# Patient Record
Sex: Female | Born: 1945 | Race: White | Hispanic: No | Marital: Married | Smoking: Never smoker
Health system: Southern US, Community
[De-identification: ages and names within clinical notes are randomized; demographics above are authoritative.]

## PROBLEM LIST (undated history)

## (undated) DIAGNOSIS — D649 Anemia, unspecified: Secondary | ICD-10-CM

## (undated) DIAGNOSIS — K219 Gastro-esophageal reflux disease without esophagitis: Secondary | ICD-10-CM

## (undated) DIAGNOSIS — I10 Essential (primary) hypertension: Secondary | ICD-10-CM

## (undated) DIAGNOSIS — T7840XA Allergy, unspecified, initial encounter: Secondary | ICD-10-CM

## (undated) DIAGNOSIS — T148XXA Other injury of unspecified body region, initial encounter: Secondary | ICD-10-CM

## (undated) DIAGNOSIS — R011 Cardiac murmur, unspecified: Secondary | ICD-10-CM

## (undated) DIAGNOSIS — H269 Unspecified cataract: Secondary | ICD-10-CM

## (undated) DIAGNOSIS — M1990 Unspecified osteoarthritis, unspecified site: Secondary | ICD-10-CM

## (undated) DIAGNOSIS — I341 Nonrheumatic mitral (valve) prolapse: Secondary | ICD-10-CM

## (undated) HISTORY — PX: TUBAL LIGATION: SHX77

## (undated) HISTORY — DX: Allergy, unspecified, initial encounter: T78.40XA

## (undated) HISTORY — DX: Nonrheumatic mitral (valve) prolapse: I34.1

## (undated) HISTORY — PX: OTHER SURGICAL HISTORY: SHX169

## (undated) HISTORY — DX: Cardiac murmur, unspecified: R01.1

## (undated) HISTORY — PX: EYE SURGERY: SHX253

## (undated) HISTORY — PX: ANTERIOR CRUCIATE LIGAMENT REPAIR: SHX115

## (undated) HISTORY — DX: Unspecified cataract: H26.9

## (undated) HISTORY — PX: BREAST BIOPSY: SHX20

---

## 1997-10-07 ENCOUNTER — Other Ambulatory Visit: Admission: RE | Admit: 1997-10-07 | Discharge: 1997-10-07 | Payer: Self-pay | Admitting: Radiology

## 1997-10-17 ENCOUNTER — Ambulatory Visit (HOSPITAL_BASED_OUTPATIENT_CLINIC_OR_DEPARTMENT_OTHER): Admission: RE | Admit: 1997-10-17 | Discharge: 1997-10-17 | Payer: Self-pay

## 1998-11-13 ENCOUNTER — Other Ambulatory Visit: Admission: RE | Admit: 1998-11-13 | Discharge: 1998-11-13 | Payer: Self-pay | Admitting: *Deleted

## 1999-07-24 ENCOUNTER — Emergency Department (HOSPITAL_COMMUNITY): Admission: EM | Admit: 1999-07-24 | Discharge: 1999-07-24 | Payer: Self-pay | Admitting: Emergency Medicine

## 1999-07-26 ENCOUNTER — Emergency Department (HOSPITAL_COMMUNITY): Admission: EM | Admit: 1999-07-26 | Discharge: 1999-07-26 | Payer: Self-pay | Admitting: Emergency Medicine

## 1999-11-11 ENCOUNTER — Other Ambulatory Visit: Admission: RE | Admit: 1999-11-11 | Discharge: 1999-11-11 | Payer: Self-pay | Admitting: *Deleted

## 2000-11-23 ENCOUNTER — Other Ambulatory Visit: Admission: RE | Admit: 2000-11-23 | Discharge: 2000-11-23 | Payer: Self-pay | Admitting: *Deleted

## 2000-12-01 ENCOUNTER — Encounter: Payer: Self-pay | Admitting: Gastroenterology

## 2000-12-01 ENCOUNTER — Encounter: Admission: RE | Admit: 2000-12-01 | Discharge: 2000-12-01 | Payer: Self-pay | Admitting: Gastroenterology

## 2001-12-12 ENCOUNTER — Other Ambulatory Visit: Admission: RE | Admit: 2001-12-12 | Discharge: 2001-12-12 | Payer: Self-pay | Admitting: *Deleted

## 2003-02-14 ENCOUNTER — Other Ambulatory Visit: Admission: RE | Admit: 2003-02-14 | Discharge: 2003-02-14 | Payer: Self-pay | Admitting: *Deleted

## 2005-03-12 ENCOUNTER — Ambulatory Visit: Payer: Self-pay

## 2005-03-25 ENCOUNTER — Ambulatory Visit: Payer: Self-pay | Admitting: Cardiology

## 2005-03-25 ENCOUNTER — Ambulatory Visit (HOSPITAL_COMMUNITY): Admission: RE | Admit: 2005-03-25 | Discharge: 2005-03-25 | Payer: Self-pay | Admitting: Surgery

## 2005-03-25 ENCOUNTER — Encounter: Payer: Self-pay | Admitting: Cardiology

## 2005-06-10 ENCOUNTER — Ambulatory Visit: Payer: Self-pay | Admitting: Cardiology

## 2005-12-21 ENCOUNTER — Encounter: Payer: Self-pay | Admitting: Cardiovascular Disease

## 2005-12-21 ENCOUNTER — Ambulatory Visit: Payer: Self-pay

## 2005-12-28 ENCOUNTER — Ambulatory Visit: Payer: Self-pay | Admitting: Cardiology

## 2006-12-06 ENCOUNTER — Ambulatory Visit: Payer: Self-pay | Admitting: Cardiology

## 2006-12-06 ENCOUNTER — Ambulatory Visit: Payer: Self-pay

## 2006-12-06 ENCOUNTER — Encounter: Payer: Self-pay | Admitting: Cardiology

## 2007-12-12 ENCOUNTER — Ambulatory Visit: Payer: Self-pay

## 2007-12-12 ENCOUNTER — Ambulatory Visit: Payer: Self-pay | Admitting: Cardiology

## 2007-12-12 ENCOUNTER — Encounter: Payer: Self-pay | Admitting: Cardiology

## 2008-12-20 DIAGNOSIS — I080 Rheumatic disorders of both mitral and aortic valves: Secondary | ICD-10-CM

## 2008-12-20 DIAGNOSIS — I059 Rheumatic mitral valve disease, unspecified: Secondary | ICD-10-CM | POA: Insufficient documentation

## 2008-12-23 ENCOUNTER — Encounter: Payer: Self-pay | Admitting: Cardiology

## 2008-12-23 ENCOUNTER — Ambulatory Visit: Payer: Self-pay

## 2008-12-23 ENCOUNTER — Ambulatory Visit: Payer: Self-pay | Admitting: Cardiology

## 2008-12-23 DIAGNOSIS — E785 Hyperlipidemia, unspecified: Secondary | ICD-10-CM

## 2009-01-29 ENCOUNTER — Encounter: Admission: RE | Admit: 2009-01-29 | Discharge: 2009-01-29 | Payer: Self-pay | Admitting: Internal Medicine

## 2009-02-18 ENCOUNTER — Telehealth (INDEPENDENT_AMBULATORY_CARE_PROVIDER_SITE_OTHER): Payer: Self-pay | Admitting: *Deleted

## 2009-12-09 ENCOUNTER — Encounter: Payer: Self-pay | Admitting: Cardiology

## 2009-12-11 ENCOUNTER — Ambulatory Visit: Payer: Self-pay | Admitting: Internal Medicine

## 2009-12-11 ENCOUNTER — Ambulatory Visit: Payer: Self-pay | Admitting: Cardiology

## 2009-12-11 ENCOUNTER — Ambulatory Visit (HOSPITAL_COMMUNITY): Admission: RE | Admit: 2009-12-11 | Discharge: 2009-12-11 | Payer: Self-pay | Admitting: Cardiology

## 2009-12-11 ENCOUNTER — Encounter: Payer: Self-pay | Admitting: Cardiology

## 2010-04-20 ENCOUNTER — Encounter: Admission: RE | Admit: 2010-04-20 | Discharge: 2010-04-20 | Payer: Self-pay | Admitting: Internal Medicine

## 2010-07-07 NOTE — Assessment & Plan Note (Signed)
Summary: yearly/sl   Visit Type:  Follow-up Primary Provider:  Dr. Robert Bellow  CC:  Mitral Regurgitation.  History of Present Illness: The patient returns for yearly followup.  Since I last saw her she has been doing quite well. 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 This is a very aerobic activity. With this she denies any shortness of breath. She has no resting complaints such as PND or orthopnea. She has no palpitations, presyncope or syncope. She has no chest pressure, neck or arm discomfort. She has had no edema or swelling.   Current Medications (verified): 1)  Evista 60 Mg Tabs (Raloxifene Hcl) .... Daily 2)  Fosamax 70 Mg Tabs (Alendronate Sodium) .... Every Week 3)  Clobex 0.05 % Sham (Clobetasol Propionate) .... Daily 4)  Allegra 180 Mg Tabs (Fexofenadine Hcl) .Marland Kitchen.. 1 By Mouth Daily  Allergies (verified): No Known Drug Allergies  Past History:  Past Medical History:  1. Mitral valve prolapse with moderate mitral regurgitation.   Past Surgical History:   Breast biopsy of benign lesions.   Anterior cruciate ligament repair.      Review of Systems       As stated in the HPI and negative for all other systems.   Vital Signs:  Patient profile:   65 year old female Height:      69 inches Weight:      129 pounds BMI:     19.12 Pulse rate:   58 / minute Resp:     16 per minute BP sitting:   118 / 78  (right arm)  Vitals Entered By: Marrion Coy, CNA (December 11, 2009 11:46 AM)  Physical Exam  General:  Well developed, well nourished, in no acute distress. Head:  normocephalic and atraumatic Neck:  Neck supple, no JVD. No masses, thyromegaly or abnormal cervical nodes. Chest Wall:  no deformities or breast masses noted Lungs:  Clear bilaterally to auscultation and percussion. Heart:  S1 and S2 within normal limits, no S3, no S4, no clicks, no rubs, 2/6 systolic murmur heard at the right upper sternal border, no diastolic  murmurs Abdomen:  Bowel sounds positive; abdomen soft and non-tender without masses, organomegaly, or hernias noted. No hepatosplenomegaly. Msk:  Back normal, normal gait. Muscle strength and tone normal. Extremities:  No clubbing or cyanosis. Neurologic:  Alert and oriented x 3. Skin:  Intact without lesions or rashes. Cervical Nodes:  no significant adenopathy Psych:  Normal affect.   EKG  Procedure date:  12/11/2009  Findings:      sinus rhythm, rate 58, axis within normal limits, intervals within normal limits, no acute ST-T wave changes.  Impression & Recommendations:  Problem # 1:  MITRAL REGURGITATION (ICD-396.3) Today's echocardiogram demonstrated moderate mitral regurgitation. Dr. Tenny Craw read this and thought that it was only marginally moderate and buy some observers could be called mild. Her ejection fraction is well-preserved. There was no significant prolapsing of the valve. No further therapy is indicated. I will see her again in one year. I will probably defer echocardiography at that visit and less her physical exam has changed. Orders: EKG w/ Interpretation (93000)  Problem # 2:  HYPERLIPIDEMIA (ICD-272.4) Per her primary physician. No change in therapy is indicated.  Patient Instructions: 1)  Your physician recommends that you schedule a follow-up appointment in: 1 yr with Dr Antoine Poche 2)  Your physician recommends that you continue on your current medications as directed. Please refer to the Current Medication  list given to you today.

## 2010-07-07 NOTE — Miscellaneous (Signed)
  Clinical Lists Changes  Observations: Added new observation of ECHOINTERP:  1. Left ventricle: The cavity size was normal. Wall thickness was        normal. Systolic function was normal. The estimated ejection        fraction was in the range of 55% to 65%.     2. Aortic valve: Trivial regurgitation.     3. Mitral valve: Moderate regurgitation.     4. Left atrium: The atrium was mildly dilated.     5. Atrial septum: No defect or patent foramen ovale was identified.     Impressions:            - No cardiac source of emboli was indentified. (12/23/2008 15:52)      Echocardiogram  Procedure date:  12/23/2008  Findings:       1. Left ventricle: The cavity size was normal. Wall thickness was        normal. Systolic function was normal. The estimated ejection        fraction was in the range of 55% to 65%.     2. Aortic valve: Trivial regurgitation.     3. Mitral valve: Moderate regurgitation.     4. Left atrium: The atrium was mildly dilated.     5. Atrial septum: No defect or patent foramen ovale was identified.     Impressions:            - No cardiac source of emboli was indentified.

## 2010-10-20 NOTE — Assessment & Plan Note (Signed)
[facility]                            CARDIOLOGY OFFICE NOTE   Carolyn, Berger                    MRN:          [id number]  DATE:12/12/2007                            DOB:          1945-10-15    PRIMARY CARE PHYSICIAN:  Jonita Albee, MD   REASON FOR PRESENTATION:  Evaluate the patient with mitral  regurgitation.   HISTORY OF PRESENT ILLNESS:  This is a yearly follow-up for this  pleasant 65 year old white female with moderate mitral regurgitation and  with mitral valve prolapse.  She has done well over the past year.  She  has had no acute cardiac symptoms.  In particular, she denies any new  shortness of breath.  She has had no decreased exercise tolerance.  She  has not noticed any palpitations.  She has not had presyncope or  syncope.  She has had no chest pain.  She denies any PND or orthopnea.  She has had no lower extremity swelling.   The preliminary echocardiogram today demonstrates a continued moderate  mitral regurgitation with perhaps slightly increased regurgitant volume.  She has a well preserved ejection fraction.  Overall, this represents  relatively stable, perhaps slightly increased mitral regurgitation.   PAST MEDICAL HISTORY:  1. Mitral valve prolapse with moderate mitral regurgitation.  2. Breast biopsy of benign lesions.  3. Anterior cruciate ligament repair.   ALLERGIES/INTOLERANCES:  LATEX and LOBSTER.   MEDICATIONS:  Evista, calcium, vitamin C, aspirin 325 mg daily,  Soriatane, and Fosamax.   REVIEW OF SYSTEMS:  As stated in the HPI and otherwise negative for  other systems.   PHYSICAL EXAMINATION:  GENERAL:  The patient is in no distress.  VITAL SIGNS:  Blood pressure 141/82, heart rate 58 and regular, weight  130 pounds, body mass index 21.  HEENT:  Eyelids unremarkable, pupils equal round and reactive to light,  fundi within normal limits, oral mucosa unremarkable.  NECK:  No jugular venous distention  at 45 degrees carotid upstroke brisk  and symmetrical, no bruits, no thyromegaly.  LYMPHATICS:  No cervical, axillary, or inguinal adenopathy.  LUNGS:  Clear to auscultation bilaterally.  BACK:  No costovertebral angle tenderness.  CHEST:  Unremarkable.  HEART:  PMI not displaced or sustained, S1 and S2 within normal limits,  no S3, no S4, no clicks, 3/6 holosystolic murmur at the apex and  radiating to the axilla and best heard on the left lateral position, no  diastolic murmurs.  ABDOMEN:  Flat, positive bowel sounds, normal in frequency and pitch, no  bruits, no rebound, no guarding, no midline pulsatile mass, no  hepatomegaly, no splenomegaly.  SKIN:  No rashes, no nodules.  EXTREMITIES:  Pulses 2+ throughout, no edema, no cyanosis, no clubbing.  NEUROLOGIC:  Oriented to person, place, and time, cranial nerves II-XII  grossly intact, motor grossly intact throughout.   EKG, sinus rhythm, rate 62, axis within normal limits, mild left atrial  enlargement, premature ventricular contractions, no acute ST-T wave  changes.   ASSESSMENT/PLAN:  1. Mitral regurgitation.  The patient's mitral regurgitation is      moderate.  It is perhaps slightly progressed compared with      previous, but still no indication at this point for need for valve      replacement or repair.  I will review the final echocardiogram      results when available.  However, I expect we will be able to      follow this again in 1 year with an echocardiogram and physical      exam.  She will come back and see me sooner if she has any symptoms      that might be related to worsening regurgitation.  2. Dyslipidemia.  I reviewed some lipids with her.  I do not have any      recent ones, but in 2006, her LDL was 140.  However, her HDL was      very high.  There is no indication for a statin, but we reviewed      more ideal lipid level.  3. Followup.  We will see her back in 1 year as above.     Rollene Rotunda, MD,  [facility]  Electronically Signed    JH/MedQ  DD: 12/12/2007  DT: 12/13/2007  Job #: [id number]   cc:   Jonita Albee, M.D.

## 2010-10-20 NOTE — Assessment & Plan Note (Signed)
[facility]                            CARDIOLOGY OFFICE NOTE   MEGIN, CONSALVO                    MRN:          [id number]  DATE:12/06/2006                            DOB:          1945/11/11    PRIMARY CARE PHYSICIAN:  Jonita Albee, M.D.   REASON FOR PRESENTATION:  Evaluate patient with mitral regurgitation.   HISTORY OF PRESENT ILLNESS:  The patient is now 65 years old. She has  done well in the past year. She still is active and doing her martial  arts. She is not quite as vigorous as she was. She thinks she has got a  little less exercise tolerance though it has been subtle. She does not  know whether this is usual aging. She does not have any acute complaints  such as dyspnea. She does have a few palpitations. She also has some  discomfort that is similar to previous reflux that she started noticing  recently. She takes a Zantac and it seems to go away. It seems to wake  her up at night. She cannot associate it with certain foods. She cannot  bring it on.   PAST MEDICAL HISTORY:  1. Previous mitral valve prolapse with mild mitral regurgitation.  2. Breast biopsy with benign lesions.  3. Anterior cruciate ligament repair.   ALLERGIES:  LATEX AND LOBSTER.   MEDICATIONS:  1. Evista.  2. Calcium.  3. Vitamin C.  4. Aspirin 325 mg daily.   REVIEW OF SYSTEMS:  As stated in the HPI and otherwise negative for  other systems.   PHYSICAL EXAMINATION:  The patient is in no distress. Blood pressure  127/68, heart rate 53 and regular.  HEENT: Eyelids unremarkable. Pupils equal, round, and reactive to light.  Fundi not visualized. Oral mucosa is unremarkable.  NECK: No jugular venous distention at 45 degrees. Carotid upstroke brisk  and symmetrical. No bruits, no thyromegaly.  LYMPHATICS: No cervical, axillary or inguinal adenopathy.  LUNGS: Clear to auscultation bilaterally.  BACK: No costovertebral angle tenderness.  CHEST:  Unremarkable.  HEART: PMI not displaced or sustained. S1, S2 within normal limits. No  S3. No S4.  There is 2/6 apical systolic murmur, holosystolic and heard  only in the left lateral position at the axilla. No diastolic murmurs.  ABDOMEN: Flat, positive bowel sounds, normal in frequency and pitch. No  bruits. No rebounds. No guarding. No midline pulsatile mass. No  hepatomegaly, splenomegaly.  SKIN: No rashes, no nodules.  EXTREMITIES: 2+ pulses. No edema.   EKG: Sinus bradycardia with premature atrial contractions. Axis within  normal limits. Intervals within normal limits. No acute ST-T wave  changes. Poor anterior R-wave progression.   ECHOCARDIOGRAM: The patient had an echocardiogram which demonstrates  well preserved ejection fraction with mild to moderate mitral  regurgitation, unchanged from previous. LV dimensions are unchanged.   ASSESSMENT/PLAN:  1. Mitral regurgitation. The patient is having no new symptoms. This      seems to be unchanged by echocardiogram and physical examination.      At this point, will follow this clinically. I would repeat an echo  in one year or sooner if she has any symptoms.  2. PACs. She is not noticing these. No further cardiovascular testing      is suggested.  3. Chest discomfort. She has some chest discomfort with similar      previous GI complaints. She had a negative stress perfusion study      in the past. At this point, I would suggest that the pre-test      probability of obstructive coronary disease as an etiology is      extremely unlikely. I have asked her to discuss this with Dr.      Perrin Maltese.  4. Followup. Will see her back in one year or sooner if needed.     Rollene Rotunda, MD, [facility]  Electronically Signed    JH/MedQ  DD: 12/06/2006  DT: 12/06/2006  Job #: [id number]   cc:   Jonita Albee, M.D.

## 2010-10-23 NOTE — Assessment & Plan Note (Signed)
[facility]                              CARDIOLOGY OFFICE NOTE   MANAAL, MANDALA                    MRN:          [id number]  DATE:12/28/2005                            DOB:          12/02/1945    PRIMARY:  Jonita Albee, MD   REASON FOR PRESENTATION:  Mitral regurgitation.   HISTORY OF PRESENT ILLNESS:  Patient is a 65 year old white female with  mitral regurgitation.  She presents for followup.  She has done very well  since I last saw her.  She has had no new shortness of breath, PND,  orthopnea, __________, palpitations, presyncope, or syncope.  She might have  a slightly decreased exercise tolerance that has been slowly progressive  over a couple of years.   PAST MEDICAL HISTORY:  1.  Previous mitral valve prolapse.  2.  Breast biopsy, benign lesions.  3.  Anterior cruciate ligament repair.   ALLERGIES:  LATEX, LOBSTER.   MEDICATIONS:  Evista, calcium, vitamin C, aspirin 325 mg daily.   REVIEW OF SYSTEMS:  As stated in the HPI, otherwise negative for other  systems.   PHYSICAL EXAMINATION:  GENERAL:  Patient is in no distress.  VITAL SIGNS:  Blood pressure 116/72, heart rate 58 and regular.  NECK:  No jugular venous distention at 45 degrees.  Carotid upstroke brisk  and symmetric.  No bruits, no thyromegaly.  LYMPHATICS:  No adenopathy.  LUNGS:  Clear to auscultation bilaterally.  CHEST:  Unremarkable.  HEART:  PMI not displaced or sustained, S1 and S2 within normal limits.  No  S3, no S4, no clicks, no rubs.  A 2/6 holosystolic murmur heard, only at the  left axilla.  No diastolic murmurs.  ABDOMEN:  Flat, positive bowel sounds.  Normal in frequency and pitch.  No  bruits, rebound, guarding.  There are no midline pulsatile masses.  No  organomegaly.  SKIN:  No rashes, no nodules.  EXTREMITIES:  Pulses 2+.   EKG:  Sinus bradycardia, rate 58, axis within normal limits, intervals  within normal limits, no acute ST-T wave  changes.   ECHOCARDIOGRAM:  The patient had an echocardiogram which demonstrated mitral  regurgitation that was moderate and unchanged.  She had a well preserved  ejection fraction.   ASSESSMENT/PLAN:  1.  Mitral regurgitation:  This is not symptomatic.  No further      cardiovascular testing was suggested at this point.  She is to come back      in one year and have repeat echocardiography at that point.  2.  Followup will be in one year or as needed.                               Rollene Rotunda, MD, [facility]    JH/MedQ  DD:  12/28/2005  DT:  12/28/2005  Job #:  [id number]   cc:   Jonita Albee, MD

## 2011-07-01 ENCOUNTER — Encounter: Payer: Self-pay | Admitting: Cardiology

## 2011-07-01 ENCOUNTER — Ambulatory Visit (INDEPENDENT_AMBULATORY_CARE_PROVIDER_SITE_OTHER): Payer: BC Managed Care – PPO | Admitting: Cardiology

## 2011-07-01 DIAGNOSIS — I059 Rheumatic mitral valve disease, unspecified: Secondary | ICD-10-CM

## 2011-07-01 DIAGNOSIS — R0600 Dyspnea, unspecified: Secondary | ICD-10-CM

## 2011-07-01 DIAGNOSIS — R0989 Other specified symptoms and signs involving the circulatory and respiratory systems: Secondary | ICD-10-CM

## 2011-07-01 NOTE — Patient Instructions (Signed)
Your physician has requested that you have an echocardiogram. Echocardiography is a painless test that uses sound waves to create images of your heart. It provides your doctor with information about the size and shape of your heart and how well your heart's chambers and valves are working. This procedure takes approximately one hour. There are no restrictions for this procedure.  Your physician has requested that you have an exercise tolerance test. For further information please visit [email or web address]. Please also follow instruction sheet, as given.  The current medical regimen is effective;  continue present plan and medications.  

## 2011-07-01 NOTE — Progress Notes (Signed)
   HPI The patient presents for followup of mitral valve prolapse.  Since I last saw her she thinks she has had some increased dyspnea with activities such as climbing stairs or doing her karate. She's had some more fatigued than previous. She can still do very strenuous activities and is a third degree black belt. She did have one episode of chest pain several weeks ago with her exercise. However, this resolved and she was able to complete her work out and has not since had this. She's not routinely getting any chest pressure, neck or arm discomfort. She's not routinely getting any palpitations, presyncope or syncope. She has no PND or orthopnea.    Not on File  Current Outpatient Prescriptions  Medication Sig Dispense Refill  . acitretin (SORIATANE) 25 MG capsule Take 25 mg by mouth daily before breakfast.      . cetirizine (ZYRTEC) 5 MG tablet Take 5 mg by mouth daily.      . Clobetasol Propionate (CLOBEX) 0.05 % shampoo Apply topically 1 day or 1 dose.      . raloxifene (EVISTA) 60 MG tablet Take 60 mg by mouth daily.        Past Medical History  Diagnosis Date  . Mitral valve prolapse     with moderate mitral regurgitation    Past Surgical History  Procedure Date  . Breast biopsy     of benign lesions  . Anterior cruciate ligament repair     ROS:  As stated in the HPI and negative for all other systems.  PHYSICAL EXAM BP 125/85  Pulse 70  Ht 5\' 5"  (1.651 m)  Wt 134 lb (60.782 kg)  BMI 22.30 kg/m2 GENERAL:  Well appearing HEENT:  Pupils equal round and reactive, fundi not visualized, oral mucosa unremarkable NECK:  No jugular venous distention, waveform within normal limits, carotid upstroke brisk and symmetric, no bruits, no thyromegaly LYMPHATICS:  No cervical, inguinal adenopathy LUNGS:  Clear to auscultation bilaterally BACK:  No CVA tenderness CHEST:  Unremarkable HEART:  PMI not displaced or sustained,S1 and S2 within normal limits, no S3, no S4, positive mid  systolic click with mild brief systolic murmur, no rubs. ABD:  Flat, positive bowel sounds normal in frequency in pitch, no bruits, no rebound, no guarding, no midline pulsatile mass, no hepatomegaly, no splenomegaly EXT:  2 plus pulses throughout, no edema, no cyanosis no clubbing SKIN:  No rashes no nodules NEURO:  Cranial nerves II through XII grossly intact, motor grossly intact throughout [facility]:  Cognitively intact, oriented to person place and time  EKG: Sinus rhythm, rate 70, axis within normal limits, intervals within normal limits, possible right atrial enlargement 07/01/2011   ASSESSMENT AND PLAN

## 2011-07-01 NOTE — Assessment & Plan Note (Signed)
I will follow up with an echocardiogram to further evaluate her MVP.  Given her symptoms I will have a low threshold for further testing such as a TEE.

## 2011-07-01 NOTE — Assessment & Plan Note (Addendum)
I doubt that this is related to any structural heart disease such as her mitral valve prolapse or obstructive coronary disease. However, I will bring her back for a maximal exercise treadmill test.

## 2011-07-06 ENCOUNTER — Ambulatory Visit (HOSPITAL_COMMUNITY): Payer: BC Managed Care – PPO | Attending: Cardiology

## 2011-07-06 DIAGNOSIS — R0609 Other forms of dyspnea: Secondary | ICD-10-CM | POA: Insufficient documentation

## 2011-07-06 DIAGNOSIS — R0989 Other specified symptoms and signs involving the circulatory and respiratory systems: Secondary | ICD-10-CM | POA: Insufficient documentation

## 2011-07-06 DIAGNOSIS — I059 Rheumatic mitral valve disease, unspecified: Secondary | ICD-10-CM | POA: Insufficient documentation

## 2011-07-06 DIAGNOSIS — R5381 Other malaise: Secondary | ICD-10-CM | POA: Insufficient documentation

## 2011-07-07 ENCOUNTER — Encounter: Payer: Self-pay | Admitting: *Deleted

## 2011-07-15 ENCOUNTER — Other Ambulatory Visit: Payer: Self-pay | Admitting: Cardiology

## 2011-07-15 DIAGNOSIS — I340 Nonrheumatic mitral (valve) insufficiency: Secondary | ICD-10-CM

## 2011-07-20 ENCOUNTER — Encounter: Payer: BC Managed Care – PPO | Admitting: Cardiology

## 2011-07-21 ENCOUNTER — Encounter: Payer: BC Managed Care – PPO | Admitting: Cardiology

## 2011-07-23 ENCOUNTER — Encounter (HOSPITAL_COMMUNITY)
Admission: RE | Disposition: A | Discharge status: Home / Self Care | Payer: Self-pay | Source: Ambulatory Visit | Attending: Cardiology

## 2011-07-23 ENCOUNTER — Encounter (HOSPITAL_COMMUNITY): Payer: Self-pay

## 2011-07-23 ENCOUNTER — Ambulatory Visit (HOSPITAL_COMMUNITY)
Admission: RE | Admit: 2011-07-23 | Discharge: 2011-07-23 | Disposition: A | Discharge status: Home / Self Care | Payer: BC Managed Care – PPO | Source: Ambulatory Visit | Attending: Cardiology | Admitting: Cardiology

## 2011-07-23 DIAGNOSIS — I340 Nonrheumatic mitral (valve) insufficiency: Secondary | ICD-10-CM

## 2011-07-23 DIAGNOSIS — I059 Rheumatic mitral valve disease, unspecified: Secondary | ICD-10-CM

## 2011-07-23 HISTORY — PX: TEE WITHOUT CARDIOVERSION: SHX5443

## 2011-07-23 SURGERY — ECHOCARDIOGRAM, TRANSESOPHAGEAL
Anesthesia: Moderate Sedation

## 2011-07-23 MED ORDER — MIDAZOLAM HCL 10 MG/2ML IJ SOLN
INTRAMUSCULAR | Status: AC
  Filled 2011-07-23: qty 2

## 2011-07-23 MED ORDER — BUTAMBEN-TETRACAINE-BENZOCAINE 2-2-14 % EX AERO
INHALATION_SPRAY | CUTANEOUS | Status: DC | PRN
  Administered 2011-07-23: 2 via TOPICAL

## 2011-07-23 MED ORDER — FENTANYL CITRATE 0.05 MG/ML IJ SOLN: 250.0000 ug | Freq: Once | INTRAMUSCULAR | Status: DC

## 2011-07-23 MED ORDER — SODIUM CHLORIDE 0.9 % IV SOLN: 250.0000 mL | INTRAVENOUS | Status: DC | PRN

## 2011-07-23 MED ORDER — FENTANYL CITRATE 0.05 MG/ML IJ SOLN
INTRAMUSCULAR | Status: AC
  Filled 2011-07-23: qty 2

## 2011-07-23 MED ORDER — FENTANYL CITRATE 0.05 MG/ML IJ SOLN
INTRAMUSCULAR | Status: DC | PRN
  Administered 2011-07-23 (×2): 25 ug via INTRAVENOUS

## 2011-07-23 MED ORDER — MIDAZOLAM HCL 10 MG/2ML IJ SOLN
INTRAMUSCULAR | Status: DC | PRN
  Administered 2011-07-23: 2 mg via INTRAVENOUS
  Administered 2011-07-23: 1 mg via INTRAVENOUS
  Administered 2011-07-23: 2 mg via INTRAVENOUS

## 2011-07-23 MED ORDER — SODIUM CHLORIDE 0.45 % IV SOLN
INTRAVENOUS | Status: DC
  Administered 2011-07-23: 500 mL via INTRAVENOUS

## 2011-07-23 MED ORDER — BENZOCAINE 20 % MT SOLN: 1.0000 "application " | OROMUCOSAL | Status: DC | PRN

## 2011-07-23 MED ORDER — MIDAZOLAM HCL 10 MG/2ML IJ SOLN: 10.0000 mg | Freq: Once | INTRAMUSCULAR | Status: DC

## 2011-07-23 NOTE — Progress Notes (Signed)
  Echocardiogram 2D Echocardiogram has been performed.  Carolyn Berger Nira Retort 07/23/2011, 2:17 PM

## 2011-07-23 NOTE — Op Note (Signed)
Procedure: TEE  Indication: Evaluate mitral regurgitation  Sedation: Versed 5 mg IV, Fentanyl 50 mcg IV  Findings: See TEE report in echo section for full details.  LV EF 60% with normal wall motion.  Normal size LV.  The posterior leaflet of the MV was thickened without discrete MV prolapse.  There was moderate mitral regurgitation visually and by PISA evaluation. Normal RV size and systolic function.   No complications.

## 2011-07-23 NOTE — Discharge Instructions (Addendum)
Mitral Valve Prolapse The mitral valve is located between the top and bottom parts of the heart on the left side. A mitral valve prolapse (MVP) is an abnormal bulging of 1 or both of the 2 mitral leaflets. The valve bulges into the top chamber (atrium) of the heart when the bottom chamber (ventricle) squeezes or contracts. MVP is more common in females. It is an inherited problem and is usually not found until adolescence. It is not harmful and rarely needs other treatment. PROBLEMS MAY INCLUDE:  Chest pain.  Palpitations. Mitral Valve Prolapse The mitral valve is located between the top and bottom parts of the heart on the left side. A mitral valve prolapse (MVP) is an abnormal bulging of 1 or both of the 2 mitral leaflets. The valve bulges into the top chamber (atrium) of the heart when the bottom chamber (ventricle) squeezes or contracts. MVP is more common in females. It is an inherited problem and is usually not found until adolescence. It is not harmful and rarely needs other treatment. PROBLEMS MAY INCLUDE: Chest pain.  Palpitations.  Anxiety.  Panic attacks.  Stroke, rarely.  HOME CARE INSTRUCTIONS  Taking antibiotics before a dental or other medical procedure is no longer routine. Consult with your caregiver.  Exercise as your caregiver instructs.  Discuss cardiac risk factors associated with MVP with your caregiver.  SEEK IMMEDIATE MEDICAL CARE IF:  You develop frequent episodes of chest pain or an irregular heartbeat.  You faint or pass out.  You have severe chest pain or shortness of breath.  You develop palpitations with weakness or dizziness.  You have difficulty with vision or swallowing or weakness or numbness on one side of your body.  MAKE SURE YOU:  Understand these instructions.  Will watch your condition.  Will get help right away if you are not doing well or get worse.  Document Released: 05/21/2000 Document Revised: 02/03/2011 Document Reviewed:  07/21/2007  [facility] Patient Information 2012 [place], [place].  Anxiety.   Panic attacks.   Stroke, rarely.  HOME CARE INSTRUCTIONS   Taking antibiotics before a dental or other medical procedure is no longer routine. Consult with your caregiver.   Exercise as your caregiver instructs.   Discuss cardiac risk factors associated with MVP with your caregiver.  SEEK IMMEDIATE MEDICAL CARE IF:   You develop frequent episodes of chest pain or an irregular heartbeat.   You faint or pass out.   You have severe chest pain or shortness of breath.   You develop palpitations with weakness or dizziness.   You have difficulty with vision or swallowing or weakness or numbness on one side of your body.  MAKE SURE YOU:   Understand these instructions.   Will watch your condition.   Will get help right away if you are not doing well or get worse.  Document Released: 05/21/2000 Document Revised: 02/03/2011 Document Reviewed: 07/21/2007 ExitCare Patient Information 2012 ExitCare, [place] Valve Problems General Information Heart valves open to allow blood to be pumped forward. They close to prevent fluid from flowing backward. Human heart valves are formed by flaps of tissue called leaflets or cusps. The heart has 4 valves:  Aortic.   Mitral.   Tricuspid.   Pulmonary.  HEART-VALVE PROBLEMS FALL INTO TWO CATEGORIES:   Stenosis. The opening of the valve is too narrow. This interferes with the forward flow of blood.   Regurgitation. The valve does not close properly. It leaks, sometimes causing a significant backflow of blood.  CONGENITAL AND  ACQUIRED PROBLEMS Heart-valve problems can be congenital. This means present at birth. They can also be acquired following birth. Congenital heart-valve disease affects about one in 1,000 newborns. The majority of these infants have narrowing of either the pulmonary or aortic valve. The exact cause of heart defects at birth is not known. Heart  defects seem to run in families. Because of this, it is felt that there is a genetic (inherited) cause.  In 2 percent to 4 percent of heart valve problems, the heart defect is related to health or environmental factors that affected the mother during pregnancy. These include:  Diabetes.   Phenylketonuria (a rare disease of metabolism).   Rubella ("[place] measles").   Systemic lupus erythematosus.   Drugs taken by the mother:   Alcohol.   Street drugs.   Lithium.   Certain seizure medications.  Your caregiver can explain any medical conditions listed which may apply to you. A heart-valve problem is acquired if it happens in a valve that was normal at birth. Some common causes of acquired heart-valve problems include:  Rheumatic fever - An illness that may follow an untreated strep throat infection.   Endocarditis - Infection of the heart valves.   Idiopathic calcific aortic stenosis - A worsening condition seen in the elderly, in which the aortic valve becomes:   Thickened.   Fused.   Full of calcium deposits.   Syphilis.   High blood pressure.   Arteriosclerosis (blood vessels thickening and losing elasticity).   Connective-tissue disorders - Such as Marfan's syndrome (a rare inherited problem).  Your caregiver can explain any medical conditions listed that may apply to you. HEART-VALVE PROBLEMS AFFECT EACH VALVE IN SLIGHTLY DIFFERENT WAYS. Aortic Valve The aortic valve opens to allow blood to pass from the left ventricle to the aorta. This large blood vessel leads oxygenated blood (blood that is rich in oxygen after passing through the lungs) from the heart to the rest of the body. Disorders of this valve include:  Congenital aortic stenosis - The problem is almost always a valve that has 2 flaps instead of the usual 3 (bicuspid aortic valve).   In about 10 percent of affected newborns, the aortic valve is so narrow that the child develops severe symptoms in the  first year of life.   In the remaining 90 percent, aortic stenosis is discovered during a physical examination.   Acquired aortic stenosis - Aortic stenosis accounts for 25 percent of all heart valve problems in adults. 80 percent of patients are female. In adulthood, aortic stenosis usually is caused by:   Rheumatic fever.   Idiopathic calcific aortic stenosis (a condition where calcium deposits narrow the opening at the valve).   Aortic regurgitation - The aortic valve does not close properly. This allows for blood to flow backward into the left ventricle. This decreases the forward flow of oxygenated blood through the aorta. Backflow also stretches the ventricle out of shape. In adults, about two-thirds of cases are caused by rheumatic fever. 75 percent of patients are female.  Mitral Valve The mitral valve opens to allow blood to pass from the left atrium to the left ventricle. Disorders of this valve include:  Mitral stenosis - The common adult patient is a woman whose mitral valve was damaged by rheumatic fever. In many cases, the rheumatic fever is mild, and it is hard to know exactly when this problem originally happened.   Mitral regurgitation - The most common conditions causing mitral regurgitation are:  Mitral valve prolapse. The leaflets of the mitral valve fail to close properly. It tends to affect women between the ages of 36 and 37. The underlying cause is unknown. Most patients never have symptoms.   Rheumatic fever.   Infective endocarditis (infection that gets to the inner walls of the heart and the valves).   Buildup of calcium deposits on the valve.   Blockages in the coronary arteries and other blood vessels that provide oxygen and blood to the heart muscle.  Pulmonary Valve The pulmonary valve is located between the right ventricle and the pulmonary artery (the artery that takes blood from the heart to the lungs). Blood passes through this valve to allow oxygen-poor  blood to flow from the right side of the heart to the lungs for oxygenation. Disorders of this valve include:  Congenital pulmonic stenosis - Infants develop heart failure or cyanosis (a bluish color to the lips, fingernails and skin) within the first month of life. In most cases the valve is deformed. Two or three leaflets are partially fused.   Adult disorders of the pulmonic valve - The pulmonic valve most often is damaged because of pulmonary hypertension (high pressure within the blood vessels in the lungs). This condition is usually related to chronic obstructive pulmonary disease or emphysemsa.  Tricuspid Valve The tricuspid valve allows blood to flow from the right atrium to the right ventricle. Disorders of this valve include:  Tricuspid stenosis - This usually is caused by an episode of rheumatic fever. This fever often damages the mitral valve at the same time. This condition is rare in [place] and [place].   Tricuspid regurgitation - Usually occurs because of pulmonary hypertension. Also can be caused by:   Heart failure.   Trauma   Endocarditis   Myocardial infarction ("heart attack").  SYMPTOMS  Many patients with mild heart-valve problems have no cardiac symptoms. The abnormal valve is discovered only when a heart murmur is heard during an exam. For more severe heart-valve problems, symptoms vary slightly according to the specific valve involved.  Congenital heart-valve problems - Severe valve narrowing can cause:  Cyanosis (a bluish coloring to the skin).   Symptoms of heart failure.   Aortic stenosis - Aortic stenosis usually does not produce symptoms until the valve opening narrows to about one-third of normal. This generally happens between the ages of 32 and 43. Symptoms include:   Shortness of breath during exercise.   Heart-related chest pain.   Fainting spells.  Aortic regurgitation - A patient can have significant aortic regurgitation for 10 to 15 years  without developing significant symptoms. When symptoms begin, there may be:  Palpitations.   Cardiac arrhythmias.   Shortness of breath during exercise.   Breathlessness while lying down.   Sudden and severe shortness of breath during the middle of the night.   Sweating.   Chest pains and symptoms of heart failure as outlined above.  Mitral stenosis - Symptoms include:  Shortness of breath during exercise.   Sudden and severe shortness of breath during the middle of the night.   Abnormal heart rhythms, especially atrial fibrillation.   Coughing up blood.  In some patients, blood clots form in the left atrium. These clots can travel through blood vessels and damage the brain, spleen or kidneys.  Mitral regurgitation - Symptoms include:  Fatigue.   Shortness of breath on exertion.   Breathlessness while lying down.  Pulmonic-valve problems - Symptoms include:  Fatigue.   Fainting spells.  Symptoms of heart failure.  Tricuspid stenosis - This usually causes fatigue and symptoms of heart failure. Many patients have symptoms of mitral stenosis at the same time.  Tricuspid regurgitation - This primarily causes symptoms of heart failure, especially heart-related breathing problems.  DIAGNOSIS  If you are having symptoms, your caregiver will begin by learning about your risk of heart valve problems. Your caregiver will ask questions about:  Family history of heart problems.   Your personal history of rheumatic fever.   Syphilis.   Hypertension.   Arteriosclerosis or connective-tissue disorders.   Your risk of endocarditis caused by:   Intravenous (IV) drug use.   A recent medical or dental procedure (problems after dental procedures are rare).   In infants the mother's health or environmental risk factors during pregnancy.  Your caregiver may suspect that you have a heart-valve problem based on your:  Specific symptoms.   Medical history.  Your caregiver  will perform a physical examination with special attention to your heart.  Your caregiver will order diagnostic tests. These may include:  An electrocardiogram (EKG).   A chest X-ray.   Blood tests to check for infection in patients with suspected endocarditis.   An echocardiogram.   Doppler echocardiography.   Cardiac catheterization.  In people who do not have any symptoms, diagnostic testing may become necessary after the caregiver discovers a new heart murmur during a routine physical exam. EXPECTED DURATION In general, heart-valve problems:  Continue throughout life.   May gradually worsen with time.  Those caused by endocarditis sometimes may produce severe symptoms and rapid worsening within a few days. PREVENTION  Currently, there is no way to prevent the majority of congenital heart-valve problems. Pregnant women should have regularly scheduled prenatal care and should avoid using alcohol. You can prevent many acquired heart-valve problems by preventing rheumatic fever. If you have any condition requiring treatment with antibiotics, take them exactly as prescribed.  TREATMENT   If you have a mild heart-valve problem without any symptoms, your caregiver may simply watch over your condition.   If you have moderate or severe symptoms, your treatment will be decided by:   How severe your symptoms are.   The results of diagnostic tests.  Your caregiver can give you medications to temporarily treat symptoms such as:   Angina.   Cardiac arrhythmias.   Heart failure.  You eventually may need to have the abnormal valve repaired or replaced. This can be done in several different ways:   Percutaneous balloon valvoplasty (for stenosis) - A tiny catheter (flexible tube) with a balloon at its tip is passed through the narrowed heart valve. The tiny balloon then is inflated and pulled back through the narrowed valve to widen it.   Valvotomy using traditional surgery (for  stenosis) - The surgeon opens the heart and separates valve leaflets that are fused together.   Valve replacement - Defective heart valves can be replaced with a mechanical heart valve made of:   Plastic.   Dacron.   Other material.  Defective heart valves can also be replaced with a biological valve made of tissue taken from:  A pig.   A cow.   A deceased human donor.  After surgery, patients with mechanical valves must take medications to prevent blood clots. If you have been diagnosed with a heart-valve problem, ask your caregiver if you are at risk of endocarditis. If so, you will need to take antibiotics before any medical or dental procedure in which bacteria  may enter your blood and infect your abnormal valve.  SEEK IMMEDIATE MEDICAL CARE IF:  You experience any symptoms that may be related to a heart problem, especially:  Shortness of breath.   Chest pain.   Rapid or irregular heartbeat.   Fainting spells.  Document Released: 08/14/2003 Document Revised: 02/03/2011 Document Reviewed: 04/13/2007 Exit[employer] Patient Information 2012 ExitCare, LLCTransesophageal Echocardiography A transesophageal echocardiogram (TEE) is a special type of test that produces images of the heart by sound waves (echocardiogram). This type of echocardiogram can obtain better images of the heart than a standard echocardiogram. A TEE is done by passing a flexible tube down the esophagus. The heart is located in front of the esophagus. Because the heart and esophagus are close to one another, your caregiver can take very clear, detailed pictures of the heart via ultrasound waves. WHY HAVE A TEE? Your caregiver may need more information based on your medical condition. A TEE is usually performed due to the following:  Your caregiver needs more information based on standard echocardiogram findings.   If you had a stroke, this might have happened because a clot formed in your heart. A TEE can visualize  different areas of the heart and check for clots.   To check valve anatomy and function. Your caregiver will especially look at the mitral valve.   To check for redness, soreness, and swelling (inflammation) on the inside lining of the heart (endocarditis).   To evaluate the dividing wall (septum) of the heart and presence of a hole that did not close after birth (patent foramen ovale, PFO).   To help diagnose a tear in the wall of the aorta (aortic dissection).   During cardiac valve surgery, a TEE probe is placed. This allows the surgeon to assess the valve repair before closing the chest.  LET YOUR CAREGIVER KNOW ABOUT:   Swallowing difficulties.   An esophageal obstruction.   Use of aspirin or antiplatelet therapy.  RISKS AND COMPLICATIONS  Though extremely rare, an esophageal tear (rupture) is a potential complication. BEFORE THE PROCEDURE   Arrive at least 1 hour before the procedure or as told by your caregiver.   Do not eat or drink for 6 hours before the procedure or as told by your caregiver.   An intravenous (IV) access tube will be started in the arm.  PROCEDURE   A medicine to help you relax (sedative) will be given through the IV.   A medicine that numbs the area (local anesthetic) may be sprayed to the back of the throat.   Your blood pressure, heart rate, and breathing (vital signs) will be monitored during the procedure.   The TEE probe is a long, flexible tube. It is about the width of an adult female's index finger. The tip of the probe is placed into the back of the mouth and you will be asked to swallow. This helps to pass the tip of the probe into the esophagus. Once the tip of the probe is in the correct area, your caregiver can take pictures of the heart.   A TEE is usually not a painful procedure. You may feel the probe press against the back of the throat. The probe does not enter the trachea and does not affect your breathing.   Your time spent at the  hospital is usually less than 2 hours.  AFTER THE PROCEDURE   You will be in bed, resting until you have fully returned to consciousness.   When you first  awaken, your throat may feel slightly sore and will probably still feel numb. This will improve slowly over time.   You will not be allowed to eat or drink until it is clear that numbness has improved.   Once you have been able to drink, urinate, and sit on the edge of the bed without feeling sick to your stomach (nauseous) or dizzy, you may be cleared to dress and go home.   Do not drive yourself home. You have had medications that can continue to make you feel drowsy and can impair your reflexes.   You should have a friend or family member with you for the next 24 hours after your examination.  Obtaining the test results It is your responsibility to obtain your test results. Ask the lab or department performing the test when and how you will get your results. SEEK IMMEDIATE MEDICAL CARE IF:   There is chest pain.   You have a hard time breathing or have shortness of breath.   You cough or throw up (vomit) blood.  MAKE SURE YOU:   Understand these instructions.   Will watch this condition.   Will get help right away if you is not doing well or gets worse.  Document Released: 08/14/2002 Document Revised: 02/03/2011 Document Reviewed: 11/05/2008 [facility] Patient Information 2012 [place], [place].Marland KitchenC.

## 2011-07-23 NOTE — Interval H&P Note (Signed)
History and Physical Interval Note:  07/23/2011 11:24 AM  Carolyn Berger  has presented today for surgery, with the diagnosis of a fib  The various methods of treatment have been discussed with the patient and family. After consideration of risks, benefits and other options for treatment, the patient has consented to  Procedure(s) (LRB): TRANSESOPHAGEAL ECHOCARDIOGRAM (TEE) (N/A) as a surgical intervention .  The patients' history has been reviewed, patient examined, no change in status, stable for surgery.  I have reviewed the patients' chart and labs.  Questions were answered to the patient's satisfaction.     Iris Hairston [employer]

## 2011-07-23 NOTE — H&P (View-Only) (Signed)
   HPI The patient presents for followup of mitral valve prolapse.  Since I last saw her she thinks she has had some increased dyspnea with activities such as climbing stairs or doing her karate. She's had some more fatigued than previous. She can still do very strenuous activities and is a third degree black belt. She did have one episode of chest pain several weeks ago with her exercise. However, this resolved and she was able to complete her work out and has not since had this. She's not routinely getting any chest pressure, neck or arm discomfort. She's not routinely getting any palpitations, presyncope or syncope. She has no PND or orthopnea.    Not on File  Current Outpatient Prescriptions  Medication Sig Dispense Refill  . acitretin (SORIATANE) 25 MG capsule Take 25 mg by mouth daily before breakfast.      . cetirizine (ZYRTEC) 5 MG tablet Take 5 mg by mouth daily.      . Clobetasol Propionate (CLOBEX) 0.05 % shampoo Apply topically 1 day or 1 dose.      . raloxifene (EVISTA) 60 MG tablet Take 60 mg by mouth daily.        Past Medical History  Diagnosis Date  . Mitral valve prolapse     with moderate mitral regurgitation    Past Surgical History  Procedure Date  . Breast biopsy     of benign lesions  . Anterior cruciate ligament repair     ROS:  As stated in the HPI and negative for all other systems.  PHYSICAL EXAM BP 125/85  Pulse 70  Ht 5' 5" (1.651 m)  Wt 134 lb (60.782 kg)  BMI 22.30 kg/m2 GENERAL:  Well appearing HEENT:  Pupils equal round and reactive, fundi not visualized, oral mucosa unremarkable NECK:  No jugular venous distention, waveform within normal limits, carotid upstroke brisk and symmetric, no bruits, no thyromegaly LYMPHATICS:  No cervical, inguinal adenopathy LUNGS:  Clear to auscultation bilaterally BACK:  No CVA tenderness CHEST:  Unremarkable HEART:  PMI not displaced or sustained,S1 and S2 within normal limits, no S3, no S4, positive mid  systolic click with mild brief systolic murmur, no rubs. ABD:  Flat, positive bowel sounds normal in frequency in pitch, no bruits, no rebound, no guarding, no midline pulsatile mass, no hepatomegaly, no splenomegaly EXT:  2 plus pulses throughout, no edema, no cyanosis no clubbing SKIN:  No rashes no nodules NEURO:  Cranial nerves II through XII grossly intact, motor grossly intact throughout PSYCH:  Cognitively intact, oriented to person place and time  EKG: Sinus rhythm, rate 70, axis within normal limits, intervals within normal limits, possible right atrial enlargement 07/01/2011   ASSESSMENT AND PLAN  

## 2011-07-26 ENCOUNTER — Encounter (HOSPITAL_COMMUNITY): Payer: Self-pay | Admitting: Cardiology

## 2011-08-02 ENCOUNTER — Telehealth: Payer: Self-pay | Admitting: Cardiology

## 2011-08-02 NOTE — Telephone Encounter (Signed)
New Msg: Pt calling wanting to speak with MD regarding results of pt test. Please return pt call to discuss further.

## 2011-08-03 NOTE — Telephone Encounter (Signed)
Left message for pt to call back to discuss results. 

## 2011-08-13 NOTE — Telephone Encounter (Signed)
Pt is wanting to discuss TEE results from 2/15.  Will continue to attempt to contact pt.  She does not have a follow up appointment.  Will forward to Dr Antoine Poche to see if there are any orders for her.

## 2011-08-17 ENCOUNTER — Telehealth: Payer: Self-pay | Admitting: Cardiology

## 2011-08-17 NOTE — Telephone Encounter (Signed)
For Dr Antoine Poche to call pt.

## 2011-08-17 NOTE — Telephone Encounter (Signed)
Per Dr Antoine Poche - he has attempted to contact pt - has leave messages several times

## 2011-08-17 NOTE — Telephone Encounter (Signed)
Fu call °Patient returning your call °

## 2012-05-30 ENCOUNTER — Encounter (INDEPENDENT_AMBULATORY_CARE_PROVIDER_SITE_OTHER): Payer: Self-pay

## 2012-09-25 ENCOUNTER — Encounter: Payer: Self-pay | Admitting: *Deleted

## 2012-09-25 ENCOUNTER — Ambulatory Visit (INDEPENDENT_AMBULATORY_CARE_PROVIDER_SITE_OTHER): Payer: BC Managed Care – PPO | Admitting: Cardiology

## 2012-09-25 ENCOUNTER — Encounter: Payer: Self-pay | Admitting: Cardiology

## 2012-09-25 ENCOUNTER — Encounter: Payer: Self-pay | Admitting: Internal Medicine

## 2012-09-25 VITALS — BP 134/78 | HR 62 | Ht 65.0 in | Wt 140.0 lb

## 2012-09-25 DIAGNOSIS — I059 Rheumatic mitral valve disease, unspecified: Secondary | ICD-10-CM

## 2012-09-25 DIAGNOSIS — I080 Rheumatic disorders of both mitral and aortic valves: Secondary | ICD-10-CM

## 2012-09-25 NOTE — Patient Instructions (Addendum)
Your physician wants you to follow-up in: 12 months with Dr. Hochrein. You will receive a reminder letter in the mail two months in advance. If you don't receive a letter, please call our office to schedule the follow-up appointment.  

## 2012-09-25 NOTE — Progress Notes (Signed)
    HPI The patient presents for followup of mitral valve prolapse.  Since I last saw her she has had no new symptoms. He still doing her karate. The patient denies any new symptoms such as chest discomfort, neck or arm discomfort. There has been no new shortness of breath, PND or orthopnea. There have been no reported palpitations, presyncope or syncope.  Allergies  Allergen Reactions  . Typhoid Vaccines Rash    Current Outpatient Prescriptions  Medication Sig Dispense Refill  . acitretin (SORIATANE) 25 MG capsule Take 25 mg by mouth daily before breakfast.      . cetirizine (ZYRTEC) 5 MG tablet Take 5 mg by mouth daily.      . Clobetasol Propionate (CLOBEX) 0.05 % shampoo Apply topically 1 day or 1 dose.      Marland Kitchen glucosamine-chondroitin 500-400 MG tablet Take 1 tablet by mouth once.      . raloxifene (EVISTA) 60 MG tablet Take 60 mg by mouth daily.       No current facility-administered medications for this visit.    Past Medical History  Diagnosis Date  . Mitral valve prolapse     with moderate mitral regurgitation    Past Surgical History  Procedure Laterality Date  . Anterior cruciate ligament repair    . Breast biopsy      of benign lesions  . Minicus    . Tee without cardioversion  07/23/2011    Procedure: TRANSESOPHAGEAL ECHOCARDIOGRAM (TEE);  Surgeon: Marca Ancona, MD;  Location: [facility] ENDOSCOPY;  Service: Cardiovascular;  Laterality: N/A;    ROS:  As stated in the HPI and negative for all other systems.  PHYSICAL EXAM BP 134/78  Pulse 62  Ht 5\' 5"  (1.651 m)  Wt 140 lb (63.504 kg)  BMI 23.3 kg/m2 GENERAL:  Well appearing NECK:  No jugular venous distention, waveform within normal limits, carotid upstroke brisk and symmetric, no bruits, no thyromegaly LUNGS:  Clear to auscultation bilaterally CHEST:  Unremarkable HEART:  PMI not displaced or sustained,S1 and S2 within normal limits, no S3, no S4, positive mid systolic click with mild brief systolic murmur, no  rubs. ABD:  Flat, positive bowel sounds normal in frequency in pitch, no bruits, no rebound, no guarding, no midline pulsatile mass, no hepatomegaly, no splenomegaly EXT:  2 plus pulses throughout, no edema, no cyanosis no clubbing   ASSESSMENT AND PLAN   MITRAL REGURGITATION:  The patient has had stable mitral regurgitation for years. I would not suspect this was changed clinically. No change in therapy is indicated. No further imaging this year is indicated. I will continue to follow this clinically.

## 2012-10-02 ENCOUNTER — Encounter: Payer: Self-pay | Admitting: Internal Medicine

## 2012-10-02 ENCOUNTER — Ambulatory Visit (INDEPENDENT_AMBULATORY_CARE_PROVIDER_SITE_OTHER): Payer: BC Managed Care – PPO | Admitting: Internal Medicine

## 2012-10-02 VITALS — HR 56 | Temp 98.4°F | Resp 16 | Ht 65.0 in | Wt 137.8 lb

## 2012-10-02 DIAGNOSIS — L409 Psoriasis, unspecified: Secondary | ICD-10-CM

## 2012-10-02 DIAGNOSIS — R011 Cardiac murmur, unspecified: Secondary | ICD-10-CM

## 2012-10-02 DIAGNOSIS — Z0000 Encounter for general adult medical examination without abnormal findings: Secondary | ICD-10-CM

## 2012-10-02 DIAGNOSIS — L408 Other psoriasis: Secondary | ICD-10-CM

## 2012-10-02 DIAGNOSIS — Z79899 Other long term (current) drug therapy: Secondary | ICD-10-CM

## 2012-10-02 LAB — POCT URINALYSIS DIPSTICK
Ketones, UA: NEGATIVE
Leukocytes, UA: NEGATIVE
Nitrite, UA: NEGATIVE
Protein, UA: NEGATIVE

## 2012-10-02 LAB — COMPREHENSIVE METABOLIC PANEL
ALT: 12 U/L (ref 0–35)
CO2: 25 mEq/L (ref 19–32)
Calcium: 9.7 mg/dL (ref 8.4–10.5)
Chloride: 106 mEq/L (ref 96–112)
Sodium: 141 mEq/L (ref 135–145)
Total Protein: 7.1 g/dL (ref 6.0–8.3)

## 2012-10-02 LAB — CBC WITH DIFFERENTIAL/PLATELET
Basophils Absolute: 0 10*3/uL (ref 0.0–0.1)
Eosinophils Relative: 1 % (ref 0–5)
Lymphocytes Relative: 35 % (ref 12–46)
Neutro Abs: 3.8 10*3/uL (ref 1.7–7.7)
Platelets: 208 10*3/uL (ref 150–400)
RDW: 13.5 % (ref 11.5–15.5)
WBC: 6.8 10*3/uL (ref 4.0–10.5)

## 2012-10-02 LAB — LIPID PANEL
Cholesterol: 248 mg/dL — ABNORMAL HIGH (ref 0–200)
LDL Cholesterol: 153 mg/dL — ABNORMAL HIGH (ref 0–99)
Triglycerides: 149 mg/dL (ref <150)

## 2012-10-02 NOTE — Patient Instructions (Signed)
Psoriasis Psoriasis is a common, long-lasting (chronic) inflammation of the skin. It affects both men and women equally, of all ages and all races. Psoriasis cannot be passed from person to person (not contagious). Psoriasis varies from mild to very severe. When severe, it can greatly affect your quality of life. Psoriasis is an inflammatory disorder affecting the skin as well as other organs including the joints (causing an arthritis). With psoriasis, the skin sheds its top layer of cells more rapidly than it does in someone without psoriasis. CAUSES  The cause of psoriasis is largely unknown. Genetics, your immune system, and the environment seem to play a role in causing psoriasis. Factors that can make psoriasis worse include:  Damage or trauma to the skin, such as cuts, scrapes, and sunburn. This damage often causes new areas of psoriasis (lesions).  Winter dryness and lack of sunlight.  Medicines such as lithium, beta-blockers, antimalarial drugs, ACE inhibitors, nonsteroidal anti-inflammatory drugs (ibuprofen, aspirin), and terbinafine. Let your caregiver know if you are taking any of these drugs.  Alcohol. Excessive alcohol use should be avoided if you have psoriasis. Drinking large amounts of alcohol can affect:  How well your psoriasis treatment works.  How safe your psoriasis treatment is.  Smoking. If you smoke, ask your caregiver for help to quit.  Stress.  Bacterial or viral infections.  Arthritis. Arthritis associated with psoriasis (psoriatic arthritis) affects less than 10% of patients with psoriasis. The arthritic intensity does not always match the skin psoriasis intensity. It is important to let your caregiver know if your joints hurt or if they are stiff. SYMPTOMS  The most common form of psoriasis begins with little red bumps that gradually become larger. The bumps begin to form scales that flake off easily. The lower layers of scales stick together. When these scales  are scratched or removed, the underlying skin is tender and bleeds easily. These areas then grow in size and may become large. Psoriasis often creates a rash that looks the same on both sides of the body (symmetrical). It often affects the elbows, knees, groin, genitals, arms, legs, scalp, and nails. Affected nails often have pitting, loosen, thicken, crumble, and are difficult to treat.  "Inverse psoriasis"occurs in the armpits, under breasts, in skin folds, and around the groin, buttocks, and genitals.  "Guttate psoriasis" generally occurs in children and young adults following a recent sore throat (strep throat). It begins with many small, red, scaly spots on the skin. It clears spontaneously in weeks or a few months without treatment. DIAGNOSIS  Psoriasis is diagnosed by physical exam. A tissue sample (biopsy) may also be taken. TREATMENT The treatment of psoriasis depends on your age, health, and living conditions.  Steroid (cortisone) creams, lotions, and ointments may be used. These treatments are associated with thinning of the skin, blood vessels that get larger (dilated), loss of skin pigmentation, and easy bruising. It is important to use these steroids as directed by your caregiver. Only treat the affected areas and not the normal, unaffected skin. People on long-term steroid treatment should wear a medical alert bracelet. Injections may be used in areas that are difficult to treat.  Scalp treatments are available as shampoos, solutions, sprays, foams, and oils. Avoid scratching the scalp and picking at the scales.  Anthralin medicine works well on areas that are difficult to treat. However, it stains clothes and skin and may cause temporary irritation.  Synthetic vitamin D (calcipotriene)can be used on small areas. It is available by prescription. The forms   of synthetic vitamin D available in health food stores do not help with psoriasis.  Coal tarsare available in various strengths  for psoriasis that is difficult to treat. They are one of the longest used treatments for difficult to treat psoriasis. However, they are messy to use.  Light therapy (UV therapy) can be carefully and professionally monitored in a dermatologist's office. Careful sunbathing is helpful for many people as directed by your caregiver. The exposure should be just long enough to cause a mild redness (erythema) of your skin. Avoid sunburn as this may make the condition worse. Sunscreen (SPF of 30 or higher) should be used to protect against sunburn. Cataracts, wrinkles, and skin aging are some of the harmful side effects of light therapy.  If creams (topical medicines) fail, there are several other options for systemic or oral medicines your caregiver can suggest. Psoriasis can sometimes be very difficult to treat. It can come and go. It is necessary to follow up with your caregiver regularly if your psoriasis is difficult to treat. Usually, with persistence you can get a good amount of relief. Maintaining consistent care is important. Do not change caregivers just because you do not see immediate results. It may take several trials to find the right combination of treatment for you. PREVENTING FLARE-UPS  Wear gloves while you wash dishes, while cleaning, and when you are outside in the cold.  If you have radiators, place a bowl of water or damp towel on the radiator. This will help put water back in the air. You can also use a humidifier to keep the air moist. Try to keep the humidity at about 60% in your home.  Apply moisturizer while your skin is still damp from bathing or showering. This traps water in the skin.  Avoid long, hot baths or showers. Keep soap use to a minimum. Soaps dry out the skin and wash away the protective oils. Use a fragrance free, dye free soap.  Drink enough water and fluids to keep your urine clear or pale yellow. Not drinking enough water depletes your skin's water  supply.  Turn off the heat at night and keep it low during the day. Cool air is less drying. SEEK MEDICAL CARE IF:  You have increasing pain in the affected areas.  You have uncontrolled bleeding in the affected areas.  You have increasing redness or warmth in the affected areas.  You start to have pain or stiffness in your joints.  You start feeling depressed about your condition.  You have a fever. Document Released: 05/21/2000 Document Revised: 08/16/2011 Document Reviewed: 11/16/2010 Exit[employer] Patient Information 2013 [employer].  

## 2012-10-02 NOTE — Progress Notes (Signed)
  Subjective:    Patient ID: Carolyn Berger, female    DOB: 22-Oct-1945, 67 y.o.   MRN: [id number]  HPI Doing well.Psoriasis txed with soritane, Having prolonged bleeding with minor cuts, having lots of gas with dairy products. Heart mumur is stable with recent echo-reviewed. Lung nodule resolved. Exercising and very fit. Sons are doing well, a Financial planner and an MD.   Review of Systems  Constitutional: Negative.   HENT: Negative.   Eyes: Negative.   Respiratory: Negative.   Cardiovascular: Negative.   Gastrointestinal: Negative.   Endocrine: Negative.   Genitourinary: Negative.   Musculoskeletal: Negative.   Skin: Positive for rash.  Allergic/Immunologic: Positive for food allergies.  Neurological: Negative.   Hematological: Bruises/bleeds easily.  Psychiatric/Behavioral: Negative.        Objective:   Physical Exam  Constitutional: She is oriented to person, place, and time. She appears well-developed and well-nourished.  HENT:  Right Ear: External ear normal.  Left Ear: External ear normal.  Nose: Nose normal.  Mouth/Throat: Oropharynx is clear and moist.  Eyes: Conjunctivae and EOM are normal. Pupils are equal, round, and reactive to light.  Neck: Normal range of motion. Neck supple. No tracheal deviation present. No thyromegaly present.  Cardiovascular: Normal rate, regular rhythm and intact distal pulses.   Murmur heard. Pulmonary/Chest: Effort normal and breath sounds normal.  Abdominal: Soft. Bowel sounds are normal. She exhibits no mass. There is no tenderness.  Musculoskeletal: Normal range of motion.  Lymphadenopathy:    She has no cervical adenopathy.  Neurological: She is alert and oriented to person, place, and time. She has normal reflexes. No cranial nerve deficit. She exhibits normal muscle tone. Coordination normal.  Skin: Rash noted.  Psychiatric: She has a normal mood and affect. Her behavior is normal. Judgment and thought content normal.      Results for orders placed in visit on 10/02/12  POCT URINALYSIS DIPSTICK      Result Value Range   Color, UA yellow     Clarity, UA clear     Glucose, UA neg     Bilirubin, UA neg     Ketones, UA neg     Spec Grav, UA 1.025     Blood, UA small     pH, UA 5.5     Protein, UA neg     Urobilinogen, UA 0.2     Nitrite, UA neg     Leukocytes, UA Negative          Assessment & Plan:  Healthy exam

## 2012-10-02 NOTE — Addendum Note (Signed)
Addended by: Mervin Kung on: 10/02/2012 01:24 PM   Modules accepted: Orders

## 2012-10-03 ENCOUNTER — Encounter: Payer: Self-pay | Admitting: *Deleted

## 2012-10-03 LAB — VITAMIN D 25 HYDROXY (VIT D DEFICIENCY, FRACTURES): Vit D, 25-Hydroxy: 40 ng/mL (ref 30–89)

## 2012-10-10 ENCOUNTER — Ambulatory Visit (INDEPENDENT_AMBULATORY_CARE_PROVIDER_SITE_OTHER): Payer: BC Managed Care – PPO | Admitting: Internal Medicine

## 2012-10-10 VITALS — BP 158/89 | HR 71 | Temp 98.3°F | Resp 16 | Wt 137.2 lb

## 2012-10-10 DIAGNOSIS — D689 Coagulation defect, unspecified: Secondary | ICD-10-CM

## 2012-10-10 DIAGNOSIS — E785 Hyperlipidemia, unspecified: Secondary | ICD-10-CM

## 2012-10-10 DIAGNOSIS — D699 Hemorrhagic condition, unspecified: Secondary | ICD-10-CM

## 2012-10-10 DIAGNOSIS — Z719 Counseling, unspecified: Secondary | ICD-10-CM

## 2012-10-10 MED ORDER — ATORVASTATIN CALCIUM 10 MG PO TABS: 10.0000 mg | ORAL_TABLET | Freq: Every day | ORAL | Status: DC

## 2012-10-10 NOTE — Progress Notes (Signed)
  Subjective:    Patient ID: Carolyn Berger, female    DOB: [birth year], 67 y.o.   MRN: [id number]  HPI Doing well. CPE was normal except elevated cholesterol and LDL, HDL 66. Also bleeding hx after surgery and babies continues to concern her, will ck PT,PTT, Von Wills panel. CBC was nl. Family dies of cancer.   Review of Systems    see cpe Objective:   Physical Exam Normal unchanged       Assessment & Plan:  Start Lipitor 10mg , SED in detail Clotting screen for bleeding

## 2012-10-10 NOTE — Progress Notes (Signed)
  Subjective:    Patient ID: Carolyn Berger, female    DOB: Aug 28, 1945, 68 y.o.   MRN: [id number]  HPI  67 y/o female f/u for lab results.       Review of Systems     Objective:   Physical Exam        Assessment & Plan:    Begin Lipitor for hyperlipidemia  Monitor LFT

## 2012-10-11 ENCOUNTER — Telehealth: Payer: Self-pay

## 2012-10-11 LAB — VON WILLEBRAND PANEL

## 2012-10-11 NOTE — Telephone Encounter (Signed)
Dr. Perrin Maltese, enough frozen sample was not sent to run both the PT & PTT and the Von Willebrand tests. [facility] said that they could do the PT and PTT off of what they have. Do you want [place] to have pt RTC at no charge for a repeat blood draw for the Von Willebrand?

## 2012-10-12 NOTE — Telephone Encounter (Signed)
See labs 

## 2012-10-13 ENCOUNTER — Other Ambulatory Visit: Payer: Self-pay | Admitting: *Deleted

## 2012-10-13 DIAGNOSIS — D689 Coagulation defect, unspecified: Secondary | ICD-10-CM

## 2012-10-13 DIAGNOSIS — D699 Hemorrhagic condition, unspecified: Secondary | ICD-10-CM

## 2012-10-16 ENCOUNTER — Telehealth: Payer: Self-pay | Admitting: Radiology

## 2012-10-16 ENCOUNTER — Other Ambulatory Visit (INDEPENDENT_AMBULATORY_CARE_PROVIDER_SITE_OTHER): Payer: BC Managed Care – PPO | Admitting: *Deleted

## 2012-10-16 DIAGNOSIS — D689 Coagulation defect, unspecified: Secondary | ICD-10-CM

## 2012-10-16 DIAGNOSIS — D649 Anemia, unspecified: Secondary | ICD-10-CM

## 2012-10-16 DIAGNOSIS — D699 Hemorrhagic condition, unspecified: Secondary | ICD-10-CM

## 2012-10-16 LAB — APTT: aPTT: 30 seconds (ref 24–37)

## 2012-10-16 LAB — PROTIME-INR: INR: 0.93 (ref <1.50)

## 2012-10-16 NOTE — Progress Notes (Signed)
Patient here for labs only.  Problem with tubes at the lab, no charge for patient.

## 2012-10-16 NOTE — Telephone Encounter (Signed)
PTT is 30 to you FYI

## 2012-10-18 LAB — VON WILLEBRAND PANEL
Ristocetin Co-factor, Plasma: 64 % (ref 42–200)
Von Willebrand Antigen, Plasma: 111 % (ref 50–217)

## 2012-10-24 ENCOUNTER — Encounter: Payer: Self-pay | Admitting: *Deleted

## 2013-06-04 ENCOUNTER — Ambulatory Visit: Payer: Medicare Other

## 2013-06-04 ENCOUNTER — Ambulatory Visit (INDEPENDENT_AMBULATORY_CARE_PROVIDER_SITE_OTHER): Payer: BC Managed Care – PPO | Admitting: Family Medicine

## 2013-06-04 VITALS — BP 128/68 | HR 81 | Temp 99.0°F | Resp 18 | Ht 65.0 in | Wt 145.0 lb

## 2013-06-04 DIAGNOSIS — J209 Acute bronchitis, unspecified: Secondary | ICD-10-CM

## 2013-06-04 DIAGNOSIS — R05 Cough: Secondary | ICD-10-CM

## 2013-06-04 LAB — POCT CBC
Granulocyte percent: 73.6 %G (ref 37–80)
HCT, POC: 42.3 % (ref 37.7–47.9)
Hemoglobin: 13.6 g/dL (ref 12.2–16.2)
MCV: 92.6 fL (ref 80–97)
RBC: 4.57 M/uL (ref 4.04–5.48)

## 2013-06-04 MED ORDER — HYDROCODONE-HOMATROPINE 5-1.5 MG/5ML PO SYRP: 5.0000 mL | ORAL_SOLUTION | Freq: Three times a day (TID) | ORAL | Status: DC | PRN

## 2013-06-04 MED ORDER — AZITHROMYCIN 250 MG PO TABS: ORAL_TABLET | ORAL | Status: DC

## 2013-06-04 MED ORDER — BENZONATATE 200 MG PO CAPS: 200.0000 mg | ORAL_CAPSULE | Freq: Three times a day (TID) | ORAL | Status: DC | PRN

## 2013-06-04 NOTE — Patient Instructions (Signed)
Acute Bronchitis Bronchitis is inflammation of the airways that extend from the windpipe into the lungs (bronchi). The inflammation often causes mucus to develop. This leads to a cough, which is the most common symptom of bronchitis.  In acute bronchitis, the condition usually develops suddenly and goes away over time, usually in a couple weeks. Smoking, allergies, and asthma can make bronchitis worse. Repeated episodes of bronchitis may cause further lung problems.  CAUSES Acute bronchitis is most often caused by the same virus that causes a cold. The virus can spread from person to person (contagious).  SIGNS AND SYMPTOMS   Cough.   Fever.   Coughing up mucus.   Body aches.   Chest congestion.   Chills.   Shortness of breath.   Sore throat.  DIAGNOSIS  Acute bronchitis is usually diagnosed through a physical exam. Tests, such as chest X-rays, are sometimes done to rule out other conditions.  TREATMENT  Acute bronchitis usually goes away in a couple weeks. Often times, no medical treatment is necessary. Medicines are sometimes given for relief of fever or cough. Antibiotics are usually not needed but may be prescribed in certain situations. In some cases, an inhaler may be recommended to help reduce shortness of breath and control the cough. A cool mist vaporizer may also be used to help thin bronchial secretions and make it easier to clear the chest.  HOME CARE INSTRUCTIONS  Get plenty of rest.   Drink enough fluids to keep your urine clear or pale yellow (unless you have a medical condition that requires fluid restriction). Increasing fluids may help thin your secretions and will prevent dehydration.   Only take over-the-counter or prescription medicines as directed by your health care provider.   Avoid smoking and secondhand smoke. Exposure to cigarette smoke or irritating chemicals will make bronchitis worse. If you are a smoker, consider using nicotine gum or skin  patches to help control withdrawal symptoms. Quitting smoking will help your lungs heal faster.   Reduce the chances of another bout of acute bronchitis by washing your hands frequently, avoiding people with cold symptoms, and trying not to touch your hands to your mouth, nose, or eyes.   Follow up with your health care provider as directed.  SEEK MEDICAL CARE IF: Your symptoms do not improve after 1 week of treatment.  SEEK IMMEDIATE MEDICAL CARE IF:  You develop an increased fever or chills.   You have chest pain.   You have severe shortness of breath.  You have bloody sputum.   You develop dehydration.  You develop fainting.  You develop repeated vomiting.  You develop a severe headache. MAKE SURE YOU:   Understand these instructions.  Will watch your condition.  Will get help right away if you are not doing well or get worse. Document Released: 07/01/2004 Document Revised: 01/24/2013 Document Reviewed: 11/14/2012 Exit[employer] Patient Information 2014 [employer].  

## 2013-06-04 NOTE — Progress Notes (Signed)
Subjective:  This chart was scribed for Norberto Sorenson, MD by Carl Best, Medical Scribe. This patient was seen in Room 11 and the patient's care was started at 1:14 PM.   Patient ID: Carolyn Berger, female    DOB: 1945-09-30, 67 y.o.   MRN: [id number]  Chief Complaint  Patient presents with  . Cough    has started to cough up blood this morning x 1 1/2 weeks     HPI HPI Comments: Carolyn Berger is a 67 y.o. female who presents to the [facility] complaining of a constant cough that started on week ago and a half ago.  The patient states that her symptoms started with fever, chills, and nasal congestion that subsided after 5-7 days.  She states that she had an isolated episode of emesis all night two days ago.  She states that she still feels achy and this morning she coughed up blood mixed in with yellow sputum.  She states that prior to coughing up blood this morning, her sputum was yellow in color.  She states that she has been taking Zyrtec, Advil, and Robitussin for her symptoms with no relief.  She denies sick contacts.     Past Medical History  Diagnosis Date  . Mitral valve prolapse     with moderate mitral regurgitation  . Heart murmur   . Allergy    Past Surgical History  Procedure Laterality Date  . Anterior cruciate ligament repair    . Minicus    . Tee without cardioversion  07/23/2011    Procedure: TRANSESOPHAGEAL ECHOCARDIOGRAM (TEE);  Surgeon: Marca Ancona, MD;  Location: [facility] ENDOSCOPY;  Service: Cardiovascular;  Laterality: N/A;  . Breast biopsy      of benign lesions   Family History  Problem Relation Age of Onset  . Cancer Mother     breast  . Cancer Father     lymphoma  . Cancer Sister     uterine   History   Social History  . Marital Status: Married    Spouse Name: N/A    Number of Children: N/A  . Years of Education: N/A   Occupational History  . Not on file.   Social History Main Topics  . Smoking status: Never Smoker    . Smokeless tobacco: Not on file  . Alcohol Use: Yes     Comment: daily glass of wine  . Drug Use: Not on file  . Sexual Activity: Not on file   Other Topics Concern  . Not on file   Social History Narrative   Exercise karate daily for > 1 hour   Allergies  Allergen Reactions  . Typhoid Vaccines Rash    Review of Systems  Constitutional: Positive for fatigue. Negative for fever, chills, diaphoresis and activity change.  HENT: Negative for congestion, rhinorrhea and sore throat.   Respiratory: Positive for cough. Negative for chest tightness, shortness of breath and wheezing.   Cardiovascular: Negative for chest pain, palpitations and leg swelling.  Gastrointestinal: Negative for nausea and vomiting.  Musculoskeletal: Positive for arthralgias and myalgias. Negative for back pain.  Hematological: Negative for adenopathy.  Psychiatric/Behavioral: Positive for sleep disturbance.   BP 128/68  Pulse 81  Temp(Src) 99 F (37.2 C) (Oral)  Resp 18  Ht 5\' 5"  (1.651 m)  Wt 145 lb (65.772 kg)  BMI 24.13 kg/m2  SpO2 98% Objective:  Physical Exam  Nursing note and vitals reviewed. Constitutional: She is oriented to person,  place, and time. She appears well-developed and well-nourished. No distress.  HENT:  Right Ear: Hearing, tympanic membrane, external ear and ear canal normal.  Left Ear: Hearing, tympanic membrane, external ear and ear canal normal.  Nose: Mucosal edema and rhinorrhea present.  Mouth/Throat: Uvula is midline, oropharynx is clear and moist and mucous membranes are normal.  Eyes: Conjunctivae and EOM are normal. Pupils are equal, round, and reactive to light.  Neck: Normal range of motion. Neck supple.  Cardiovascular: Normal rate and regular rhythm.  Exam reveals no gallop and no friction rub.   Murmur heard.  Systolic murmur is present with a grade of 2/6  Pulmonary/Chest: Effort normal. No respiratory distress. She has no wheezes. She has no rhonchi. She has  rales (inspiratory) in the left lower field.  Lymphadenopathy:    She has cervical adenopathy.       Right cervical: Superficial cervical (anterior) adenopathy present.       Left cervical: Superficial cervical (anterior) adenopathy present.  Neurological: She is alert and oriented to person, place, and time.  Skin: Skin is warm and dry.  Psychiatric: Her behavior is normal.   [facility] preliminary x-ray report read by Dr. Clelia Croft: Chest x-ray - No discrete infiltrate or acute abnormality.  Cardiomegaly.    Assessment & Plan:  Cough - Plan: POCT CBC, DG Chest 2 View  Acute bronchitis  Meds ordered this encounter  Medications  . methotrexate (RHEUMATREX) 5 MG tablet    Sig: Take 5 mg by mouth once a week. Caution: Chemotherapy. Protect from light.  Marland Kitchen azithromycin (ZITHROMAX) 250 MG tablet    Sig: Take 2 tabs PO x 1 dose, then 1 tab PO QD x 4 days    Dispense:  6 tablet    Refill:  0  . HYDROcodone-homatropine (HYCODAN) 5-1.5 MG/5ML syrup    Sig: Take 5 mLs by mouth every 8 (eight) hours as needed for cough.    Dispense:  120 mL    Refill:  0  . benzonatate (TESSALON) 200 MG capsule    Sig: Take 1 capsule (200 mg total) by mouth 3 (three) times daily as needed for cough.    Dispense:  30 capsule    Refill:  0    I personally performed the services described in this documentation, which was scribed in my presence. The recorded information has been reviewed and considered, and addended by me as needed.  Norberto Sorenson, MD MPH

## 2013-06-11 ENCOUNTER — Encounter (INDEPENDENT_AMBULATORY_CARE_PROVIDER_SITE_OTHER): Payer: Self-pay

## 2013-06-11 ENCOUNTER — Ambulatory Visit (INDEPENDENT_AMBULATORY_CARE_PROVIDER_SITE_OTHER): Payer: Medicare Other | Admitting: Emergency Medicine

## 2013-06-11 ENCOUNTER — Ambulatory Visit: Payer: Medicare Other

## 2013-06-11 VITALS — BP 122/72 | HR 85 | Temp 98.0°F | Resp 16 | Ht 65.0 in | Wt 145.2 lb

## 2013-06-11 DIAGNOSIS — L408 Other psoriasis: Secondary | ICD-10-CM | POA: Diagnosis not present

## 2013-06-11 DIAGNOSIS — J189 Pneumonia, unspecified organism: Secondary | ICD-10-CM

## 2013-06-11 NOTE — Progress Notes (Signed)
[facility] [address] [place] [address] [phone]  Date:  06/11/2013   Name:  Carolyn Berger   DOB:  1945/06/25   MRN:  [id number]  PCP:  Kennon Portela, MD    Chief Complaint: Follow-up   History of Present Illness:  Carolyn Berger is a 68 y.o. very pleasant female patient who presents with the following:  Treated for pneumonia with zithromax a week ago by Dr Brigitte Pulse.  She says she is feeling "better" but is still weak.  No further fever, wheezing or shortness of breath.  Sputum has cleared from purulent to clear.  Concerned that her pneumonia was 'missed' by the provider.  Denies other complaint or health concern today.   Patient Active Problem List   Diagnosis Date Noted  . Psoriasis 10/02/2012  . Dyspnea 07/01/2011  . HYPERLIPIDEMIA 12/23/2008  . MITRAL REGURGITATION 12/20/2008  . MITRAL VALVE PROLAPSE 12/20/2008    Past Medical History  Diagnosis Date  . Mitral valve prolapse     with moderate mitral regurgitation  . Heart murmur   . Allergy     Past Surgical History  Procedure Laterality Date  . Anterior cruciate ligament repair    . Minicus    . Tee without cardioversion  07/23/2011    Procedure: TRANSESOPHAGEAL ECHOCARDIOGRAM (TEE);  Surgeon: Loralie Champagne, MD;  Location: [facility] ENDOSCOPY;  Service: Cardiovascular;  Laterality: N/A;  . Breast biopsy      of benign lesions    History  Substance Use Topics  . Smoking status: Never Smoker   . Smokeless tobacco: Not on file  . Alcohol Use: Yes     Comment: daily glass of wine    Family History  Problem Relation Age of Onset  . Cancer Mother     breast  . Cancer Father     lymphoma  . Cancer Sister     uterine    Allergies  Allergen Reactions  . Typhoid Vaccines Rash    Medication list has been reviewed and updated.  Current Outpatient Prescriptions on File Prior to Visit  Medication Sig Dispense Refill  . acitretin (SORIATANE) 25 MG capsule Take 25 mg by  mouth daily before breakfast.      . atorvastatin (LIPITOR) 10 MG tablet Take 1 tablet (10 mg total) by mouth daily.  90 tablet  3  . benzonatate (TESSALON) 200 MG capsule Take 1 capsule (200 mg total) by mouth 3 (three) times daily as needed for cough.  30 capsule  0  . cetirizine (ZYRTEC) 5 MG tablet Take 5 mg by mouth daily.      . Clobetasol Propionate (CLOBEX) 0.05 % shampoo Apply topically 1 day or 1 dose.      Marland Kitchen glucosamine-chondroitin 500-400 MG tablet Take 1 tablet by mouth once.      Marland Kitchen HYDROcodone-homatropine (HYCODAN) 5-1.5 MG/5ML syrup Take 5 mLs by mouth every 8 (eight) hours as needed for cough.  120 mL  0  . methotrexate (RHEUMATREX) 5 MG tablet Take 5 mg by mouth once a week. Caution: Chemotherapy. Protect from light.      . raloxifene (EVISTA) 60 MG tablet Take 60 mg by mouth daily.      Marland Kitchen azithromycin (ZITHROMAX) 250 MG tablet Take 2 tabs PO x 1 dose, then 1 tab PO QD x 4 days  6 tablet  0   No current facility-administered medications on file prior to visit.    Review of Systems:  As per HPI, otherwise negative.    Physical Examination: Filed Vitals:   06/11/13 1059  BP: 122/72  Pulse: 85  Temp: 98 F (36.7 C)  Resp: 16   Filed Vitals:   06/11/13 1059  Height: 5\' 5"  (1.651 m)  Weight: 145 lb 3.2 oz (65.862 kg)   Body mass index is 24.16 kg/(m^2). Ideal Body Weight: Weight in (lb) to have BMI = 25: 149.9  GEN: WDWN, NAD, Non-toxic, A & O x 3 HEENT: Atraumatic, Normocephalic. Neck supple. No masses, No LAD. Ears and Nose: No external deformity. CV: RRR, No M/G/R. No JVD. No thrill. No extra heart sounds. PULM: CTA B, no wheezes, crackles, rhonchi. No retractions. No resp. distress. No accessory muscle use. ABD: S, NT, ND, +BS. No rebound. No HSM. EXTR: No c/c/e NEURO Normal gait.  PSYCH: Normally interactive. Conversant. Not depressed or anxious appearing.  Calm demeanor.    Assessment and Plan: Reassured that she was treated appropriately for  pneumonia by Dr Brigitte Pulse and the system worked in that the radiologist reported the abnormality. Clinically improving.  Radiographic clearing Reassured   Signed,  Ellison Carwin, MD   [facility] reading (PRIMARY) by  Dr. Ouida Sills resolving pneumonia.

## 2013-06-11 NOTE — Patient Instructions (Signed)

## 2013-07-09 DIAGNOSIS — L408 Other psoriasis: Secondary | ICD-10-CM | POA: Diagnosis not present

## 2013-09-26 DIAGNOSIS — L408 Other psoriasis: Secondary | ICD-10-CM | POA: Diagnosis not present

## 2013-09-26 DIAGNOSIS — Z79899 Other long term (current) drug therapy: Secondary | ICD-10-CM | POA: Diagnosis not present

## 2013-10-01 ENCOUNTER — Ambulatory Visit: Payer: BC Managed Care – PPO | Admitting: Cardiology

## 2013-10-03 DIAGNOSIS — H60399 Other infective otitis externa, unspecified ear: Secondary | ICD-10-CM | POA: Diagnosis not present

## 2013-10-08 DIAGNOSIS — L408 Other psoriasis: Secondary | ICD-10-CM | POA: Diagnosis not present

## 2013-10-22 ENCOUNTER — Encounter: Payer: Self-pay | Admitting: Cardiology

## 2013-10-22 ENCOUNTER — Ambulatory Visit (INDEPENDENT_AMBULATORY_CARE_PROVIDER_SITE_OTHER): Payer: Medicare Other | Admitting: Cardiology

## 2013-10-22 ENCOUNTER — Ambulatory Visit (INDEPENDENT_AMBULATORY_CARE_PROVIDER_SITE_OTHER)
Admission: RE | Admit: 2013-10-22 | Discharge: 2013-10-22 | Disposition: A | Discharge status: Home / Self Care | Payer: Self-pay | Source: Ambulatory Visit | Attending: Cardiology | Admitting: Cardiology

## 2013-10-22 VITALS — BP 122/70 | HR 65 | Ht 65.0 in | Wt 147.0 lb

## 2013-10-22 DIAGNOSIS — R0989 Other specified symptoms and signs involving the circulatory and respiratory systems: Secondary | ICD-10-CM

## 2013-10-22 DIAGNOSIS — I059 Rheumatic mitral valve disease, unspecified: Secondary | ICD-10-CM | POA: Diagnosis not present

## 2013-10-22 DIAGNOSIS — R0609 Other forms of dyspnea: Secondary | ICD-10-CM

## 2013-10-22 DIAGNOSIS — E785 Hyperlipidemia, unspecified: Secondary | ICD-10-CM

## 2013-10-22 NOTE — Patient Instructions (Signed)
The current medical regimen is effective;  continue present plan and medications.  Your physician has requested that you have an echocardiogram. Echocardiography is a painless test that uses sound waves to create images of your heart. It provides your doctor with information about the size and shape of your heart and how well your heart's chambers and valves are working. This procedure takes approximately one hour. There are no restrictions for this procedure.  Your physician has requested that you have calcium score. Cardiac computed tomography (CT) is a painless test that uses an x-ray machine to take clear, detailed pictures of your heart. For further information please visit [email or web address]. Please follow instruction sheet as given.  Follow up in 1 year with Dr Percival Spanish at the [facility] office.  You will receive a letter in the mail 2 months before you are due.  Please call us when you receive this letter to schedule your follow up appointment.

## 2013-10-22 NOTE — Progress Notes (Signed)
HPI The patient presents for followup of mitral valve prolapse.  Since I last saw her she has had no new symptoms. He still doing her karate. The patient denies any new symptoms such as chest discomfort, neck or arm discomfort. There has been no new shortness of breath, PND or orthopnea. There have been no reported palpitations, presyncope or syncope.  However, she thinks that she has reduced muscle strength and she blames this on Lipitor.  She says that she used to do 100 push ups but can only do 20 now.    Allergies  Allergen Reactions  . Typhoid Vaccines Rash    Current Outpatient Prescriptions  Medication Sig Dispense Refill  . acitretin (SORIATANE) 25 MG capsule Take 25 mg by mouth daily before breakfast.      . atorvastatin (LIPITOR) 10 MG tablet Take 1 tablet (10 mg total) by mouth daily.  90 tablet  3  . azithromycin (ZITHROMAX) 250 MG tablet Take 2 tabs PO x 1 dose, then 1 tab PO QD x 4 days  6 tablet  0  . benzonatate (TESSALON) 200 MG capsule Take 1 capsule (200 mg total) by mouth 3 (three) times daily as needed for cough.  30 capsule  0  . cetirizine (ZYRTEC) 5 MG tablet Take 5 mg by mouth daily.      . Clobetasol Propionate (CLOBEX) 0.05 % shampoo Apply topically 1 day or 1 dose.      Marland Kitchen glucosamine-chondroitin 500-400 MG tablet Take 1 tablet by mouth once.      Marland Kitchen HYDROcodone-homatropine (HYCODAN) 5-1.5 MG/5ML syrup Take 5 mLs by mouth every 8 (eight) hours as needed for cough.  120 mL  0  . methotrexate (RHEUMATREX) 5 MG tablet Take 5 mg by mouth once a week. Caution: Chemotherapy. Protect from light.      . raloxifene (EVISTA) 60 MG tablet Take 60 mg by mouth daily.       No current facility-administered medications for this visit.    Past Medical History  Diagnosis Date  . Mitral valve prolapse     with moderate mitral regurgitation  . Heart murmur   . Allergy     Past Surgical History  Procedure Laterality Date  . Anterior cruciate ligament repair    . Minicus     . Tee without cardioversion  07/23/2011    Procedure: TRANSESOPHAGEAL ECHOCARDIOGRAM (TEE);  Surgeon: Loralie Champagne, MD;  Location: [facility] ENDOSCOPY;  Service: Cardiovascular;  Laterality: N/A;  . Breast biopsy      of benign lesions    ROS:  As stated in the HPI and negative for all other systems.  PHYSICAL EXAM There were no vitals taken for this visit. GENERAL:  Well appearing NECK:  No jugular venous distention, waveform within normal limits, carotid upstroke brisk and symmetric, no bruits, no thyromegaly LUNGS:  Clear to auscultation bilaterally CHEST:  Unremarkable HEART:  PMI not displaced or sustained,S1 and S2 within normal limits, no S3, no S4, positive mid systolic click with mild brief systolic murmur, no rubs. ABD:  Flat, positive bowel sounds normal in frequency in pitch, no bruits, no rebound, no guarding, no midline pulsatile mass, no hepatomegaly, no splenomegaly EXT:  2 plus pulses throughout, no edema, no cyanosis no clubbing  EKG:  Sinus rhythm, rate 65, axis within normal limits, questionable right atrial enlargement, poor anterior R wave progression, no acute ST-T wave changes. 10/22/2013  ASSESSMENT AND PLAN  MITRAL REGURGITATION:  The patient has had stable mitral  regurgitation for years. I will repeat a TTE this year.  DYSLIPIDEMIA:  She feels that the Lipitor has reduced her muscle strength.  She is only taking it every other day.  I will order a coronary calcium score.  This will guide the statin therapy.  I would stop the statin all together if her coronary calcium score is low as it is for primary prevention.  In addition she does have high HDL.  Her LDL was greater than 150.

## 2013-10-23 NOTE — Progress Notes (Signed)
Quick Note:  Patient notified of CT Cardiac Scoring results, as per Dr. Percival Spanish, including that she can stop her Lipitor. Patient verbalized understanding and agreement with current treatment plan. No questions or concerns voiced. Results routed to Dr. Elder Cyphers. ______

## 2013-11-06 ENCOUNTER — Ambulatory Visit (HOSPITAL_COMMUNITY): Payer: Medicare Other | Attending: Cardiology | Admitting: Cardiology

## 2013-11-06 DIAGNOSIS — R0600 Dyspnea, unspecified: Secondary | ICD-10-CM

## 2013-11-06 DIAGNOSIS — I059 Rheumatic mitral valve disease, unspecified: Secondary | ICD-10-CM | POA: Insufficient documentation

## 2013-11-06 DIAGNOSIS — E785 Hyperlipidemia, unspecified: Secondary | ICD-10-CM | POA: Insufficient documentation

## 2013-11-06 NOTE — Progress Notes (Signed)
Echo performed. 

## 2014-01-09 DIAGNOSIS — B081 Molluscum contagiosum: Secondary | ICD-10-CM | POA: Diagnosis not present

## 2014-01-09 DIAGNOSIS — L819 Disorder of pigmentation, unspecified: Secondary | ICD-10-CM | POA: Diagnosis not present

## 2014-01-09 DIAGNOSIS — D235 Other benign neoplasm of skin of trunk: Secondary | ICD-10-CM | POA: Diagnosis not present

## 2014-01-09 DIAGNOSIS — L408 Other psoriasis: Secondary | ICD-10-CM | POA: Diagnosis not present

## 2014-01-09 DIAGNOSIS — L821 Other seborrheic keratosis: Secondary | ICD-10-CM | POA: Diagnosis not present

## 2014-01-10 ENCOUNTER — Other Ambulatory Visit: Payer: Self-pay | Admitting: Internal Medicine

## 2014-03-22 ENCOUNTER — Other Ambulatory Visit: Payer: Self-pay

## 2014-04-02 ENCOUNTER — Ambulatory Visit (INDEPENDENT_AMBULATORY_CARE_PROVIDER_SITE_OTHER): Payer: Medicare Other | Admitting: Internal Medicine

## 2014-04-02 ENCOUNTER — Other Ambulatory Visit: Payer: Self-pay | Admitting: Internal Medicine

## 2014-04-02 ENCOUNTER — Ambulatory Visit (INDEPENDENT_AMBULATORY_CARE_PROVIDER_SITE_OTHER): Payer: Medicare Other

## 2014-04-02 VITALS — BP 142/90 | HR 70 | Temp 98.4°F | Resp 16 | Ht 65.0 in | Wt 144.6 lb

## 2014-04-02 DIAGNOSIS — Z85038 Personal history of other malignant neoplasm of large intestine: Secondary | ICD-10-CM

## 2014-04-02 DIAGNOSIS — R1030 Lower abdominal pain, unspecified: Secondary | ICD-10-CM | POA: Diagnosis not present

## 2014-04-02 DIAGNOSIS — R635 Abnormal weight gain: Secondary | ICD-10-CM

## 2014-04-02 DIAGNOSIS — R140 Abdominal distension (gaseous): Secondary | ICD-10-CM | POA: Diagnosis not present

## 2014-04-02 DIAGNOSIS — R197 Diarrhea, unspecified: Secondary | ICD-10-CM

## 2014-04-02 DIAGNOSIS — Z853 Personal history of malignant neoplasm of breast: Secondary | ICD-10-CM

## 2014-04-02 DIAGNOSIS — K5900 Constipation, unspecified: Secondary | ICD-10-CM | POA: Diagnosis not present

## 2014-04-02 LAB — COMPREHENSIVE METABOLIC PANEL
ALBUMIN: 4.7 g/dL (ref 3.5–5.2)
ALT: 14 U/L (ref 0–35)
AST: 17 U/L (ref 0–37)
Alkaline Phosphatase: 79 U/L (ref 39–117)
BILIRUBIN TOTAL: 0.7 mg/dL (ref 0.2–1.2)
BUN: 15 mg/dL (ref 6–23)
CO2: 27 meq/L (ref 19–32)
Calcium: 10.2 mg/dL (ref 8.4–10.5)
Chloride: 103 mEq/L (ref 96–112)
Creat: 0.81 mg/dL (ref 0.50–1.10)
GLUCOSE: 98 mg/dL (ref 70–99)
Potassium: 5 mEq/L (ref 3.5–5.3)
SODIUM: 140 meq/L (ref 135–145)
Total Protein: 7.6 g/dL (ref 6.0–8.3)

## 2014-04-02 LAB — POCT URINALYSIS DIPSTICK
Bilirubin, UA: NEGATIVE
Glucose, UA: NEGATIVE
Ketones, UA: NEGATIVE
Nitrite, UA: NEGATIVE
PROTEIN UA: NEGATIVE
SPEC GRAV UA: 1.02
Urobilinogen, UA: 0.2
pH, UA: 5

## 2014-04-02 LAB — POCT CBC
Granulocyte percent: 59.6 %G (ref 37–80)
HEMATOCRIT: 45 % (ref 37.7–47.9)
Hemoglobin: 14.3 g/dL (ref 12.2–16.2)
LYMPH, POC: 2.6 (ref 0.6–3.4)
MCH: 29.2 pg (ref 27–31.2)
MCHC: 31.9 g/dL (ref 31.8–35.4)
MCV: 91.7 fL (ref 80–97)
MID (cbc): 0.7 (ref 0–0.9)
MPV: 9.4 fL (ref 0–99.8)
POC Granulocyte: 4.9 (ref 2–6.9)
POC LYMPH PERCENT: 32.3 %L (ref 10–50)
POC MID %: 8.1 %M (ref 0–12)
Platelet Count, POC: 214 10*3/uL (ref 142–424)
RBC: 4.91 M/uL (ref 4.04–5.48)
RDW, POC: 13.7 %
WBC: 8.2 10*3/uL (ref 4.6–10.2)

## 2014-04-02 LAB — POCT UA - MICROSCOPIC ONLY
BACTERIA, U MICROSCOPIC: NEGATIVE
CRYSTALS, UR, HPF, POC: NEGATIVE
Casts, Ur, LPF, POC: NEGATIVE
MUCUS UA: NEGATIVE
Yeast, UA: NEGATIVE

## 2014-04-02 LAB — POCT SEDIMENTATION RATE: POCT SED RATE: 22 mm/hr (ref 0–22)

## 2014-04-02 LAB — LIPASE: LIPASE: 26 U/L (ref 0–75)

## 2014-04-02 LAB — TSH: TSH: 2.248 u[IU]/mL (ref 0.350–4.500)

## 2014-04-02 NOTE — Patient Instructions (Addendum)
Diarrhea Diarrhea is frequent loose and watery bowel movements. It can cause you to feel weak and dehydrated. Dehydration can cause you to become tired and thirsty, have a dry mouth, and have decreased urination that often is dark yellow. Diarrhea is a sign of another problem, most often an infection that will not last long. In most cases, diarrhea typically lasts 2-3 days. However, it can last longer if it is a sign of something more serious. It is important to treat your diarrhea as directed by your caregiver to lessen or prevent future episodes of diarrhea. CAUSES  Some common causes include:  Gastrointestinal infections caused by viruses, bacteria, or parasites.  Food poisoning or food allergies.  Certain medicines, such as antibiotics, chemotherapy, and laxatives.  Artificial sweeteners and fructose.  Digestive disorders. HOME CARE INSTRUCTIONS  Ensure adequate fluid intake (hydration): Have 1 cup (8 oz) of fluid for each diarrhea episode. Avoid fluids that contain simple sugars or sports drinks, fruit juices, whole milk products, and sodas. Your urine should be clear or pale yellow if you are drinking enough fluids. Hydrate with an oral rehydration solution that you can purchase at pharmacies, retail stores, and online. You can prepare an oral rehydration solution at home by mixing the following ingredients together:   - tsp table salt.   tsp baking soda.   tsp salt substitute containing potassium chloride.  1  tablespoons sugar.  1 L (34 oz) of water.  Certain foods and beverages may increase the speed at which food moves through the gastrointestinal (GI) tract. These foods and beverages should be avoided and include:  Caffeinated and alcoholic beverages.  High-fiber foods, such as raw fruits and vegetables, nuts, seeds, and whole grain breads and cereals.  Foods and beverages sweetened with sugar alcohols, such as xylitol, sorbitol, and mannitol.  Some foods may be  well tolerated and may help thicken stool including:  Starchy foods, such as rice, toast, pasta, low-sugar cereal, oatmeal, grits, baked potatoes, crackers, and bagels.  Bananas.  Applesauce.  Add probiotic-rich foods to help increase healthy bacteria in the GI tract, such as yogurt and fermented milk products.  Wash your hands well after each diarrhea episode.  Only take over-the-counter or prescription medicines as directed by your caregiver.  Take a warm bath to relieve any burning or pain from frequent diarrhea episodes. SEEK IMMEDIATE MEDICAL CARE IF:   You are unable to keep fluids down.  You have persistent vomiting.  You have blood in your stool, or your stools are black and tarry.  You do not urinate in 6-8 hours, or there is only a small amount of very dark urine.  You have abdominal pain that increases or localizes.  You have weakness, dizziness, confusion, or light-headedness.  You have a severe headache.  Your diarrhea gets worse or does not get better.  You have a fever or persistent symptoms for more than 2-3 days.  You have a fever and your symptoms suddenly get worse. MAKE SURE YOU:   Understand these instructions.  Will watch your condition.  Will get help right away if you are not doing well or get worse. Document Released: 05/14/2002 Document Revised: 10/08/2013 Document Reviewed: 01/30/2012 [facility] Patient Information 2015 [place], [place]. This information is not intended to replace advice given to you by your health care provider. Make sure you discuss any questions you have with your health care provider. Constipation Constipation is when a person has fewer than three bowel movements a week, has  difficulty having a bowel movement, or has stools that are dry, hard, or larger than normal. As people grow older, constipation is more common. If you try to fix constipation with medicines that make you have a bowel movement (laxatives), the problem may  get worse. Long-term laxative use may cause the muscles of the colon to become weak. A low-fiber diet, not taking in enough fluids, and taking certain medicines may make constipation worse.  CAUSES   Certain medicines, such as antidepressants, pain medicine, iron supplements, antacids, and water pills.   Certain diseases, such as diabetes, irritable bowel syndrome (IBS), thyroid disease, or depression.   Not drinking enough water.   Not eating enough fiber-rich foods.   Stress or travel.   Lack of physical activity or exercise.   Ignoring the urge to have a bowel movement.   Using laxatives too much.  SIGNS AND SYMPTOMS   Having fewer than three bowel movements a week.   Straining to have a bowel movement.   Having stools that are hard, dry, or larger than normal.   Feeling full or bloated.   Pain in the lower abdomen.   Not feeling relief after having a bowel movement.  DIAGNOSIS  Your health care provider will take a medical history and perform a physical exam. Further testing may be done for severe constipation. Some tests may include:  A barium enema X-ray to examine your rectum, colon, and, sometimes, your small intestine.   A sigmoidoscopy to examine your lower colon.   A colonoscopy to examine your entire colon. TREATMENT  Treatment will depend on the severity of your constipation and what is causing it. Some dietary treatments include drinking more fluids and eating more fiber-rich foods. Lifestyle treatments may include regular exercise. If these diet and lifestyle recommendations do not help, your health care provider may recommend taking over-the-counter laxative medicines to help you have bowel movements. Prescription medicines may be prescribed if over-the-counter medicines do not work.  HOME CARE INSTRUCTIONS   Eat foods that have a lot of fiber, such as fruits, vegetables, whole grains, and beans.  Limit foods high in fat and processed  sugars, such as french fries, hamburgers, cookies, candies, and soda.   A fiber supplement may be added to your diet if you cannot get enough fiber from foods.   Drink enough fluids to keep your urine clear or pale yellow.   Exercise regularly or as directed by your health care provider.   Go to the restroom when you have the urge to go. Do not hold it.   Only take over-the-counter or prescription medicines as directed by your health care provider. Do not take other medicines for constipation without talking to your health care provider first.  [place] IF:   You have bright red blood in your stool.   Your constipation lasts for more than 4 days or gets worse.   You have abdominal or rectal pain.   You have thin, pencil-like stools.   You have unexplained weight loss. MAKE SURE YOU:   Understand these instructions.  Will watch your condition.  Will get help right away if you are not doing well or get worse. Document Released: 02/20/2004 Document Revised: 05/29/2013 Document Reviewed: 03/05/2013 [facility] Patient Information 2015 [place], [place]. This information is not intended to replace advice given to you by your health care provider. Make sure you discuss any questions you have with your health care provider. Bloating Bloating is the feeling of fullness  in your belly. You may feel as though your pants are too tight. Often the cause of bloating is overeating, retaining fluids, or having gas in your bowel. It is also caused by swallowing air and eating foods that cause gas. Irritable bowel syndrome is one of the most common causes of bloating. Constipation is also a common cause. Sometimes more serious problems can cause bloating. SYMPTOMS  Usually there is a feeling of fullness, as though your abdomen is bulged out. There may be mild discomfort.  DIAGNOSIS  Usually no particular testing is necessary for most bloating. If the condition persists and  seems to become worse, your caregiver may do additional testing.  TREATMENT   There is no direct treatment for bloating.  Do not put gas into the bowel. Avoid chewing gum and sucking on candy. These tend to make you swallow air. Swallowing air can also be a nervous habit. Try to avoid this.  Avoiding high residue diets will help. Eat foods with soluble fibers (examples include root vegetables, apples, or barley) and substitute dairy products with soy and rice products. This helps irritable bowel syndrome.  If constipation is the cause, then a high residue diet with more fiber will help.  Avoid carbonated beverages.  Over-the-counter preparations are available that help reduce gas. Your pharmacist can help you with this. SEEK MEDICAL CARE IF:   Bloating continues and seems to be getting worse.  You notice a weight gain.  You have a weight loss but the bloating is getting worse.  You have changes in your bowel habits or develop nausea or vomiting. SEEK IMMEDIATE MEDICAL CARE IF:   You develop shortness of breath or swelling in your legs.  You have an increase in abdominal pain or develop chest pain. Document Released: 03/24/2006 Document Revised: 08/16/2011 Document Reviewed: 05/12/2007 [facility] Patient Information 2015 [place], [place]. This information is not intended to replace advice given to you by your health care provider. Make sure you discuss any questions you have with your health care provider.

## 2014-04-02 NOTE — Progress Notes (Signed)
Subjective:    Patient ID: Carolyn Berger, female    DOB: May 18, 1946, 68 y.o.   MRN: [id number]  HPI A 68 year old female is here with abdominal pain, diarrhea and constipation that has been concurrent since late August of this year.  Patient states she is having lower abdominal pain which comes and goes and it is not stabilized in one particular area.  She states she has bloating and flatulence. She also states that she is not experiencing any nausea or vomiting with the abdominal pain.  She states that she is having extreme cases of diarrhea.  She describes her diarrhea as being very watery with no signs of blood or mucous present.  She mentions that she has not had an bowel movement with solid stools since August.  The patient states that she is constipated but not that bad.  She states there are times when she urinates she often passes a little stool as well.    She mentions that on this past Saturday, she took an laxative hoping it would provide constipation relief.  However, she points out that all that was resulted was lots of brown water. Patient states she is eating and drinking normally with no significant change in her diet.  She denies having any weight loss but did mention having weight gain before feeling ill.  She states that she is still getting her exercise in as normal.  The patient is not taking any OTC medication at this time for relief of above symptoms.  The patient does mention that she had traveled to [place] in June before the symptoms started and she was concern that she picked up anything there.  However, she did add that around September this year she visited [place] with family and her diarrhea reached his worse stage.  She states that while in [place] she brought some Imodium and it provided immediately relief. She states she took an Probiotic for about 3 weeks but is not on it as of now.  Patient states to her knowledge she has not been exposed to anyone else with the same  issues or illness.  The patient states she had an colonoscopy done in 2008 and has the report with her today for an reference if needed.  She states that her mother had obstructive issues in her digestive area.  She states her family has wide ranges of cancer history.Mother had metastatic cancer to bowel, sister had breast cancer another had uterine cancer and another had lymphoma and leukemia.  At this time patient denies having or have had any fever, cold, chills, nausea, vomiting or any other illnesses present.    Review of Systems     Objective:   Physical Exam  Vitals reviewed. Constitutional: She is oriented to person, place, and time. She appears well-developed and well-nourished. No distress.  HENT:  Head: Normocephalic.  Mouth/Throat: Oropharynx is clear and moist.  Eyes: Conjunctivae and EOM are normal. Pupils are equal, round, and reactive to light. No scleral icterus.  Neck: Normal range of motion. Neck supple. No tracheal deviation present. No thyromegaly present.  Cardiovascular: Normal rate, regular rhythm, normal heart sounds and intact distal pulses.   Pulmonary/Chest: Effort normal and breath sounds normal. She exhibits no tenderness. Right breast exhibits mass, skin change and tenderness. Right breast exhibits no inverted nipple and no nipple discharge. Left breast exhibits no inverted nipple, no mass, no nipple discharge, no skin change and no tenderness. Breasts are asymmetrical.    Dimpling above  mass  Abdominal: Soft. Bowel sounds are normal. She exhibits no distension and no mass. There is no tenderness. There is no rebound and no guarding.  Musculoskeletal: Normal range of motion.  Lymphadenopathy:    She has no cervical adenopathy.  Neurological: She is alert and oriented to person, place, and time. She exhibits normal muscle tone. Coordination normal.  Psychiatric: She has a normal mood and affect. Her behavior is normal. Judgment and thought content normal.     Weight gain apparent but healthy appearing  [facility] reading (PRIMARY) by  Dr.Dezirea Mccollister no free air, small ileus with small bowel gas pattern, mostly fos  Results for orders placed in visit on 04/02/14  POCT CBC      Result Value Ref Range   WBC 8.2  4.6 - 10.2 K/uL   Lymph, poc 2.6  0.6 - 3.4   POC LYMPH PERCENT 32.3  10 - 50 %L   MID (cbc) 0.7  0 - 0.9   POC MID % 8.1  0 - 12 %M   POC Granulocyte 4.9  2 - 6.9   Granulocyte percent 59.6  37 - 80 %G   RBC 4.91  4.04 - 5.48 M/uL   Hemoglobin 14.3  12.2 - 16.2 g/dL   HCT, POC 45.0  37.7 - 47.9 %   MCV 91.7  80 - 97 fL   MCH, POC 29.2  27 - 31.2 pg   MCHC 31.9  31.8 - 35.4 g/dL   RDW, POC 13.7     Platelet Count, POC 214  142 - 424 K/uL   MPV 9.4  0 - 99.8 fL  POCT UA - MICROSCOPIC ONLY      Result Value Ref Range   WBC, Ur, HPF, POC 0-1     RBC, urine, microscopic 2-3     Bacteria, U Microscopic neg     Mucus, UA neg     Epithelial cells, urine per micros 1-3     Crystals, Ur, HPF, POC neg     Casts, Ur, LPF, POC neg     Yeast, UA neg    POCT URINALYSIS DIPSTICK      Result Value Ref Range   Color, UA yellow     Clarity, UA clear     Glucose, UA neg     Bilirubin, UA neg     Ketones, UA neg     Spec Grav, UA 1.020     Blood, UA trace-intact     pH, UA 5.0     Protein, UA neg     Urobilinogen, UA 0.2     Nitrite, UA neg     Leukocytes, UA Trace             Assessment & Plan:  Abdominal pain/Bloating/Change in bowel habits Right breast lump/To go to Dr. Isaiah Blakes this week agrees Refer to GI after CT abdomen/pelvis this week RTC next week

## 2014-04-03 DIAGNOSIS — R921 Mammographic calcification found on diagnostic imaging of breast: Secondary | ICD-10-CM | POA: Diagnosis not present

## 2014-04-03 DIAGNOSIS — Z853 Personal history of malignant neoplasm of breast: Secondary | ICD-10-CM | POA: Diagnosis not present

## 2014-04-03 DIAGNOSIS — N6459 Other signs and symptoms in breast: Secondary | ICD-10-CM | POA: Diagnosis not present

## 2014-04-05 ENCOUNTER — Ambulatory Visit
Admission: RE | Admit: 2014-04-05 | Discharge: 2014-04-05 | Disposition: A | Discharge status: Home / Self Care | Payer: Medicare Other | Source: Ambulatory Visit | Attending: Internal Medicine | Admitting: Internal Medicine

## 2014-04-05 DIAGNOSIS — K8020 Calculus of gallbladder without cholecystitis without obstruction: Secondary | ICD-10-CM | POA: Diagnosis not present

## 2014-04-05 DIAGNOSIS — R140 Abdominal distension (gaseous): Secondary | ICD-10-CM | POA: Diagnosis not present

## 2014-04-05 DIAGNOSIS — K5900 Constipation, unspecified: Secondary | ICD-10-CM

## 2014-04-05 DIAGNOSIS — R1030 Lower abdominal pain, unspecified: Secondary | ICD-10-CM

## 2014-04-05 DIAGNOSIS — R197 Diarrhea, unspecified: Secondary | ICD-10-CM

## 2014-04-05 MED ORDER — IOHEXOL 300 MG/ML  SOLN
100.0000 mL | Freq: Once | INTRAMUSCULAR | Status: AC | PRN
  Administered 2014-04-05: 100 mL via INTRAVENOUS

## 2014-04-22 DIAGNOSIS — R140 Abdominal distension (gaseous): Secondary | ICD-10-CM | POA: Diagnosis not present

## 2014-04-22 DIAGNOSIS — R194 Change in bowel habit: Secondary | ICD-10-CM | POA: Diagnosis not present

## 2014-04-22 DIAGNOSIS — R933 Abnormal findings on diagnostic imaging of other parts of digestive tract: Secondary | ICD-10-CM | POA: Diagnosis not present

## 2014-04-24 ENCOUNTER — Encounter: Payer: Self-pay | Admitting: Internal Medicine

## 2014-04-25 ENCOUNTER — Encounter: Payer: Self-pay | Admitting: *Deleted

## 2014-04-26 ENCOUNTER — Encounter: Payer: Self-pay | Admitting: Radiology

## 2014-05-17 DIAGNOSIS — R197 Diarrhea, unspecified: Secondary | ICD-10-CM | POA: Diagnosis not present

## 2014-05-17 DIAGNOSIS — K599 Functional intestinal disorder, unspecified: Secondary | ICD-10-CM | POA: Diagnosis not present

## 2014-05-17 DIAGNOSIS — K580 Irritable bowel syndrome with diarrhea: Secondary | ICD-10-CM | POA: Diagnosis not present

## 2014-05-17 DIAGNOSIS — K5730 Diverticulosis of large intestine without perforation or abscess without bleeding: Secondary | ICD-10-CM | POA: Diagnosis not present

## 2014-05-17 DIAGNOSIS — R1084 Generalized abdominal pain: Secondary | ICD-10-CM | POA: Diagnosis not present

## 2014-05-21 ENCOUNTER — Telehealth: Payer: Self-pay | Admitting: Family Medicine

## 2014-05-21 NOTE — Telephone Encounter (Signed)
Left a message for patient to come by and receive the flu shot or call and update the record with the flu shot information.

## 2014-05-27 ENCOUNTER — Encounter: Payer: Self-pay | Admitting: Cardiology

## 2014-05-27 ENCOUNTER — Ambulatory Visit (INDEPENDENT_AMBULATORY_CARE_PROVIDER_SITE_OTHER): Payer: Medicare Other | Admitting: Cardiology

## 2014-05-27 VITALS — BP 169/86 | HR 67 | Ht 65.0 in | Wt 145.0 lb

## 2014-05-27 DIAGNOSIS — I059 Rheumatic mitral valve disease, unspecified: Secondary | ICD-10-CM | POA: Diagnosis not present

## 2014-05-27 DIAGNOSIS — E878 Other disorders of electrolyte and fluid balance, not elsewhere classified: Secondary | ICD-10-CM | POA: Diagnosis not present

## 2014-05-27 DIAGNOSIS — I080 Rheumatic disorders of both mitral and aortic valves: Secondary | ICD-10-CM

## 2014-05-27 NOTE — Progress Notes (Signed)
    HPI The patient presents for followup of mitral valve prolapse.  Her echocardiogram earlier this year demonstrated only mild mitral regurgitation.  She said she's had several months. She says since going to [place] earlier this year she's had some stomach upset. She reports that she's had a GI workup. During a colonoscopy recently she was found to have trigeminy. This was during the procedure and apparently in recovery. She does not feel palpitations. She's not had presyncope or syncope. Unfortunately she thinks her exercise tolerance is reduced.    Allergies  Allergen Reactions  . Typhoid Vaccines Rash    Current Outpatient Prescriptions  Medication Sig Dispense Refill  . cetirizine (ZYRTEC) 5 MG tablet Take 5 mg by mouth daily.    Marland Kitchen glucosamine-chondroitin 500-400 MG tablet Take 1 tablet by mouth once.    . raloxifene (EVISTA) 60 MG tablet Take 60 mg by mouth daily.     No current facility-administered medications for this visit.    Past Medical History  Diagnosis Date  . Mitral valve prolapse     with moderate mitral regurgitation  . Heart murmur   . Allergy     Past Surgical History  Procedure Laterality Date  . Anterior cruciate ligament repair    . Minicus    . Tee without cardioversion  07/23/2011    Procedure: TRANSESOPHAGEAL ECHOCARDIOGRAM (TEE);  Surgeon: Loralie Champagne, MD;  Location: [facility] ENDOSCOPY;  Service: Cardiovascular;  Laterality: N/A;  . Breast biopsy      of benign lesions    ROS:  As stated in the HPI and negative for all other systems.  PHYSICAL EXAM BP 169/86 mmHg  Pulse 67  Ht 5\' 5"  (1.651 m)  Wt 145 lb (65.772 kg)  BMI 24.13 kg/m2 GENERAL:  Well appearing NECK:  No jugular venous distention, waveform within normal limits, carotid upstroke brisk and symmetric, no bruits, no thyromegaly LUNGS:  Clear to auscultation bilaterally CHEST:  Unremarkable HEART:  PMI not displaced or sustained,S1 and S2 within normal limits, no S3, no S4, positive mid  systolic click with mild brief systolic murmur, no rubs. ABD:  Flat, positive bowel sounds normal in frequency in pitch, no bruits, no rebound, no guarding, no midline pulsatile mass, no hepatomegaly, no splenomegaly EXT:  2 plus pulses throughout, no edema, no cyanosis no clubbing   ASSESSMENT AND PLAN  MITRAL REGURGITATION:  I do not suspect that this is more severe or contributing to her current complaints. No change in therapy is indicated. I will likely follow up with an echocardiogram when I see her again in 6 months.  PVCs:  She is not having any symptoms related to this. Therefore, at this point no further cardiovascular testing is suggested. I will check a basic metabolic profile and magnesium level. I do note that her thyroid is normal.  HTN:  Her blood pressure is elevated.  I asked her to get blood pressure diary and I would be happy to review this.    FATIGUE:  She thinks this is related to whatever is going on abdominally.  She will continue with workup her primary provider and GI.

## 2014-05-27 NOTE — Patient Instructions (Addendum)
Your physician recommends that you schedule a follow-up appointment in: 6 months with Dr. Percival Spanish  We are ordering labs for you to get done

## 2014-05-28 DIAGNOSIS — Z23 Encounter for immunization: Secondary | ICD-10-CM | POA: Diagnosis not present

## 2014-05-28 LAB — BASIC METABOLIC PANEL
BUN: 17 mg/dL (ref 6–23)
CHLORIDE: 104 meq/L (ref 96–112)
CO2: 27 meq/L (ref 19–32)
CREATININE: 0.85 mg/dL (ref 0.50–1.10)
Calcium: 9.7 mg/dL (ref 8.4–10.5)
Glucose, Bld: 73 mg/dL (ref 70–99)
POTASSIUM: 4.8 meq/L (ref 3.5–5.3)
Sodium: 138 mEq/L (ref 135–145)

## 2014-05-28 LAB — MAGNESIUM: Magnesium: 2.2 mg/dL (ref 1.5–2.5)

## 2014-06-04 DIAGNOSIS — Z124 Encounter for screening for malignant neoplasm of cervix: Secondary | ICD-10-CM | POA: Diagnosis not present

## 2014-06-20 ENCOUNTER — Other Ambulatory Visit: Payer: Self-pay | Admitting: Gastroenterology

## 2014-06-20 DIAGNOSIS — K580 Irritable bowel syndrome with diarrhea: Secondary | ICD-10-CM | POA: Diagnosis not present

## 2014-06-20 DIAGNOSIS — R1084 Generalized abdominal pain: Secondary | ICD-10-CM | POA: Diagnosis not present

## 2014-06-20 DIAGNOSIS — R140 Abdominal distension (gaseous): Secondary | ICD-10-CM | POA: Diagnosis not present

## 2014-07-02 ENCOUNTER — Other Ambulatory Visit: Payer: Medicare Other

## 2014-07-08 ENCOUNTER — Ambulatory Visit
Admission: RE | Admit: 2014-07-08 | Discharge: 2014-07-08 | Disposition: A | Discharge status: Home / Self Care | Payer: Medicare Other | Source: Ambulatory Visit | Attending: Gastroenterology | Admitting: Gastroenterology

## 2014-07-08 DIAGNOSIS — K8020 Calculus of gallbladder without cholecystitis without obstruction: Secondary | ICD-10-CM | POA: Diagnosis not present

## 2014-07-08 DIAGNOSIS — R1084 Generalized abdominal pain: Secondary | ICD-10-CM

## 2014-07-08 DIAGNOSIS — K5900 Constipation, unspecified: Secondary | ICD-10-CM | POA: Diagnosis not present

## 2014-07-08 DIAGNOSIS — M8580 Other specified disorders of bone density and structure, unspecified site: Secondary | ICD-10-CM | POA: Diagnosis not present

## 2014-07-08 DIAGNOSIS — R140 Abdominal distension (gaseous): Secondary | ICD-10-CM | POA: Diagnosis not present

## 2014-07-15 ENCOUNTER — Encounter (INDEPENDENT_AMBULATORY_CARE_PROVIDER_SITE_OTHER): Payer: Self-pay | Admitting: Surgery

## 2014-07-15 ENCOUNTER — Ambulatory Visit (INDEPENDENT_AMBULATORY_CARE_PROVIDER_SITE_OTHER): Payer: Self-pay | Admitting: Surgery

## 2014-07-15 DIAGNOSIS — K8020 Calculus of gallbladder without cholecystitis without obstruction: Secondary | ICD-10-CM | POA: Diagnosis not present

## 2014-07-15 NOTE — H&P (Signed)
Chief Complaint:  Intermittent cramping abdominal pain and diarrhea  History of Present Illness:  Carolyn Berger is an 69 y.o. female who began having intermittent abdominal pain prior to going on a trip to [place] last year.  During that trip she developed severe diarrhea which was thought to be travel related but it has remained although not as bad.  Since then she has seen Dr. Lou Miner and Dr. Leonie Douglas who evaluated her and treated her with courses of antispasmotics and Flagyl.  She has had probiotics and has been watching her lactose intake.  She continues to awaken at night with bloating, cramping abdominal pain sometimes associated with diarrhea.  A CT was obtained that showed some evidence of constipation and small gallstones were noted.  These were seen on a subsequent ultrasound of her gallbladder.    She was referred by Dr. Oletta Lamas with the thought that the small gallstones (3 mm) may be causing her GI complaints since there is no other obvious explanation for her pain.  Therefore she was seen in the office and laparoscopic cholecystectomy was explained to her in detail including complications not limited to bleeding, bile collections, CBD injuries-a lap chole pamphalet was given to her.  She wants to proceed with laparoscopic cholecystectomy.    Past Medical History  Diagnosis Date  . Mitral valve prolapse     with moderate mitral regurgitation  . Heart murmur   . Allergy     Past Surgical History  Procedure Laterality Date  . Anterior cruciate ligament repair    . Minicus    . Tee without cardioversion  07/23/2011    Procedure: TRANSESOPHAGEAL ECHOCARDIOGRAM (TEE);  Surgeon: Loralie Champagne, MD;  Location: [place];  Service: Cardiovascular;  Laterality: N/A;  . Breast biopsy      of benign lesions    Current Outpatient Prescriptions  Medication Sig Dispense Refill  . cetirizine (ZYRTEC) 5 MG tablet Take 5 mg by mouth daily.    Marland Kitchen glucosamine-chondroitin 500-400 MG tablet  Take 1 tablet by mouth once.    . raloxifene (EVISTA) 60 MG tablet Take 60 mg by mouth daily.     No current facility-administered medications for this visit.   Typhoid vaccines Family History  Problem Relation Age of Onset  . Cancer Mother     breast  . Cancer Father     lymphoma  . Cancer Sister     uterine  . Cancer Sister     Breast CA   Social History:   reports that she has never smoked. She does not have any smokeless tobacco history on file. She reports that she drinks alcohol. Her drug history is not on file.   REVIEW OF SYSTEMS : Negative except for hypertension which I will address with Dr. Percival Spanish.    Physical Exam:  Pulse 68  BP 178/88 There were no vitals taken for this visit. There is no weight on file to calculate BMI.  Gen:  WDWN WF NAD  Neurological: Alert and oriented to person, place, and time. Motor and sensory function is grossly intact  Head: Normocephalic and atraumatic.  Eyes: Conjunctivae are normal. Pupils are equal, round, and reactive to light. No scleral icterus.  Neck: Normal range of motion. Neck supple. No tracheal deviation or thyromegaly present. No bruits Cardiovascular:  SR without murmurs or gallops.  No carotid bruits Breast:  Not examined Respiratory: Effort normal.  No respiratory distress. No chest wall tenderness. Breath sounds normal.  No wheezes, rales or  rhonchi.  Abdomen:  nontender at present GU:  Not examined Musculoskeletal: Normal range of motion. Extremities are nontender. No cyanosis, edema or clubbing notedl.  No history of DVT. Lymphadenopathy: No cervical, preauricular, postauricular or axillary adenopathy is present Skin: Skin is warm and dry. No rash noted. No diaphoresis. No erythema. No pallor. Pscyh: Normal mood and affect. Behavior is normal. Judgment and thought content normal.   LABORATORY RESULTS: No results found for this or any previous visit (from the past 48 hour(s)).   RADIOLOGY RESULTS: CT and  ultrasound in EPIC:  IMPRESSION: 1. Mobile small gallstones are noted within gallbladder the largest measures 3 mm. 2. Normal CBD. No sonographic Murphy's sign. 3. No hydronephrosis. 4. No aortic aneurysm. Problem List: Patient Active Problem List   Diagnosis Date Noted  . Gallstones 07/15/2014  . Psoriasis 10/02/2012  . Dyspnea 07/01/2011  . HYPERLIPIDEMIA 12/23/2008  . MITRAL REGURGITATION 12/20/2008  . Mitral valve disorder 12/20/2008    Assessment & Plan: Gallstones and symptoms compatible with chronic cholecystitis Plan:  Laparoscopic cholecystectomy with IOC  Will ask Dr. Percival Spanish to advise regarding her hypertension.      Matt B. Hassell Done, MD, [facility] [phone] beeper [phone]  07/15/2014 5:21 PM

## 2014-07-30 ENCOUNTER — Telehealth: Payer: Self-pay | Admitting: Cardiology

## 2014-07-30 ENCOUNTER — Ambulatory Visit (INDEPENDENT_AMBULATORY_CARE_PROVIDER_SITE_OTHER): Payer: Medicare Other | Admitting: Internal Medicine

## 2014-07-30 VITALS — BP 120/80 | HR 98 | Temp 97.8°F | Resp 16 | Ht 64.5 in | Wt 140.0 lb

## 2014-07-30 DIAGNOSIS — J40 Bronchitis, not specified as acute or chronic: Secondary | ICD-10-CM | POA: Diagnosis not present

## 2014-07-30 MED ORDER — AZITHROMYCIN 250 MG PO TABS: ORAL_TABLET | ORAL | Status: DC

## 2014-07-30 NOTE — Telephone Encounter (Signed)
New message      Request for surgical clearance:  1. What type of surgery is being performed? gallbladder  2. When is this surgery scheduled? 2-26  Are there any medications that need to be held prior to surgery and how long? Should pt be on bp medication (177/89 last bp reading) pt said Dr Percival Spanish is supposed to call Dr Hassell Done to discuss this 3. Name of physician performing surgery? Dr Hassell Done  4. What is your office phone and fax number?

## 2014-07-30 NOTE — Patient Instructions (Signed)

## 2014-07-30 NOTE — Progress Notes (Signed)
   Subjective:    Patient ID: Carolyn Berger, female    DOB: September 14, 1945, 69 y.o.   MRN: [id number]  HPI Runny nose, exposure to bronchitis. Scheduled for cholycystectomy 3 days. No chest sxs yet.   Review of Systems     Objective:   Physical Exam  Constitutional: She is oriented to person, place, and time. She appears well-developed and well-nourished. No distress.  HENT:  Head: Normocephalic.  Right Ear: External ear normal.  Left Ear: External ear normal.  Nose: Mucosal edema, rhinorrhea and sinus tenderness present. No epistaxis.  Mouth/Throat: Oropharynx is clear and moist.  Eyes: Conjunctivae and EOM are normal. Pupils are equal, round, and reactive to light.  Neck: Normal range of motion. Neck supple.  Cardiovascular: Normal rate, regular rhythm and normal heart sounds.   Pulmonary/Chest: Effort normal and breath sounds normal.  Lymphadenopathy:    She has no cervical adenopathy.  Neurological: She is alert and oriented to person, place, and time. She exhibits normal muscle tone. Coordination normal.  Psychiatric: She has a normal mood and affect.          Assessment & Plan:  Exposure to bronchitis Zpak

## 2014-07-30 NOTE — Telephone Encounter (Signed)
Needs clearance for gallbladder surgery with Dr. Rema Jasmine

## 2014-07-31 ENCOUNTER — Encounter: Payer: Self-pay | Admitting: Internal Medicine

## 2014-07-31 NOTE — Patient Instructions (Addendum)
[place]  07/31/2014   Your procedure is scheduled on:   08-02-2014 Friday  Enter through [facility]  Entrance and follow signs to [facility]. Arrive at       0530 AM.  Call this number if you have problems the morning of surgery: [phone]  Or Presurgical Testing [phone].   For Living Will and/or Health Care Power Attorney Forms: please provide copy for your medical record,may bring AM of surgery(Forms should be already notarized -we do not provide this service).(08-01-14 Yes/ No information preferred today).  Remember: Follow any bowel prep instructions per MD office.(1 Fleet Enema night before surgery)    Do not eat food/ or drink: After Midnight.    .  Take these medicines the morning of surgery with A SIP OF WATER: Raloxifene.Cetirizine. Zpack   Do not wear jewelry, make-up or nail polish.  Do not wear deodorant, lotions, powders, or perfumes.   Do not shave legs and under arms- 48 hours(2 days) prior to first CHG shower.(Shaving face and neck okay.)  Do not bring valuables to the hospital.(Hospital is not responsible for lost valuables).  Contacts, dentures or removable bridgework, body piercing, hair pins may not be worn into surgery.  Leave suitcase in the car. After surgery it may be brought to your room.  For patients admitted to the hospital, checkout time is 11:00 AM the day of discharge.(Restricted visitors-Any Persons displaying flu-like symptoms or illness).    Patients discharged the day of surgery will not be allowed to drive home. Must have responsible person with you x 24 hours once discharged.  Name and phone number of your driver: Dr. Neldon Mc [phone] cell     Please read over the following fact sheets that you were given:  CHG(Chlorhexidine Gluconate 4% Surgical Soap) use.  Remember : Type/Screen "Blue armbands" - may not be removed once applied(would result in being retested AM of surgery, if removed).         [facility] - Preparing for Surgery Before surgery, you can play an important role.  Because skin is not sterile, your skin needs to be as free of germs as possible.  You can reduce the number of germs on your skin by washing with CHG (chlorahexidine gluconate) soap before surgery.  CHG is an antiseptic cleaner which kills germs and bonds with the skin to continue killing germs even after washing. Please DO NOT use if you have an allergy to CHG or antibacterial soaps.  If your skin becomes reddened/irritated stop using the CHG and inform your nurse when you arrive at Short Stay. Do not shave (including legs and underarms) for at least 48 hours prior to the first CHG shower.  You may shave your face/neck. Please follow these instructions carefully:  1.  Shower with CHG Soap the night before surgery and the  morning of Surgery.  2.  If you choose to wash your hair, wash your hair first as usual with your  normal  shampoo.  3.  After you shampoo, rinse your hair and body thoroughly to remove the  shampoo.                           4.  Use CHG as you would any other liquid soap.  You can apply chg directly  to the skin and wash  Gently with a scrungie or clean washcloth.  5.  Apply the CHG Soap to your body ONLY FROM THE NECK DOWN.   Do not use on face/ open                           Wound or open sores. Avoid contact with eyes, ears mouth and genitals (private parts).                       Wash face,  Genitals (private parts) with your normal soap.             6.  Wash thoroughly, paying special attention to the area where your surgery  will be performed.  7.  Thoroughly rinse your body with warm water from the neck down.  8.  DO NOT shower/wash with your normal soap after using and rinsing off  the CHG Soap.                9.  Pat yourself dry with a clean towel.            10.  Wear clean pajamas.            11.  Place clean sheets on your bed the night of your first shower and do not   sleep with pets. Day of Surgery : Do not apply any lotions/deodorants the morning of surgery.  Please wear clean clothes to the hospital/surgery center.  FAILURE TO FOLLOW THESE INSTRUCTIONS MAY RESULT IN THE CANCELLATION OF YOUR SURGERY PATIENT SIGNATURE_________________________________  NURSE SIGNATURE__________________________________  ________________________________________________________________________

## 2014-08-01 ENCOUNTER — Encounter (HOSPITAL_COMMUNITY): Payer: Self-pay

## 2014-08-01 ENCOUNTER — Encounter (HOSPITAL_COMMUNITY)
Admission: RE | Admit: 2014-08-01 | Discharge: 2014-08-01 | Disposition: A | Discharge status: Home / Self Care | Payer: Medicare Other | Source: Ambulatory Visit | Attending: Surgery | Admitting: Surgery

## 2014-08-01 DIAGNOSIS — K8020 Calculus of gallbladder without cholecystitis without obstruction: Secondary | ICD-10-CM | POA: Diagnosis not present

## 2014-08-01 DIAGNOSIS — I341 Nonrheumatic mitral (valve) prolapse: Secondary | ICD-10-CM | POA: Diagnosis not present

## 2014-08-01 HISTORY — DX: Gastro-esophageal reflux disease without esophagitis: K21.9

## 2014-08-01 LAB — CBC
HEMATOCRIT: 41.8 % (ref 36.0–46.0)
HEMOGLOBIN: 13.5 g/dL (ref 12.0–15.0)
MCH: 29.5 pg (ref 26.0–34.0)
MCHC: 32.3 g/dL (ref 30.0–36.0)
MCV: 91.3 fL (ref 78.0–100.0)
Platelets: 240 10*3/uL (ref 150–400)
RBC: 4.58 MIL/uL (ref 3.87–5.11)
RDW: 13.3 % (ref 11.5–15.5)
WBC: 5 10*3/uL (ref 4.0–10.5)

## 2014-08-01 LAB — BASIC METABOLIC PANEL
Anion gap: 9 (ref 5–15)
BUN: 12 mg/dL (ref 6–23)
CALCIUM: 10.1 mg/dL (ref 8.4–10.5)
CO2: 27 mmol/L (ref 19–32)
CREATININE: 0.7 mg/dL (ref 0.50–1.10)
Chloride: 107 mmol/L (ref 96–112)
GFR calc Af Amer: >90 mL/min (ref >90)
GFR, EST NON AFRICAN AMERICAN: 86 mL/min — AB (ref >90)
GLUCOSE: 92 mg/dL (ref 70–99)
Potassium: 5.1 mmol/L (ref 3.5–5.1)
Sodium: 143 mmol/L (ref 135–145)

## 2014-08-01 NOTE — Anesthesia Preprocedure Evaluation (Addendum)
Anesthesia Evaluation  Patient identified by MRN, date of birth, ID band Patient awake    Reviewed: Allergy & Precautions, NPO status , Patient's Chart, lab work & pertinent test results  History of Anesthesia Complications Negative for: history of anesthetic complications  Airway Mallampati: II  TM Distance: <3 FB Neck ROM: Full    Dental  (+) Teeth Intact, Dental Advisory Given   Pulmonary neg pulmonary ROS,    Pulmonary exam normal       Cardiovascular negative cardio ROS      Neuro/Psych negative neurological ROS  negative psych ROS   GI/Hepatic Neg liver ROS, GERD-  ,  Endo/Other  negative endocrine ROS  Renal/GU negative Renal ROS     Musculoskeletal   Abdominal   Peds  Hematology   Anesthesia Other Findings   Reproductive/Obstetrics                            Anesthesia Physical Anesthesia Plan  ASA: II  Anesthesia Plan: General   Post-op Pain Management:    Induction: Intravenous  Airway Management Planned: Oral ETT  Additional Equipment:   Intra-op Plan:   Post-operative Plan: Extubation in OR  Informed Consent: I have reviewed the patients History and Physical, chart, labs and discussed the procedure including the risks, benefits and alternatives for the proposed anesthesia with the patient or authorized representative who has indicated his/her understanding and acceptance.   Dental advisory given  Plan Discussed with: CRNA and Surgeon  Anesthesia Plan Comments:        Anesthesia Quick Evaluation

## 2014-08-01 NOTE — Pre-Procedure Instructions (Signed)
08-01-14 Echo 6'15, EKG 5'15, CXR 10'15, CT abd/pelvis !0-30-15 Epic.

## 2014-08-01 NOTE — Telephone Encounter (Signed)
I sent a message to my nurse to start Norvasc 2.5 mg daily.  Also, based on ACC/AHA guidelines, the patient would be at acceptable risk for the planned procedure without further cardiovascular testing.

## 2014-08-02 ENCOUNTER — Other Ambulatory Visit: Payer: Self-pay | Admitting: Cardiology

## 2014-08-02 ENCOUNTER — Encounter (HOSPITAL_COMMUNITY)
Admission: RE | Disposition: A | Discharge status: Home / Self Care | Payer: Self-pay | Source: Ambulatory Visit | Attending: Surgery

## 2014-08-02 ENCOUNTER — Other Ambulatory Visit: Payer: Self-pay | Admitting: *Deleted

## 2014-08-02 ENCOUNTER — Ambulatory Visit (HOSPITAL_COMMUNITY): Payer: Medicare Other

## 2014-08-02 ENCOUNTER — Ambulatory Visit (HOSPITAL_COMMUNITY): Payer: Medicare Other | Admitting: Anesthesiology

## 2014-08-02 ENCOUNTER — Encounter (HOSPITAL_COMMUNITY): Payer: Self-pay | Admitting: *Deleted

## 2014-08-02 ENCOUNTER — Observation Stay (HOSPITAL_COMMUNITY)
Admission: RE | Admit: 2014-08-02 | Discharge: 2014-08-02 | Disposition: A | Discharge status: Home / Self Care | Payer: Medicare Other | Source: Ambulatory Visit | Attending: Surgery | Admitting: Surgery

## 2014-08-02 DIAGNOSIS — I341 Nonrheumatic mitral (valve) prolapse: Secondary | ICD-10-CM | POA: Diagnosis not present

## 2014-08-02 DIAGNOSIS — K219 Gastro-esophageal reflux disease without esophagitis: Secondary | ICD-10-CM | POA: Diagnosis not present

## 2014-08-02 DIAGNOSIS — K8020 Calculus of gallbladder without cholecystitis without obstruction: Principal | ICD-10-CM | POA: Insufficient documentation

## 2014-08-02 DIAGNOSIS — K8010 Calculus of gallbladder with chronic cholecystitis without obstruction: Secondary | ICD-10-CM | POA: Diagnosis not present

## 2014-08-02 DIAGNOSIS — Z9049 Acquired absence of other specified parts of digestive tract: Secondary | ICD-10-CM

## 2014-08-02 DIAGNOSIS — Z419 Encounter for procedure for purposes other than remedying health state, unspecified: Secondary | ICD-10-CM

## 2014-08-02 DIAGNOSIS — K811 Chronic cholecystitis: Secondary | ICD-10-CM | POA: Diagnosis not present

## 2014-08-02 HISTORY — PX: CHOLECYSTECTOMY: SHX55

## 2014-08-02 LAB — CREATININE, SERUM
Creatinine, Ser: 0.8 mg/dL (ref 0.50–1.10)
GFR calc non Af Amer: 74 mL/min — ABNORMAL LOW (ref >90)
GFR, EST AFRICAN AMERICAN: 85 mL/min — AB (ref >90)

## 2014-08-02 LAB — CBC
HEMATOCRIT: 39.4 % (ref 36.0–46.0)
HEMOGLOBIN: 12.5 g/dL (ref 12.0–15.0)
MCH: 29.2 pg (ref 26.0–34.0)
MCHC: 31.7 g/dL (ref 30.0–36.0)
MCV: 92.1 fL (ref 78.0–100.0)
Platelets: 217 10*3/uL (ref 150–400)
RBC: 4.28 MIL/uL (ref 3.87–5.11)
RDW: 13.5 % (ref 11.5–15.5)
WBC: 9.3 10*3/uL (ref 4.0–10.5)

## 2014-08-02 SURGERY — LAPAROSCOPIC CHOLECYSTECTOMY WITH INTRAOPERATIVE CHOLANGIOGRAM
Anesthesia: General | Site: Abdomen

## 2014-08-02 MED ORDER — HEPARIN SODIUM (PORCINE) 5000 UNIT/ML IJ SOLN
5000.0000 [IU] | Freq: Three times a day (TID) | INTRAMUSCULAR | Status: DC
  Filled 2014-08-02 (×2): qty 1

## 2014-08-02 MED ORDER — ONDANSETRON HCL 4 MG/2ML IJ SOLN
INTRAMUSCULAR | Status: AC
  Filled 2014-08-02: qty 2

## 2014-08-02 MED ORDER — ROCURONIUM BROMIDE 100 MG/10ML IV SOLN
INTRAVENOUS | Status: AC
  Filled 2014-08-02: qty 1

## 2014-08-02 MED ORDER — PROPOFOL 10 MG/ML IV BOLUS
INTRAVENOUS | Status: DC | PRN
  Administered 2014-08-02: 120 mg via INTRAVENOUS

## 2014-08-02 MED ORDER — LIDOCAINE HCL (CARDIAC) 20 MG/ML IV SOLN
INTRAVENOUS | Status: DC | PRN
  Administered 2014-08-02: 40 mg via INTRAVENOUS

## 2014-08-02 MED ORDER — DEXAMETHASONE SODIUM PHOSPHATE 10 MG/ML IJ SOLN
INTRAMUSCULAR | Status: DC | PRN
  Administered 2014-08-02: 10 mg via INTRAVENOUS

## 2014-08-02 MED ORDER — PROMETHAZINE HCL 25 MG/ML IJ SOLN: 6.2500 mg | INTRAMUSCULAR | Status: DC | PRN

## 2014-08-02 MED ORDER — SODIUM CHLORIDE 0.9 % IJ SOLN
INTRAMUSCULAR | Status: AC
  Filled 2014-08-02: qty 10

## 2014-08-02 MED ORDER — HYDROMORPHONE HCL 1 MG/ML IJ SOLN: 0.5000 mg | INTRAMUSCULAR | Status: DC | PRN

## 2014-08-02 MED ORDER — LACTATED RINGERS IV SOLN: INTRAVENOUS | Status: DC

## 2014-08-02 MED ORDER — GLYCOPYRROLATE 0.2 MG/ML IJ SOLN
INTRAMUSCULAR | Status: DC | PRN
  Administered 2014-08-02: 0.4 mg via INTRAVENOUS

## 2014-08-02 MED ORDER — CEFAZOLIN SODIUM-DEXTROSE 2-3 GM-% IV SOLR
2.0000 g | INTRAVENOUS | Status: AC
  Administered 2014-08-02: 2 g via INTRAVENOUS

## 2014-08-02 MED ORDER — OXYCODONE-ACETAMINOPHEN 5-325 MG PO TABS: 1.0000 | ORAL_TABLET | ORAL | Status: DC | PRN

## 2014-08-02 MED ORDER — LACTATED RINGERS IR SOLN
Status: DC | PRN
Start: 2014-08-02 — End: 2014-08-02
  Administered 2014-08-02: 1000 mL

## 2014-08-02 MED ORDER — BUPIVACAINE-EPINEPHRINE (PF) 0.25% -1:200000 IJ SOLN
INTRAMUSCULAR | Status: AC
  Filled 2014-08-02: qty 30

## 2014-08-02 MED ORDER — HYDROMORPHONE HCL 1 MG/ML IJ SOLN
INTRAMUSCULAR | Status: AC
  Filled 2014-08-02: qty 1

## 2014-08-02 MED ORDER — LIDOCAINE HCL (CARDIAC) 20 MG/ML IV SOLN
INTRAVENOUS | Status: AC
  Filled 2014-08-02: qty 5

## 2014-08-02 MED ORDER — LABETALOL HCL 5 MG/ML IV SOLN
INTRAVENOUS | Status: DC | PRN
  Administered 2014-08-02: 5 mg via INTRAVENOUS

## 2014-08-02 MED ORDER — HYDROMORPHONE HCL 1 MG/ML IJ SOLN
0.2500 mg | INTRAMUSCULAR | Status: DC | PRN
  Administered 2014-08-02: 0.25 mg via INTRAVENOUS
  Administered 2014-08-02: 0.5 mg via INTRAVENOUS
  Administered 2014-08-02: 0.25 mg via INTRAVENOUS

## 2014-08-02 MED ORDER — DEXAMETHASONE SODIUM PHOSPHATE 10 MG/ML IJ SOLN
INTRAMUSCULAR | Status: AC
Start: 2014-08-02 — End: 2014-08-02
  Filled 2014-08-02: qty 1

## 2014-08-02 MED ORDER — NEOSTIGMINE METHYLSULFATE 10 MG/10ML IV SOLN
INTRAVENOUS | Status: AC
  Filled 2014-08-02: qty 1

## 2014-08-02 MED ORDER — GLYCOPYRROLATE 0.2 MG/ML IJ SOLN
INTRAMUSCULAR | Status: AC
  Filled 2014-08-02: qty 2

## 2014-08-02 MED ORDER — SCOPOLAMINE 1 MG/3DAYS TD PT72
MEDICATED_PATCH | TRANSDERMAL | Status: DC | PRN
  Administered 2014-08-02: 1 via TRANSDERMAL

## 2014-08-02 MED ORDER — CHLORHEXIDINE GLUCONATE 4 % EX LIQD: 1.0000 "application " | Freq: Once | CUTANEOUS | Status: DC

## 2014-08-02 MED ORDER — ONDANSETRON HCL 4 MG/2ML IJ SOLN
INTRAMUSCULAR | Status: DC | PRN
  Administered 2014-08-02: 4 mg via INTRAVENOUS

## 2014-08-02 MED ORDER — AMLODIPINE BESYLATE 2.5 MG PO TABS: 2.5000 mg | ORAL_TABLET | Freq: Every day | ORAL | Status: DC

## 2014-08-02 MED ORDER — FENTANYL CITRATE 0.05 MG/ML IJ SOLN
INTRAMUSCULAR | Status: DC | PRN
  Administered 2014-08-02 (×3): 50 ug via INTRAVENOUS
  Administered 2014-08-02 (×2): 25 ug via INTRAVENOUS

## 2014-08-02 MED ORDER — OXYCODONE-ACETAMINOPHEN 5-325 MG PO TABS
1.0000 | ORAL_TABLET | ORAL | Status: DC | PRN
Start: 2014-08-02 — End: 2014-08-02
  Administered 2014-08-02: 1 via ORAL
  Filled 2014-08-02: qty 1

## 2014-08-02 MED ORDER — CEFAZOLIN SODIUM-DEXTROSE 2-3 GM-% IV SOLR
INTRAVENOUS | Status: AC
  Filled 2014-08-02: qty 50

## 2014-08-02 MED ORDER — 0.9 % SODIUM CHLORIDE (POUR BTL) OPTIME
TOPICAL | Status: DC | PRN
  Administered 2014-08-02: 1000 mL

## 2014-08-02 MED ORDER — ONDANSETRON HCL 4 MG/2ML IJ SOLN: 4.0000 mg | Freq: Four times a day (QID) | INTRAMUSCULAR | Status: DC | PRN

## 2014-08-02 MED ORDER — LACTATED RINGERS IV SOLN
INTRAVENOUS | Status: DC | PRN
  Administered 2014-08-02: 07:00:00 via INTRAVENOUS

## 2014-08-02 MED ORDER — SCOPOLAMINE 1 MG/3DAYS TD PT72
MEDICATED_PATCH | TRANSDERMAL | Status: AC
  Filled 2014-08-02: qty 1

## 2014-08-02 MED ORDER — PROPOFOL 10 MG/ML IV BOLUS
INTRAVENOUS | Status: AC
  Filled 2014-08-02: qty 20

## 2014-08-02 MED ORDER — KCL IN DEXTROSE-NACL 20-5-0.45 MEQ/L-%-% IV SOLN
INTRAVENOUS | Status: DC
  Filled 2014-08-02: qty 1000

## 2014-08-02 MED ORDER — HEPARIN SODIUM (PORCINE) 5000 UNIT/ML IJ SOLN
5000.0000 [IU] | Freq: Once | INTRAMUSCULAR | Status: AC
  Administered 2014-08-02: 5000 [IU] via SUBCUTANEOUS
  Filled 2014-08-02: qty 1

## 2014-08-02 MED ORDER — NEOSTIGMINE METHYLSULFATE 10 MG/10ML IV SOLN
INTRAVENOUS | Status: DC | PRN
  Administered 2014-08-02: 3 mg via INTRAVENOUS

## 2014-08-02 MED ORDER — EPHEDRINE SULFATE 50 MG/ML IJ SOLN
INTRAMUSCULAR | Status: AC
  Filled 2014-08-02: qty 1

## 2014-08-02 MED ORDER — LABETALOL HCL 5 MG/ML IV SOLN
INTRAVENOUS | Status: AC
  Filled 2014-08-02: qty 4

## 2014-08-02 MED ORDER — OXYCODONE HCL 5 MG/5ML PO SOLN
5.0000 mg | Freq: Once | ORAL | Status: DC | PRN
  Filled 2014-08-02: qty 5

## 2014-08-02 MED ORDER — FENTANYL CITRATE 0.05 MG/ML IJ SOLN
INTRAMUSCULAR | Status: AC
Start: 2014-08-02 — End: 2014-08-02
  Filled 2014-08-02: qty 5

## 2014-08-02 MED ORDER — OXYCODONE HCL 5 MG PO TABS: 5.0000 mg | ORAL_TABLET | Freq: Once | ORAL | Status: DC | PRN

## 2014-08-02 MED ORDER — ONDANSETRON HCL 4 MG PO TABS: 4.0000 mg | ORAL_TABLET | Freq: Four times a day (QID) | ORAL | Status: DC | PRN

## 2014-08-02 MED ORDER — ACETAMINOPHEN 10 MG/ML IV SOLN
1000.0000 mg | Freq: Once | INTRAVENOUS | Status: AC
  Administered 2014-08-02: 1000 mg via INTRAVENOUS
  Filled 2014-08-02: qty 100

## 2014-08-02 MED ORDER — BUPIVACAINE LIPOSOME 1.3 % IJ SUSP
20.0000 mL | Freq: Once | INTRAMUSCULAR | Status: AC
  Administered 2014-08-02: 20 mL
  Filled 2014-08-02: qty 20

## 2014-08-02 MED ORDER — SODIUM CHLORIDE 0.9 % IV SOLN
INTRAVENOUS | Status: DC | PRN
  Administered 2014-08-02: 10 mL

## 2014-08-02 MED ORDER — ROCURONIUM BROMIDE 100 MG/10ML IV SOLN
INTRAVENOUS | Status: DC | PRN
  Administered 2014-08-02: 30 mg via INTRAVENOUS
  Administered 2014-08-02: 5 mg via INTRAVENOUS
  Administered 2014-08-02: 10 mg via INTRAVENOUS

## 2014-08-02 SURGICAL SUPPLY — 47 items
APL SKNCLS STERI-STRIP NONHPOA (GAUZE/BANDAGES/DRESSINGS)
APPLIER CLIP ROT 10 11.4 M/L (STAPLE)
APR CLP MED LRG 11.4X10 (STAPLE)
BAG SPEC RTRVL 10 TROC 200 (ENDOMECHANICALS)
BENZOIN TINCTURE PRP APPL 2/3 (GAUZE/BANDAGES/DRESSINGS) IMPLANT
CABLE HIGH FREQUENCY MONO STRZ (ELECTRODE) ×2 IMPLANT
CATH REDDICK CHOLANGI 4FR 50CM (CATHETERS) ×3 IMPLANT
CLIP APPLIE ROT 10 11.4 M/L (STAPLE) ×1 IMPLANT
CLOSURE WOUND 1/2 X4 (GAUZE/BANDAGES/DRESSINGS)
COVER MAYO STAND STRL (DRAPES) ×3 IMPLANT
DECANTER SPIKE VIAL GLASS SM (MISCELLANEOUS) ×1 IMPLANT
DRAPE C-ARM 42X120 X-RAY (DRAPES) ×3 IMPLANT
DRAPE LAPAROSCOPIC ABDOMINAL (DRAPES) ×3 IMPLANT
ELECT REM PT RETURN 9FT ADLT (ELECTROSURGICAL) ×3
ELECTRODE REM PT RTRN 9FT ADLT (ELECTROSURGICAL) ×1 IMPLANT
GLOVE BIOGEL M 8.0 STRL (GLOVE) ×3 IMPLANT
GLOVE BIOGEL PI IND STRL 6.5 (GLOVE) IMPLANT
GLOVE BIOGEL PI IND STRL 7.0 (GLOVE) IMPLANT
GLOVE BIOGEL PI IND STRL 7.5 (GLOVE) IMPLANT
GLOVE BIOGEL PI INDICATOR 6.5 (GLOVE) ×2
GLOVE BIOGEL PI INDICATOR 7.0 (GLOVE) ×6
GLOVE BIOGEL PI INDICATOR 7.5 (GLOVE) ×2
GLOVE ECLIPSE 6.5 STRL STRAW (GLOVE) ×2 IMPLANT
GLOVE ECLIPSE 7.5 STRL STRAW (GLOVE) ×2 IMPLANT
GOWN STRL REUS W/TWL XL LVL3 (GOWN DISPOSABLE) ×12 IMPLANT
HEMOSTAT SURGICEL 4X8 (HEMOSTASIS) IMPLANT
IV CATH 14GX2 1/4 (CATHETERS) ×3 IMPLANT
KIT BASIN OR (CUSTOM PROCEDURE TRAY) ×3 IMPLANT
LIQUID BAND (GAUZE/BANDAGES/DRESSINGS) ×3 IMPLANT
POUCH RETRIEVAL ECOSAC 10 (ENDOMECHANICALS) IMPLANT
POUCH RETRIEVAL ECOSAC 10MM (ENDOMECHANICALS)
SCISSORS LAP 5X45 EPIX DISP (ENDOMECHANICALS) ×3 IMPLANT
SCRUB PCMX 4 OZ (MISCELLANEOUS) ×3 IMPLANT
SET IRRIG TUBING LAPAROSCOPIC (IRRIGATION / IRRIGATOR) ×3 IMPLANT
SLEEVE XCEL OPT CAN 5 100 (ENDOMECHANICALS) ×6 IMPLANT
STRIP CLOSURE SKIN 1/2X4 (GAUZE/BANDAGES/DRESSINGS) IMPLANT
SUT VIC AB 4-0 SH 18 (SUTURE) ×3 IMPLANT
SYR 20CC LL (SYRINGE) ×3 IMPLANT
SYS RETRIEVAL 5MM INZII UNIV (BASKET) ×3
SYSTEM RETRIEVL 5MM INZII UNIV (BASKET) IMPLANT
TOWEL OR 17X26 10 PK STRL BLUE (TOWEL DISPOSABLE) ×3 IMPLANT
TRAY LAPAROSCOPIC (CUSTOM PROCEDURE TRAY) ×3 IMPLANT
TROCAR BLADELESS LG 2/3X100 (TROCAR) ×2 IMPLANT
TROCAR BLADELESS OPT 5 100 (ENDOMECHANICALS) ×1 IMPLANT
TROCAR BLADELESS OPT 5 75 (ENDOMECHANICALS) ×2 IMPLANT
TROCAR XCEL BLUNT TIP 100MML (ENDOMECHANICALS) IMPLANT
TROCAR XCEL NON-BLD 11X100MML (ENDOMECHANICALS) ×3 IMPLANT

## 2014-08-02 NOTE — Anesthesia Postprocedure Evaluation (Signed)
Anesthesia Post Note  Patient: Carolyn Berger  Procedure(s) Performed: Procedure(s) (LRB): LAPAROSCOPIC CHOLECYSTECTOMY WITH INTRAOPERATIVE CHOLANGIOGRAM (N/A)  Anesthesia type: general  Patient location: PACU  Post pain: Pain level controlled  Post assessment: Patient's Cardiovascular Status Stable  Last Vitals:  Filed Vitals:   08/02/14 1030  BP: 150/65  Pulse: 49  Temp:   Resp: 13    Post vital signs: Reviewed and stable  Level of consciousness: sedated  Complications: No apparent anesthesia complications

## 2014-08-02 NOTE — Discharge Summary (Signed)
Physician Discharge Summary  Patient ID: Carolyn Berger MRN: [id number] DOB/AGE: August 31, 1945 69 y.o.  Admit date: 08/02/2014 Discharge date: 08/02/2014  Admission Diagnoses:  Chronic cholecystitis   Discharge Diagnoses:  same  Active Problems:   History of laparoscopic cholecystectomy   Surgery:  Laparoscopic cholecystectomy with IOC  Discharged Condition: improved  Hospital Course:   Had lap chole with IOC and enterolysis.  Tolerated procedure well.  Ready for discharge a few hours after surgery  Consults: none  Significant Diagnostic Studies: IOC    Discharge Exam: Blood pressure 139/60, pulse 85, temperature 97.4 F (36.3 C), temperature source Oral, resp. rate 18, height 5\' 5"  (1.651 m), weight 141 lb (63.957 kg), SpO2 97 %. Incisions covered with Liquiban.  Patient discharged by phone  Disposition: 01-Home or Self Care  Discharge Instructions    Diet - low sodium heart healthy    Complete by:  As directed      Increase activity slowly    Complete by:  As directed      No wound care    Complete by:  As directed             Medication List    TAKE these medications        azithromycin 250 MG tablet  Commonly known as:  ZITHROMAX  Use as directed     cetirizine 5 MG tablet  Commonly known as:  ZYRTEC  Take 5 mg by mouth every morning.     clobetasol ointment 0.05 %  Commonly known as:  TEMOVATE  Apply 1 application topically 2 (two) times daily as needed (For psoriasis.).     Desoximetasone 0.25 % ointment  Commonly known as:  TOPICORT  Apply 1 application topically 2 (two) times daily as needed (For psoriasis.).     glucosamine-chondroitin 500-400 MG tablet  Take 2 tablets by mouth every morning.     oxyCODONE-acetaminophen 5-325 MG per tablet  Commonly known as:  PERCOCET/ROXICET  Take 1-2 tablets by mouth every 4 (four) hours as needed for moderate pain.     Polyethyl Glycol-Propyl Glycol 0.4-0.3 % Soln  Place 2 drops into both eyes 4 (four)  times daily as needed (For dry eyes.).     raloxifene 60 MG tablet  Commonly known as:  EVISTA  Take 60 mg by mouth every morning.     VAGIFEM 10 MCG Tabs vaginal tablet  Generic drug:  Estradiol  Place 1 tablet vaginally daily.           Follow-up Information    Follow up with Johnathan Hausen B, MD In 3 weeks.   Specialty:  General Surgery   Contact information:   [place] [address] [place] [id number] [phone]       Signed: Pedro Earls 08/02/2014, 2:43 PM

## 2014-08-02 NOTE — H&P (View-Only) (Signed)
Chief Complaint:  Intermittent cramping abdominal pain and diarrhea  History of Present Illness:  Carolyn Berger is an 69 y.o. female who began having intermittent abdominal pain prior to going on a trip to [place] last year.  During that trip she developed severe diarrhea which was thought to be travel related but it has remained although not as bad.  Since then she has seen Dr. Lou Miner and Dr. Leonie Douglas who evaluated her and treated her with courses of antispasmotics and Flagyl.  She has had probiotics and has been watching her lactose intake.  She continues to awaken at night with bloating, cramping abdominal pain sometimes associated with diarrhea.  A CT was obtained that showed some evidence of constipation and small gallstones were noted.  These were seen on a subsequent ultrasound of her gallbladder.    She was referred by Dr. Oletta Lamas with the thought that the small gallstones (3 mm) may be causing her GI complaints since there is no other obvious explanation for her pain.  Therefore she was seen in the office and laparoscopic cholecystectomy was explained to her in detail including complications not limited to bleeding, bile collections, CBD injuries-a lap chole pamphalet was given to her.  She wants to proceed with laparoscopic cholecystectomy.    Past Medical History  Diagnosis Date  . Mitral valve prolapse     with moderate mitral regurgitation  . Heart murmur   . Allergy     Past Surgical History  Procedure Laterality Date  . Anterior cruciate ligament repair    . Minicus    . Tee without cardioversion  07/23/2011    Procedure: TRANSESOPHAGEAL ECHOCARDIOGRAM (TEE);  Surgeon: Loralie Champagne, MD;  Location: [place];  Service: Cardiovascular;  Laterality: N/A;  . Breast biopsy      of benign lesions    Current Outpatient Prescriptions  Medication Sig Dispense Refill  . cetirizine (ZYRTEC) 5 MG tablet Take 5 mg by mouth daily.    Marland Kitchen glucosamine-chondroitin 500-400 MG tablet  Take 1 tablet by mouth once.    . raloxifene (EVISTA) 60 MG tablet Take 60 mg by mouth daily.     No current facility-administered medications for this visit.   Typhoid vaccines Family History  Problem Relation Age of Onset  . Cancer Mother     breast  . Cancer Father     lymphoma  . Cancer Sister     uterine  . Cancer Sister     Breast CA   Social History:   reports that she has never smoked. She does not have any smokeless tobacco history on file. She reports that she drinks alcohol. Her drug history is not on file.   REVIEW OF SYSTEMS : Negative except for hypertension which I will address with Dr. Percival Spanish.    Physical Exam:  Pulse 68  BP 178/88 There were no vitals taken for this visit. There is no weight on file to calculate BMI.  Gen:  WDWN WF NAD  Neurological: Alert and oriented to person, place, and time. Motor and sensory function is grossly intact  Head: Normocephalic and atraumatic.  Eyes: Conjunctivae are normal. Pupils are equal, round, and reactive to light. No scleral icterus.  Neck: Normal range of motion. Neck supple. No tracheal deviation or thyromegaly present. No bruits Cardiovascular:  SR without murmurs or gallops.  No carotid bruits Breast:  Not examined Respiratory: Effort normal.  No respiratory distress. No chest wall tenderness. Breath sounds normal.  No wheezes, rales or  rhonchi.  Abdomen:  nontender at present GU:  Not examined Musculoskeletal: Normal range of motion. Extremities are nontender. No cyanosis, edema or clubbing notedl.  No history of DVT. Lymphadenopathy: No cervical, preauricular, postauricular or axillary adenopathy is present Skin: Skin is warm and dry. No rash noted. No diaphoresis. No erythema. No pallor. Pscyh: Normal mood and affect. Behavior is normal. Judgment and thought content normal.   LABORATORY RESULTS: No results found for this or any previous visit (from the past 48 hour(s)).   RADIOLOGY RESULTS: CT and  ultrasound in EPIC:  IMPRESSION: 1. Mobile small gallstones are noted within gallbladder the largest measures 3 mm. 2. Normal CBD. No sonographic Murphy's sign. 3. No hydronephrosis. 4. No aortic aneurysm. Problem List: Patient Active Problem List   Diagnosis Date Noted  . Gallstones 07/15/2014  . Psoriasis 10/02/2012  . Dyspnea 07/01/2011  . HYPERLIPIDEMIA 12/23/2008  . MITRAL REGURGITATION 12/20/2008  . Mitral valve disorder 12/20/2008    Assessment & Plan: Gallstones and symptoms compatible with chronic cholecystitis Plan:  Laparoscopic cholecystectomy with IOC  Will ask Dr. Percival Spanish to advise regarding her hypertension.      Matt B. Hassell Done, MD, [facility] [phone] beeper [phone]  07/15/2014 5:21 PM

## 2014-08-02 NOTE — Progress Notes (Signed)
Pt arrived to room 1332 via stretcher from PACU.  Pt is alert and oriented. Husband is at bedside. Oriented pt and husband to room and unit. Call bell is within reach. Will continue to monitor.

## 2014-08-02 NOTE — Care Management Note (Signed)
    Page 1 of 1   08/02/2014     3:51:26 PM CARE MANAGEMENT NOTE 08/02/2014  Patient:  Carolyn Berger, Carolyn Berger   Account Number:  [id number]  Date Initiated:  08/02/2014  Documentation initiated by:  Dessa Phi  Subjective/Objective Assessment:   69 y/o f admitted w/cholelithiasis.     Action/Plan:   from home.   Anticipated DC Date:  08/02/2014   Anticipated DC Plan:  [place]  CM consult      Choice offered to / List presented to:             Status of service:  Completed, signed off Medicare Important Message given?   (If response is "NO", the following Medicare IM given date fields will be blank) Date Medicare IM given:   Medicare IM given by:   Date Additional Medicare IM given:   Additional Medicare IM given by:    Discharge Disposition:  HOME/SELF CARE  Per UR Regulation:  Reviewed for med. necessity/level of care/duration of stay  If discussed at [place] of Stay Meetings, dates discussed:    Comments:  08/02/14 Dessa Phi RN BSN NCM [phone] POD#1 lap chole. d/c home no needs or orders.

## 2014-08-02 NOTE — Op Note (Signed)
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  Adhesions to the left upper quadrant  Description of Procedure: The patient was taken to OR 6 and given general anesthesia.  The patient was prepped with PCMX and draped sterilely. A time out was performed.  Access to the abdomen was achieved with a 5 mm Optiview through the right upper quadrant.  Port placement included a 3 mm lateral trocar and a 5 mm in the umbilical region and a 5 mm in the upper midline  First we inspected the pelvis and visualized the uterus and severed fallopian tubes.  The ovaries were seen and were not enlarged and appeared normal.    The gallbladder was visualized and the fundus was grasped and the gallbladder was elevated. Traction on the infundibulum allowed for successful demonstration of the critical view. Inflammatory changes were chronic and were impressive to the omentum and to the duodenum.  The cystic duct was identified and clipped up on the gallbladder and an incision was made in the cystic duct and the Reddick catheter was inserted after milking the cystic duct of any debris. A dynamic cholangiogram was performed which demonstrated a long cystic duct and intrahepatic filling and free flow into the duodenum.    The cystic duct was then triple clipped and divided, the cystic artery was double clipped and divided and then the gallbladder was removed from the gallbladder bed. Removal of the gallbladder from the gallbladder bed was performed without entering it.  The gallbladder was then placed in a bag and brought out through the 5 mm umbilical port. The gallbladder bed was inspected and no bleeding or bile leaks were  seen.   Laparoscopic visualization was used when closing the 5 mm trocar site in the umbilicus.   Incisions were injected with Exparel and closed with 4-0 Vicryl and Liquiban on the skin.  Sponge and needle count were correct.    The patient was taken to the recovery room in satisfactory condition.

## 2014-08-02 NOTE — Progress Notes (Signed)
Pt discharged home with husband. Reviewed follow up and medications. There were no further questions.

## 2014-08-02 NOTE — Discharge Instructions (Signed)
Activity ad lib May begin antihypertensive medications May drive when not taking narcotics for pain

## 2014-08-02 NOTE — Transfer of Care (Signed)
Immediate Anesthesia Transfer of Care Note  Patient: Carolyn Berger  Procedure(s) Performed: Procedure(s): LAPAROSCOPIC CHOLECYSTECTOMY WITH INTRAOPERATIVE CHOLANGIOGRAM (N/A)  Patient Location: PACU  Anesthesia Type:General  Level of Consciousness: awake, alert  and oriented  Airway & Oxygen Therapy: Patient Spontanous Breathing and Patient connected to face mask oxygen  Post-op Assessment: Report given to RN and Post -op Vital signs reviewed and stable  Post vital signs: Reviewed and stable  Last Vitals:  Filed Vitals:   08/02/14 0535  BP: 144/72  Pulse: 81  Temp: 36.4 C  Resp: 18    Complications: No apparent anesthesia complications

## 2014-08-02 NOTE — Interval H&P Note (Signed)
History and Physical Interval Note:  08/02/2014 7:27 AM  Carolyn Berger  has presented today for surgery, with the diagnosis of GALLSTONES AND CHRONIC CHOLELITHIASIS  The various methods of treatment have been discussed with the patient and family. After consideration of risks, benefits and other options for treatment, the patient has consented to  Procedure(s): LAPAROSCOPIC CHOLECYSTECTOMY WITH INTRAOPERATIVE CHOLANGIOGRAM (N/A) as a surgical intervention .  The patient's history has been reviewed, patient examined, no change in status, stable for surgery.  I have reviewed the patient's chart and labs.  Questions were answered to the patient's satisfaction.     [name] B

## 2014-08-02 NOTE — Telephone Encounter (Signed)
Pt. Called and informed and norvasc  2.5 sent to pharmacy

## 2014-08-05 ENCOUNTER — Encounter (HOSPITAL_COMMUNITY): Payer: Self-pay | Admitting: Surgery

## 2014-08-05 ENCOUNTER — Other Ambulatory Visit: Payer: Self-pay | Admitting: Cardiology

## 2014-08-05 ENCOUNTER — Other Ambulatory Visit: Payer: Self-pay | Admitting: *Deleted

## 2014-08-05 ENCOUNTER — Encounter: Payer: Self-pay | Admitting: *Deleted

## 2014-08-05 MED ORDER — AMLODIPINE BESYLATE 2.5 MG PO TABS: 2.5000 mg | ORAL_TABLET | Freq: Every day | ORAL | Status: DC

## 2014-10-09 ENCOUNTER — Telehealth: Payer: Self-pay | Admitting: Genetic Counselor

## 2014-10-09 ENCOUNTER — Encounter: Payer: Self-pay | Admitting: Genetic Counselor

## 2014-10-09 DIAGNOSIS — Z1379 Encounter for other screening for genetic and chromosomal anomalies: Secondary | ICD-10-CM | POA: Insufficient documentation

## 2014-10-09 NOTE — Telephone Encounter (Signed)
Revealed that patient did not carry the MSH2 VUS that had been found in her sisters.  Discussed that a "family study" report was issued, and the patient was listed as "sister", but it is under her sister's name.  Will send a copy of the report via [place] post.

## 2014-10-22 ENCOUNTER — Encounter: Payer: Self-pay | Admitting: Cardiology

## 2014-10-22 ENCOUNTER — Ambulatory Visit (INDEPENDENT_AMBULATORY_CARE_PROVIDER_SITE_OTHER): Payer: Medicare Other | Admitting: Cardiology

## 2014-10-22 VITALS — BP 134/82 | HR 84 | Ht 65.0 in | Wt 144.0 lb

## 2014-10-22 DIAGNOSIS — I340 Nonrheumatic mitral (valve) insufficiency: Secondary | ICD-10-CM | POA: Diagnosis not present

## 2014-10-22 NOTE — Progress Notes (Signed)
HPI The patient presents for followup of mitral valve prolapse.   Since I last saw her she has done relatively okay. She still exercising routinely. She has no new shortness of breath. She denies any PND or orthopnea. She's had no palpitations, presyncope or syncope. She's not describing any chest discomfort. She does still have some bowel trouble. She had a cholecystectomy. Of note she was somewhat hypertensive since last visit and by phone I started her on Norvasc. Her blood pressures been well controlled.  Allergies  Allergen Reactions  . Typhoid Vaccines Rash  . Lobster [Shellfish Allergy] Nausea And Vomiting    Current Outpatient Prescriptions  Medication Sig Dispense Refill  . amLODipine (NORVASC) 2.5 MG tablet Take 1 tablet (2.5 mg total) by mouth daily. 30 tablet 6  . azithromycin (ZITHROMAX) 250 MG tablet Use as directed (Patient taking differently: Take 250-500 mg by mouth daily. She is to take two tablets daily for 1 day, then one tablet daily for 4 days. She started on 07/30/14 and has not completed.) 6 tablet 0  . cetirizine (ZYRTEC) 5 MG tablet Take 5 mg by mouth every morning.     . clobetasol ointment (TEMOVATE) 6.38 % Apply 1 application topically 2 (two) times daily as needed (For psoriasis.).   5  . Desoximetasone (TOPICORT) 0.25 % ointment Apply 1 application topically 2 (two) times daily as needed (For psoriasis.).   4  . glucosamine-chondroitin 500-400 MG tablet Take 2 tablets by mouth every morning.     Vladimir Faster Glycol-Propyl Glycol 0.4-0.3 % SOLN Place 2 drops into both eyes 4 (four) times daily as needed (For dry eyes.).    Marland Kitchen raloxifene (EVISTA) 60 MG tablet Take 60 mg by mouth every morning.     Marland Kitchen VAGIFEM 10 MCG TABS vaginal tablet Place 1 tablet vaginally daily.   4   No current facility-administered medications for this visit.    Past Medical History  Diagnosis Date  . Mitral valve prolapse     with moderate mitral regurgitation-LOV Dr. Percival Spanish 10-22-13    . Heart murmur   . Allergy     seasonal allergies, presently upper respiratory symptoms"runny nose, post nasal drip, rare cough_tx. Zpack  . GERD (gastroesophageal reflux disease)     controls with diet and rare OTC meds    Past Surgical History  Procedure Laterality Date  . Anterior cruciate ligament repair Left   . Minicus Right   . Tee without cardioversion  07/23/2011    Procedure: TRANSESOPHAGEAL ECHOCARDIOGRAM (TEE);  Surgeon: Loralie Champagne, MD;  Location: [facility] ENDOSCOPY;  Service: Cardiovascular;  Laterality: N/A;  . Breast biopsy      of benign lesions  . Tubal ligation    . Cholecystectomy N/A 08/02/2014    Procedure: LAPAROSCOPIC CHOLECYSTECTOMY WITH INTRAOPERATIVE CHOLANGIOGRAM;  Surgeon: Pedro Earls, MD;  Location: [facility] ORS;  Service: General;  Laterality: N/A;    ROS:  As stated in the HPI and negative for all other systems.  PHYSICAL EXAM BP 134/82 mmHg  Pulse 84  Ht 5\' 5"  (1.651 m)  Wt 144 lb (65.318 kg)  BMI 23.96 kg/m2 GENERAL:  Well appearing NECK:  No jugular venous distention, waveform within normal limits, carotid upstroke brisk and symmetric, no bruits, no thyromegaly LUNGS:  Clear to auscultation bilaterally CHEST:  Unremarkable HEART:  PMI not displaced or sustained,S1 and S2 within normal limits, no S3, no S4, positive mid systolic click with mild brief systolic murmur, no rubs. ABD:  Flat, positive  bowel sounds normal in frequency in pitch, no bruits, no rebound, no guarding, no midline pulsatile mass, no hepatomegaly, no splenomegaly EXT:  2 plus pulses throughout, no edema, no cyanosis no clubbing  EKG:  Sinus rhythm, rate 84, axis within leftward,  intervals within normal limits, no acute ST-T wave changes poor anterior R wave progression, axis shift is new since previous.  10/22/2014   ASSESSMENT AND PLAN  MITRAL REGURGITATION:  She has reported no new symptoms. However, she has had a change in her EKG. I will follow-up with another  echocardiogram.  PVCs:  She has no symptoms related to this. No further evaluation is indicated.  HTN:  Her blood pressure is controlled and she tolerates Norvasc. No change in therapy is indicated.

## 2014-10-22 NOTE — Patient Instructions (Signed)
Your physician recommends that you schedule a follow-up appointment in: one year with Dr. Percival Spanish  We are ordering an Echo for you to get done

## 2014-11-11 ENCOUNTER — Ambulatory Visit (HOSPITAL_COMMUNITY)
Admission: RE | Admit: 2014-11-11 | Discharge: 2014-11-11 | Disposition: A | Discharge status: Home / Self Care | Payer: Medicare Other | Source: Ambulatory Visit | Attending: Cardiology | Admitting: Cardiology

## 2014-11-11 DIAGNOSIS — I340 Nonrheumatic mitral (valve) insufficiency: Secondary | ICD-10-CM | POA: Diagnosis not present

## 2014-11-11 DIAGNOSIS — I351 Nonrheumatic aortic (valve) insufficiency: Secondary | ICD-10-CM | POA: Insufficient documentation

## 2014-11-11 DIAGNOSIS — I059 Rheumatic mitral valve disease, unspecified: Secondary | ICD-10-CM | POA: Diagnosis present

## 2014-11-12 ENCOUNTER — Encounter: Payer: Self-pay | Admitting: Cardiology

## 2014-11-14 ENCOUNTER — Telehealth: Payer: Self-pay | Admitting: Cardiology

## 2014-11-14 NOTE — Telephone Encounter (Signed)
Pt would like her echo results from Monday please.

## 2014-11-14 NOTE — Telephone Encounter (Signed)
Pt. Has been informed about her test

## 2014-11-18 ENCOUNTER — Telehealth: Payer: Self-pay | Admitting: Cardiology

## 2014-11-18 NOTE — Telephone Encounter (Signed)
Pt says she still have not received her echo results from 11-11-14. If not at home please [phone]

## 2014-11-18 NOTE — Telephone Encounter (Signed)
Results given to patient. Patient states she is unable to see report. RN informed patient will mail a copy to her. PLACED COPY IN MAIL.

## 2014-11-18 NOTE — Telephone Encounter (Signed)
Pt says she still have not received her echo results from 11-11-14 please.

## 2014-12-02 ENCOUNTER — Other Ambulatory Visit: Payer: Self-pay

## 2014-12-11 DIAGNOSIS — L408 Other psoriasis: Secondary | ICD-10-CM | POA: Diagnosis not present

## 2015-01-28 DIAGNOSIS — H04123 Dry eye syndrome of bilateral lacrimal glands: Secondary | ICD-10-CM | POA: Diagnosis not present

## 2015-01-28 DIAGNOSIS — H43393 Other vitreous opacities, bilateral: Secondary | ICD-10-CM | POA: Diagnosis not present

## 2015-01-28 DIAGNOSIS — H2513 Age-related nuclear cataract, bilateral: Secondary | ICD-10-CM | POA: Diagnosis not present

## 2015-02-03 DIAGNOSIS — R197 Diarrhea, unspecified: Secondary | ICD-10-CM | POA: Diagnosis not present

## 2015-02-03 DIAGNOSIS — K9189 Other postprocedural complications and disorders of digestive system: Secondary | ICD-10-CM | POA: Diagnosis not present

## 2015-02-06 IMAGING — US US ABDOMEN COMPLETE
1 series · 14 of 25 positions shown · non-contrast
Comparison: CT scan abdomen and pelvis 04/05/2014

CLINICAL DATA: Abdominal pain, constipation, bloating for 7 months

EXAM:
ULTRASOUND ABDOMEN COMPLETE

[Series 1: us abdomen complete · 0.22mm/px · 14 of 83 slices shown]
[im 1/83]
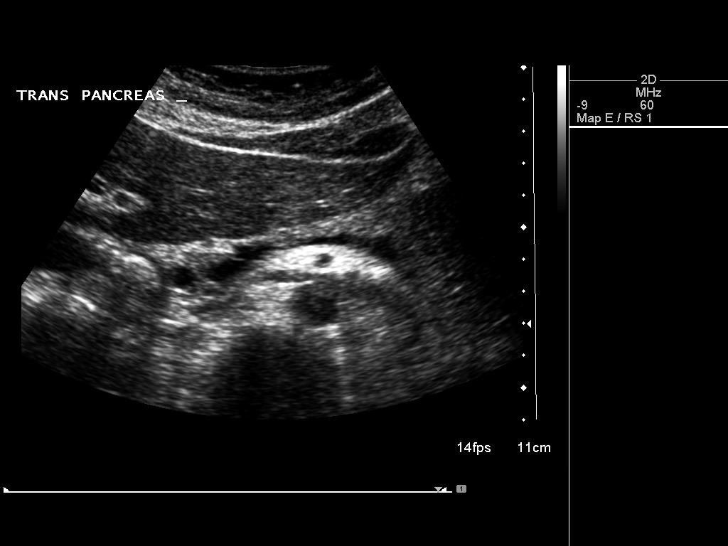
[im 7/83]
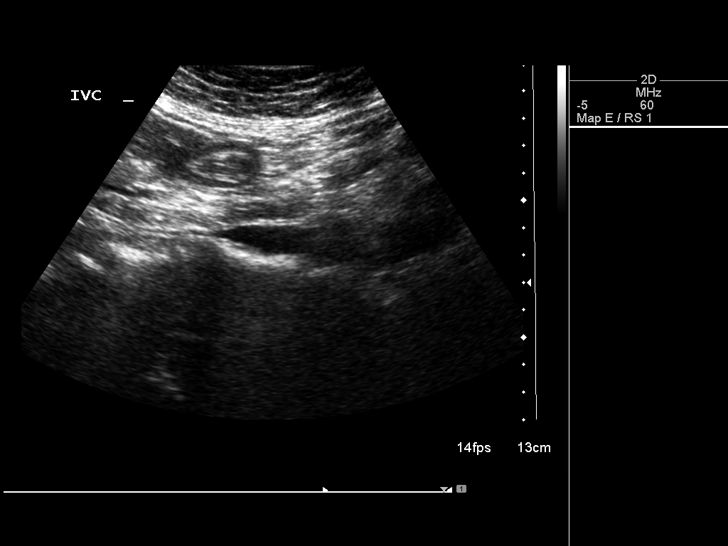
[im 14/83]
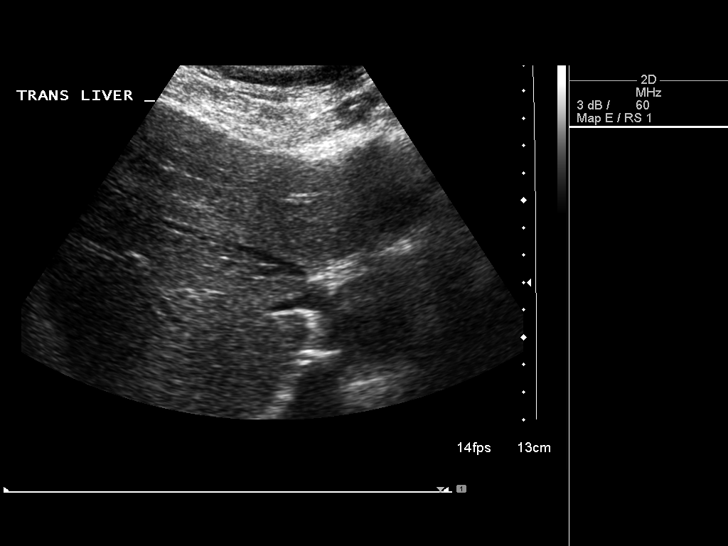
[im 21/83]
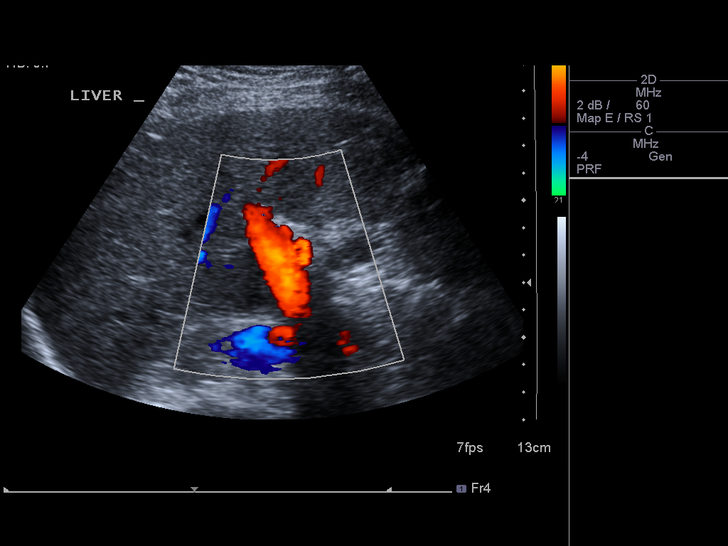
[im 28/83]
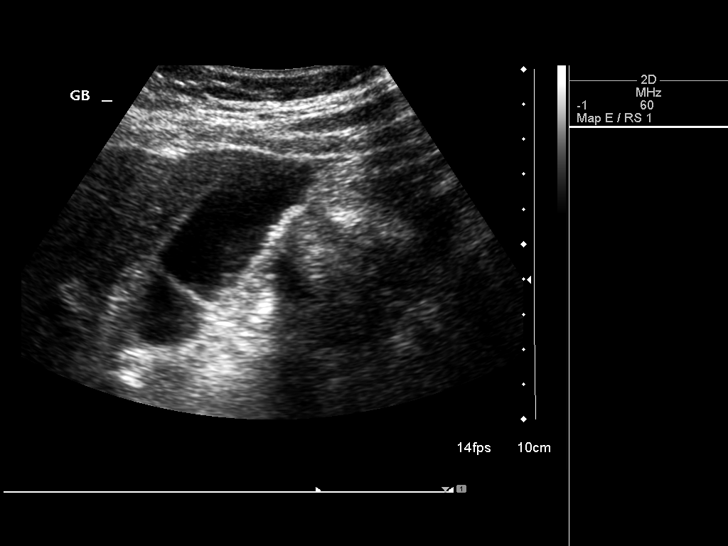
[im 31/83]
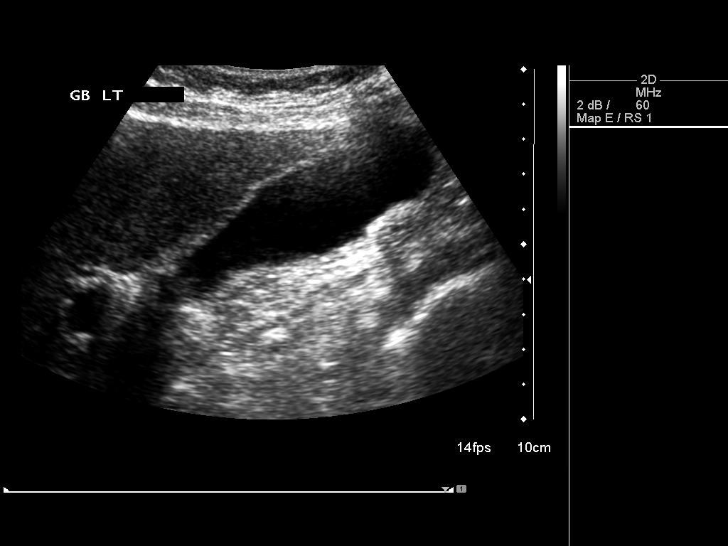
[im 38/83]
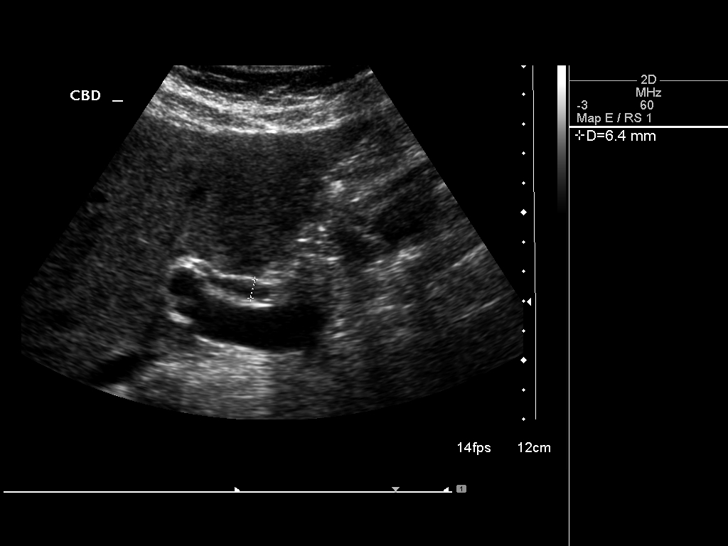
[im 45/83]
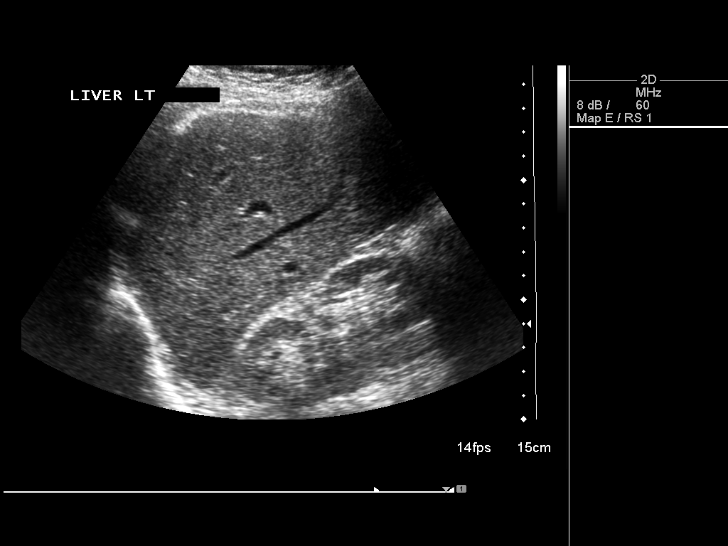
[im 52/83]
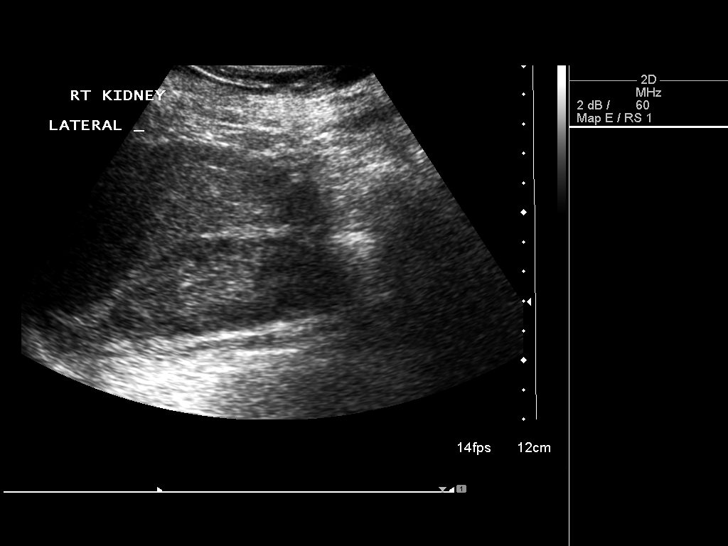
[im 55/83]
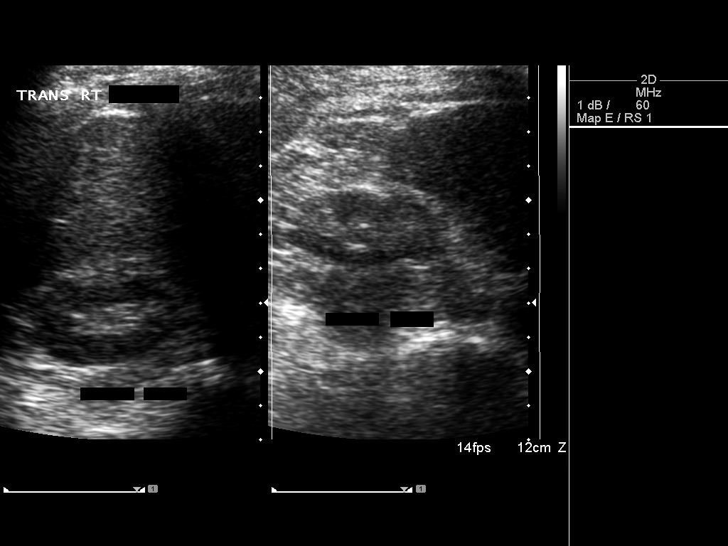
[im 62/83]
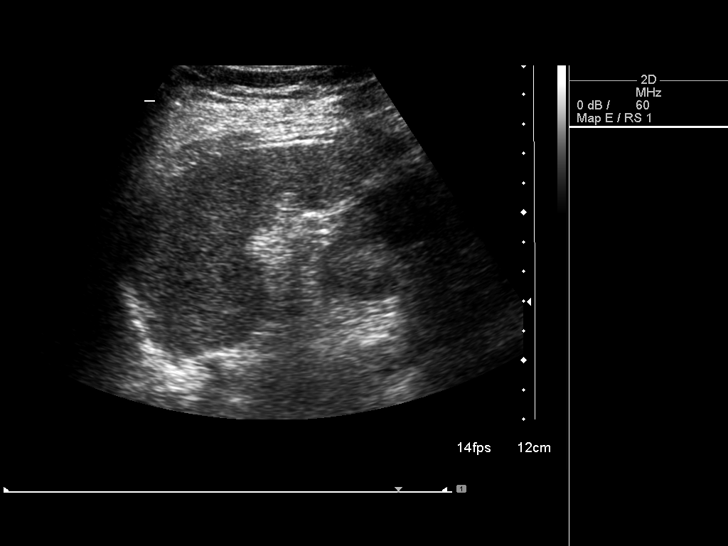
[im 69/83]
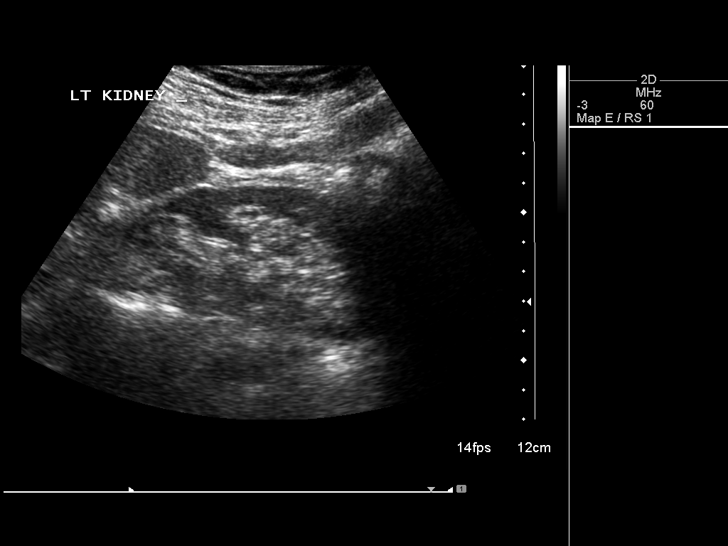
[im 76/83]
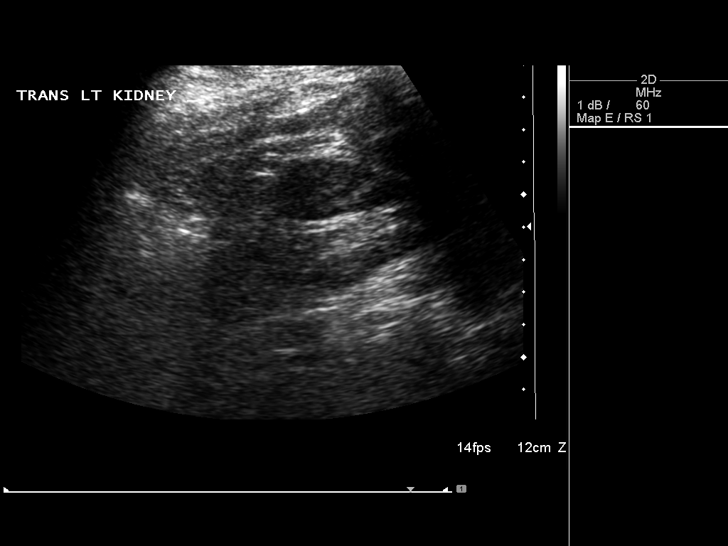
[im 83/83]
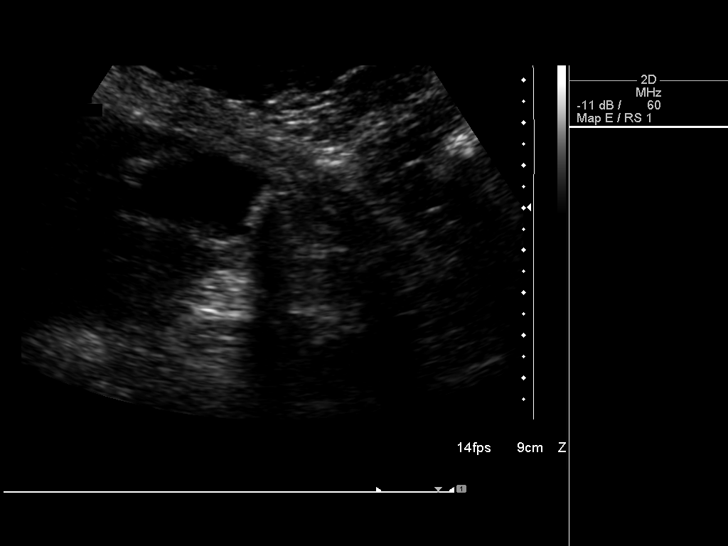

[14 of 25 positions shown; findings below may reference images not displayed]

FINDINGS: Gallbladder: Mobile small gallstones are noted within gallbladder
the largest measures 3 mm. No thickening of gallbladder wall. No
sonographic Murphy's sign.

Common bile duct: Diameter: 6.4 mm in diameter within normal limits
for age.

Liver: No focal lesion identified. Within normal limits in
parenchymal echogenicity.

IVC: No abnormality visualized.

Pancreas: Visualized portion unremarkable.

Spleen: Size and appearance within normal limits. Measures 7.3 cm in
length.

Right Kidney: Length: 10 cm. Echogenicity within normal limits. No
mass or hydronephrosis visualized.

Left Kidney: Length: 10 cm. Echogenicity within normal limits. No
mass or hydronephrosis visualized.

Abdominal aorta: No aneurysm visualized. Measures up to 1.7 cm in
diameter.

Other findings: None.
IMPRESSION: 1. Mobile small gallstones are noted within gallbladder the largest
measures 3 mm.
2. Normal CBD.  No sonographic Murphy's sign.
3. No hydronephrosis.
4. No aortic aneurysm.

## 2015-03-04 DIAGNOSIS — L409 Psoriasis, unspecified: Secondary | ICD-10-CM | POA: Diagnosis not present

## 2015-04-09 DIAGNOSIS — Z1231 Encounter for screening mammogram for malignant neoplasm of breast: Secondary | ICD-10-CM | POA: Diagnosis not present

## 2015-04-09 DIAGNOSIS — Z803 Family history of malignant neoplasm of breast: Secondary | ICD-10-CM | POA: Diagnosis not present

## 2015-04-14 ENCOUNTER — Other Ambulatory Visit: Payer: Self-pay | Admitting: Radiology

## 2015-04-14 DIAGNOSIS — Z1239 Encounter for other screening for malignant neoplasm of breast: Secondary | ICD-10-CM

## 2015-05-05 ENCOUNTER — Ambulatory Visit
Admission: RE | Admit: 2015-05-05 | Discharge: 2015-05-05 | Disposition: A | Discharge status: Home / Self Care | Payer: Medicare Other | Source: Ambulatory Visit | Attending: Radiology | Admitting: Radiology

## 2015-05-05 DIAGNOSIS — N6489 Other specified disorders of breast: Secondary | ICD-10-CM | POA: Diagnosis not present

## 2015-05-05 DIAGNOSIS — Z1239 Encounter for other screening for malignant neoplasm of breast: Secondary | ICD-10-CM

## 2015-05-05 MED ORDER — GADOBENATE DIMEGLUMINE 529 MG/ML IV SOLN
13.0000 mL | Freq: Once | INTRAVENOUS | Status: AC | PRN
  Administered 2015-05-05: 13 mL via INTRAVENOUS

## 2015-06-13 ENCOUNTER — Encounter: Payer: Self-pay | Admitting: Surgery

## 2015-06-13 NOTE — Progress Notes (Unsigned)
Patient ID: Carolyn Berger, female   DOB: 08/18/1945, 70 y.o.   MRN: [id number] Has an evolving "pimple" on the inner arm that is now getting red and getting streaks.  Doxycycline 100 mg BID ordered for up to 14 days.    Kaylyn Lim, MD, FACS

## 2015-06-17 DIAGNOSIS — Z79899 Other long term (current) drug therapy: Secondary | ICD-10-CM | POA: Diagnosis not present

## 2015-06-17 DIAGNOSIS — L400 Psoriasis vulgaris: Secondary | ICD-10-CM | POA: Diagnosis not present

## 2015-07-28 DIAGNOSIS — H43813 Vitreous degeneration, bilateral: Secondary | ICD-10-CM | POA: Diagnosis not present

## 2015-07-28 DIAGNOSIS — H2513 Age-related nuclear cataract, bilateral: Secondary | ICD-10-CM | POA: Diagnosis not present

## 2015-08-18 ENCOUNTER — Other Ambulatory Visit: Payer: Self-pay | Admitting: Cardiology

## 2015-08-18 DIAGNOSIS — H43813 Vitreous degeneration, bilateral: Secondary | ICD-10-CM | POA: Diagnosis not present

## 2015-08-18 DIAGNOSIS — H2513 Age-related nuclear cataract, bilateral: Secondary | ICD-10-CM | POA: Diagnosis not present

## 2015-08-18 NOTE — Telephone Encounter (Signed)
REFILL 

## 2015-08-28 ENCOUNTER — Ambulatory Visit (INDEPENDENT_AMBULATORY_CARE_PROVIDER_SITE_OTHER): Payer: Medicare Other | Admitting: Podiatry

## 2015-08-28 ENCOUNTER — Encounter: Payer: Self-pay | Admitting: Podiatry

## 2015-08-28 VITALS — BP 132/80 | HR 82 | Resp 16

## 2015-08-28 DIAGNOSIS — B351 Tinea unguium: Secondary | ICD-10-CM

## 2015-08-28 DIAGNOSIS — L609 Nail disorder, unspecified: Secondary | ICD-10-CM | POA: Diagnosis not present

## 2015-08-28 NOTE — Progress Notes (Signed)
   Subjective:    Patient ID: Carolyn Berger, female    DOB: 1945/06/18, 70 y.o.   MRN: [id number]  HPI Pt presents with bilateral thickened nails that are painful at times.   Review of Systems  All other systems reviewed and are negative.      Objective:   Physical Exam        Assessment & Plan:

## 2015-08-28 NOTE — Progress Notes (Signed)
Subjective:     Patient ID: Carolyn Berger, female   DOB: 1946/05/19, 70 y.o.   MRN: [id number]  HPI patient presents stating that she has had nail disease with lifting of the big toenail left over right which is occurred over the last few months. Does not remember a specific injury and they're occasionally painful   Review of Systems  All other systems reviewed and are negative.      Objective:   Physical Exam  Constitutional: She is oriented to person, place, and time.  Cardiovascular: Intact distal pulses.   Musculoskeletal: Normal range of motion.  Neurological: She is oriented to person, place, and time.  Skin: Skin is warm.  Nursing note and vitals reviewed.  neurovascular status found to be intact with muscle strength adequate and range of motion within normal limits. Patient's found to have good digital perfusion is well oriented 3 and is noted to have lifting of the distal two thirds of the nailbed left over right with tissue underneath it thickened in nature. Patient also has history of psoriasis which may be contributory to her problem. 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 Today we did a biopsy of the nailbed and we will see the results and decide in 4 weeks if there's any treatment that we will be able to undertake. If pain starts to become more prevalent may require nail removal

## 2015-08-29 ENCOUNTER — Telehealth: Payer: Self-pay | Admitting: *Deleted

## 2015-08-29 DIAGNOSIS — B351 Tinea unguium: Secondary | ICD-10-CM

## 2015-09-04 DIAGNOSIS — H2511 Age-related nuclear cataract, right eye: Secondary | ICD-10-CM | POA: Diagnosis not present

## 2015-09-11 DIAGNOSIS — H2512 Age-related nuclear cataract, left eye: Secondary | ICD-10-CM | POA: Diagnosis not present

## 2015-09-18 DIAGNOSIS — H2512 Age-related nuclear cataract, left eye: Secondary | ICD-10-CM | POA: Diagnosis not present

## 2015-09-23 NOTE — Telephone Encounter (Signed)
Entered in error

## 2015-09-25 ENCOUNTER — Encounter: Payer: Self-pay | Admitting: Podiatry

## 2015-09-25 ENCOUNTER — Ambulatory Visit (INDEPENDENT_AMBULATORY_CARE_PROVIDER_SITE_OTHER): Payer: Medicare Other | Admitting: Podiatry

## 2015-09-25 DIAGNOSIS — L600 Ingrowing nail: Secondary | ICD-10-CM | POA: Diagnosis not present

## 2015-09-25 DIAGNOSIS — B351 Tinea unguium: Secondary | ICD-10-CM

## 2015-09-28 NOTE — Progress Notes (Signed)
Subjective:     Patient ID: Carolyn Berger, female   DOB: [birth year], 70 y.o.   MRN: [id number]  HPI patient presents with discolored left hallux nail and states that she wants to see what can be done to treated   Review of Systems     Objective:   Physical Exam Neurovascular status intact muscle strength was adequate with patient noted to have discoloration of the distal two thirds of the hallux nail left with some looseness of the nailbed    Assessment:     Possible mycotic versus traumatic experience of the left hallux nail localized in nature with no extension to adjacent nails    Plan:     Reviewed pathology indicating this appears to be for the most part trauma and I do not recommend aggressive antifungal therapy even though there could be something fell a fungal element that is a portion of this problem. She will allowed to grow out and if it becomes loose painful or starts to drain we may need to take the nail off

## 2015-11-11 DIAGNOSIS — L400 Psoriasis vulgaris: Secondary | ICD-10-CM | POA: Diagnosis not present

## 2015-12-04 ENCOUNTER — Ambulatory Visit: Payer: Self-pay | Admitting: Cardiology

## 2015-12-04 NOTE — Progress Notes (Signed)
HPI The patient presents for followup of mitral valve prolapse.   Since I last saw her she has done well.  She has her fourth degree black belt.  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  The patient denies any new symptoms such as chest discomfort, neck or arm discomfort. There has been no new shortness of breath, PND or orthopnea. There have been no reported palpitations, presyncope or syncope.  She does a great deal of traveling and might get a little SOB walking up a hill at altitude.    Allergies  Allergen Reactions  . Typhoid Vaccines Rash  . Lobster [Shellfish Allergy] Nausea And Vomiting    Current Outpatient Prescriptions  Medication Sig Dispense Refill  . amLODipine (NORVASC) 2.5 MG tablet TAKE 1 TABLET BY MOUTH EVERY DAY 30 tablet 4  . cetirizine (ZYRTEC) 5 MG tablet Take 5 mg by mouth every morning.     . clobetasol ointment (TEMOVATE) [id number] % Apply 1 application topically 2 (two) times daily as needed (For psoriasis.).   5  . Desoximetasone (TOPICORT) 0.25 % ointment Apply 1 application topically 2 (two) times daily as needed (For psoriasis.).   4  . dicyclomine (BENTYL) 20 MG tablet Take 20 mg by mouth every 6 (six) hours.    Marland Kitchen esomeprazole (NEXIUM) 20 MG capsule Take 20 mg by mouth daily at 12 noon.    Marland Kitchen glucosamine-chondroitin 500-400 MG tablet Take 2 tablets by mouth every morning. Reported on 08/28/2015    . Polyethyl Glycol-Propyl Glycol 0.4-0.3 % SOLN Place 2 drops into both eyes 4 (four) times daily as needed (For dry eyes.). Reported on 08/28/2015    . raloxifene (EVISTA) 60 MG tablet Take 60 mg by mouth every morning.     Marland Kitchen VAGIFEM 10 MCG TABS vaginal tablet Place 1 tablet vaginally daily.   4   No current facility-administered medications for this visit.    Past Medical History  Diagnosis Date  . Mitral valve prolapse     with moderate mitral regurgitation-LOV Dr. Percival Spanish 10-22-13  . Heart murmur   . Allergy     seasonal allergies, presently upper respiratory symptoms"runny  nose, post nasal drip, rare cough_tx. Zpack  . GERD (gastroesophageal reflux disease)     controls with diet and rare OTC meds    Past Surgical History  Procedure Laterality Date  . Anterior cruciate ligament repair Left   . Minicus Right   . Tee without cardioversion  07/23/2011    Procedure: TRANSESOPHAGEAL ECHOCARDIOGRAM (TEE);  Surgeon: Loralie Champagne, MD;  Location: [facility] ENDOSCOPY;  Service: Cardiovascular;  Laterality: N/A;  . Breast biopsy      of benign lesions  . Tubal ligation    . Cholecystectomy N/A 08/02/2014    Procedure: LAPAROSCOPIC CHOLECYSTECTOMY WITH INTRAOPERATIVE CHOLANGIOGRAM;  Surgeon: Pedro Earls, MD;  Location: [facility] ORS;  Service: General;  Laterality: N/A;    ROS:  As stated in the HPI and negative for all other systems.  PHYSICAL EXAM BP 134/82 mmHg  Pulse 70  Ht 5\' 5"  (1.651 m)  Wt 138 lb (62.596 kg)  BMI 22.96 kg/m2 GENERAL:  Well appearing NECK:  No jugular venous distention, waveform within normal limits, carotid upstroke brisk and symmetric, no bruits, no thyromegaly LUNGS:  Clear to auscultation bilaterally CHEST:  Unremarkable HEART:  PMI not displaced or sustained,S1 and S2 within normal limits, no S3, no S4, positive mid systolic click with mild brief systolic murmur, no rubs. ABD:  Flat, positive bowel sounds normal  in frequency in pitch, no bruits, no rebound, no guarding, no midline pulsatile mass, no hepatomegaly, no splenomegaly EXT:  2 plus pulses throughout, no edema, no cyanosis no clubbing  EKG:  Sinus rhythm, rate 70, axis within leftward,  intervals within normal limits, no acute ST-T wave changes poor anterior R wave progression.  No change since previous. .  12/05/2015   ASSESSMENT AND PLAN  MITRAL REGURGITATION:  I will follow with another echo this year. She has no change in her exam or her symptoms.    PVCs:  She has no symptoms related to these  HTN:  Her blood pressure is controlled and she tolerates Norvasc. No change in  therapy is indicated.

## 2015-12-05 ENCOUNTER — Encounter: Payer: Self-pay | Admitting: Cardiology

## 2015-12-05 ENCOUNTER — Ambulatory Visit (INDEPENDENT_AMBULATORY_CARE_PROVIDER_SITE_OTHER): Payer: Medicare Other | Admitting: Cardiology

## 2015-12-05 VITALS — BP 134/82 | HR 70 | Ht 65.0 in | Wt 138.0 lb

## 2015-12-05 DIAGNOSIS — I080 Rheumatic disorders of both mitral and aortic valves: Secondary | ICD-10-CM

## 2015-12-05 NOTE — Patient Instructions (Signed)
Medication Instructions:  Continue current medications  Labwork: NONE  Testing/Procedures: Your physician has requested that you have an echocardiogram. Echocardiography is a painless test that uses sound waves to create images of your heart. It provides your doctor with information about the size and shape of your heart and how well your heart's chambers and valves are working. This procedure takes approximately one hour. There are no restrictions for this procedure.  Follow-Up: Your physician wants you to follow-up in: 1 Year. You will receive a reminder letter in the mail two months in advance. If you don't receive a letter, please call our office to schedule the follow-up appointment.   Any Other Special Instructions Will Be Listed Below (If Applicable).   If you need a refill on your cardiac medications before your next appointment, please call your pharmacy.   

## 2016-01-01 ENCOUNTER — Other Ambulatory Visit: Payer: Self-pay | Admitting: Cardiology

## 2016-01-01 NOTE — Telephone Encounter (Signed)
REFILL 

## 2016-01-06 ENCOUNTER — Other Ambulatory Visit (HOSPITAL_COMMUNITY): Payer: Self-pay

## 2016-01-06 ENCOUNTER — Ambulatory Visit (HOSPITAL_COMMUNITY): Payer: Medicare Other | Attending: Cardiovascular Disease

## 2016-01-06 DIAGNOSIS — I351 Nonrheumatic aortic (valve) insufficiency: Secondary | ICD-10-CM | POA: Insufficient documentation

## 2016-01-06 DIAGNOSIS — I080 Rheumatic disorders of both mitral and aortic valves: Secondary | ICD-10-CM | POA: Diagnosis not present

## 2016-01-06 DIAGNOSIS — I340 Nonrheumatic mitral (valve) insufficiency: Secondary | ICD-10-CM | POA: Insufficient documentation

## 2016-01-06 DIAGNOSIS — I517 Cardiomegaly: Secondary | ICD-10-CM | POA: Diagnosis not present

## 2016-01-09 ENCOUNTER — Telehealth: Payer: Self-pay | Admitting: Cardiology

## 2016-01-09 NOTE — Telephone Encounter (Signed)
Patient called regarding echo results. She was concerned that the report indicated aortic regurg. Explained MD's note and that it was essentially unchanged. She voiced understanding.

## 2016-01-09 NOTE — Telephone Encounter (Signed)
New message       Pt has a question about her echo results that is posted on mychart.  Please call

## 2016-02-10 ENCOUNTER — Other Ambulatory Visit: Payer: Self-pay

## 2016-03-11 DIAGNOSIS — Z23 Encounter for immunization: Secondary | ICD-10-CM | POA: Diagnosis not present

## 2016-03-25 DIAGNOSIS — K529 Noninfective gastroenteritis and colitis, unspecified: Secondary | ICD-10-CM | POA: Diagnosis not present

## 2016-03-26 DIAGNOSIS — K529 Noninfective gastroenteritis and colitis, unspecified: Secondary | ICD-10-CM | POA: Diagnosis not present

## 2016-04-10 ENCOUNTER — Encounter (HOSPITAL_COMMUNITY): Payer: Self-pay | Admitting: Emergency Medicine

## 2016-04-10 ENCOUNTER — Ambulatory Visit (INDEPENDENT_AMBULATORY_CARE_PROVIDER_SITE_OTHER): Payer: Medicare Other

## 2016-04-10 ENCOUNTER — Ambulatory Visit (HOSPITAL_COMMUNITY)
Admission: EM | Admit: 2016-04-10 | Discharge: 2016-04-10 | Disposition: A | Discharge status: Home / Self Care | Payer: Medicare Other | Attending: Emergency Medicine | Admitting: Emergency Medicine

## 2016-04-10 DIAGNOSIS — M5412 Radiculopathy, cervical region: Secondary | ICD-10-CM

## 2016-04-10 DIAGNOSIS — M542 Cervicalgia: Secondary | ICD-10-CM | POA: Diagnosis not present

## 2016-04-10 MED ORDER — GABAPENTIN 300 MG PO CAPS: 300.0000 mg | ORAL_CAPSULE | Freq: Every day | ORAL | 0 refills | Status: DC

## 2016-04-10 MED ORDER — PREDNISONE 50 MG PO TABS: ORAL_TABLET | ORAL | 0 refills | Status: DC

## 2016-04-10 NOTE — Discharge Instructions (Signed)
It looks like this is coming from a pinched nerve. Take prednisone daily for 5 days. This will help get rid of swelling and inflammation. Use the gabapentin at bedtime to help with pain. This will make you drowsy. I put in a referral to physical therapy. If they have not contacted you by Wednesday, please call us at [phone]. It is okay to continue ice or heat, whichever feels better. Be careful mixing Advil with prednisone as this can upset the stomach.

## 2016-04-10 NOTE — ED Provider Notes (Signed)
[place]    CSN: [id number] Arrival date & time: 04/10/16  1505     History   Chief Complaint Chief Complaint  Patient presents with  . Neck Pain    HPI Carolyn Berger is a 70 y.o. female.   HPI  She is a 70 year old woman here for evaluation of neck pain. She is a very active woman, participating in 12 hours of karate a week. 2 weeks ago, she developed pain in the right neck and shoulder. Initially, this responded well to occasional Advil. No injury or trauma. No change in activity. 3 days ago, the pain got acutely worse. She was unable to move her head without significant pain. At that time, she felt some weakness in the right arm and had pain radiating down the back of her arm to the elbow. She started taking 600 mg of ibuprofen 3 times a day and symptoms have improved. Now she describes it as more of a soreness in her neck and shoulder. They've also been doing some heat and ice.  Past Medical History:  Diagnosis Date  . Allergy    seasonal allergies, presently upper respiratory symptoms"runny nose, post nasal drip, rare cough_tx. Zpack  . Cataract   . GERD (gastroesophageal reflux disease)    controls with diet and rare OTC meds  . Heart murmur   . Mitral valve prolapse    with moderate mitral regurgitation-LOV Dr. Percival Spanish 10-22-13    Patient Active Problem List   Diagnosis Date Noted  . Genetic testing 10/09/2014  . History of laparoscopic cholecystectomy 08/02/2014  . Psoriasis 10/02/2012  . Dyspnea 07/01/2011  . HYPERLIPIDEMIA 12/23/2008  . MITRAL REGURGITATION 12/20/2008  . Mitral valve disorder 12/20/2008    Past Surgical History:  Procedure Laterality Date  . ANTERIOR CRUCIATE LIGAMENT REPAIR Left   . BREAST BIOPSY     of benign lesions  . CHOLECYSTECTOMY N/A 08/02/2014   Procedure: LAPAROSCOPIC CHOLECYSTECTOMY WITH INTRAOPERATIVE CHOLANGIOGRAM;  Surgeon: Pedro Earls, MD;  Location: [facility];  Service: General;  Laterality: N/A;  .  minicus Right   . TEE WITHOUT CARDIOVERSION  07/23/2011   Procedure: TRANSESOPHAGEAL ECHOCARDIOGRAM (TEE);  Surgeon: Loralie Champagne, MD;  Location: [place];  Service: Cardiovascular;  Laterality: N/A;  . TUBAL LIGATION      OB History    No data available       Home Medications    Prior to Admission medications   Medication Sig Start Date End Date Taking? Authorizing Provider  Apremilast (OTEZLA PO) Take by mouth.   Yes Historical Provider, MD  amLODipine (NORVASC) 2.5 MG tablet TAKE 1 TABLET BY MOUTH EVERY DAY 01/01/16   Minus Breeding, MD  cetirizine (ZYRTEC) 5 MG tablet Take 5 mg by mouth every morning.     Historical Provider, MD  clobetasol ointment (TEMOVATE) [id number] % Apply 1 application topically 2 (two) times daily as needed (For psoriasis.).  05/07/14   Historical Provider, MD  Desoximetasone (TOPICORT) 0.25 % ointment Apply 1 application topically 2 (two) times daily as needed (For psoriasis.).  05/08/14   Historical Provider, MD  dicyclomine (BENTYL) 20 MG tablet Take 20 mg by mouth every 6 (six) hours.    Historical Provider, MD  esomeprazole (NEXIUM) 20 MG capsule Take 20 mg by mouth daily at 12 noon.    Historical Provider, MD  gabapentin (NEURONTIN) 300 MG capsule Take 1 capsule (300 mg total) by mouth at bedtime. 04/10/16   Melony Overly, MD  glucosamine-chondroitin 500-400 MG tablet  Take 2 tablets by mouth every morning. Reported on 08/28/2015    Historical Provider, MD  Polyethyl Glycol-Propyl Glycol 0.4-0.3 % SOLN Place 2 drops into both eyes 4 (four) times daily as needed (For dry eyes.). Reported on 08/28/2015    Historical Provider, MD  predniSONE (DELTASONE) 50 MG tablet Take 1 pill daily for 5 days. 04/10/16   Melony Overly, MD  raloxifene (EVISTA) 60 MG tablet Take 60 mg by mouth every morning.     Historical Provider, MD  VAGIFEM 10 MCG TABS vaginal tablet Place 1 tablet vaginally daily.  06/27/14   Historical Provider, MD    Family History Family History  Problem  Relation Age of Onset  . Cancer Mother     breast  . Cancer Father     lymphoma  . Cancer Sister     uterine  . Cancer Sister     Breast CA    Social History Social History  Substance Use Topics  . Smoking status: Never Smoker  . Smokeless tobacco: Not on file  . Alcohol use Yes     Comment: daily glass of wine     Allergies   Typhoid vaccines and Lobster [shellfish allergy]   Review of Systems Review of Systems As in history of present illness  Physical Exam Triage Vital Signs ED Triage Vitals  Enc Vitals Group     BP 04/10/16 1652 150/65     Pulse Rate 04/10/16 1652 74     Resp 04/10/16 1652 18     Temp 04/10/16 1652 98.1 F (36.7 C)     Temp Source 04/10/16 1652 Oral     SpO2 04/10/16 1652 99 %     Weight --      Height --      Head Circumference --      Peak Flow --      Pain Score 04/10/16 1651 5     Pain Loc --      Pain Edu? --      Excl. in [place]? --    No data found.   Updated Vital Signs BP 150/65 (BP Location: Left Arm)   Pulse 74   Temp 98.1 F (36.7 C) (Oral)   Resp 18   SpO2 99%   Visual Acuity Right Eye Distance:   Left Eye Distance:   Bilateral Distance:    Right Eye Near:   Left Eye Near:    Bilateral Near:     Physical Exam  Constitutional: She is oriented to person, place, and time. She appears well-developed and well-nourished. No distress.  Neck:  Holding head stiffly. Range of motion limited due to pain. She is tender to palpation along the trapezius and right neck. 5 out of 5 strength in bilateral upper extremities. Sensation grossly intact. Positive Spurling's.  Cardiovascular: Normal rate.   Pulmonary/Chest: Effort normal.  Neurological: She is alert and oriented to person, place, and time.     UC Treatments / Results  Labs (all labs ordered are listed, but only abnormal results are displayed) Labs Reviewed - No data to display  EKG  EKG Interpretation None       Radiology Dg Cervical Spine  Complete  Result Date: 04/10/2016 CLINICAL DATA:  Neck pain with radiculopathy for 2 weeks. No reported injury. EXAM: CERVICAL SPINE - COMPLETE 4+ VIEW COMPARISON:  None. FINDINGS: On the lateral view the cervical spine is visualized to the level of C7-T1. There is a normal cervical lordosis. Pre-vertebral soft tissues  are within normal limits. No fracture is detected in the cervical spine. Dens is well positioned between the lateral masses of C1. Mild loss of disc height at C4-5 and C7-T1 without significant spondylotic change. No cervical spine subluxation. Bilateral facet arthropathy in the mid to lower cervical spine, asymmetric to the right. No significant degenerative foraminal stenosis on the left. Probable moderate degenerative foraminal stenosis on the right at C3-4 and C4-5 predominantly due to facet arthropathy, suboptimally evaluated due to near lateral positioning of the right oblique view. No aggressive-appearing focal osseous lesions. IMPRESSION: 1. No cervical spine fracture or subluxation . 2. 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 3. Degenerative foraminal stenosis on the right at C3-4 and C4-5 predominantly due to facet arthropathy. Electronically Signed   By: Ilona Sorrel M.D.   On: 04/10/2016 18:23    Procedures Procedures (including critical care time)  Medications Ordered in UC Medications - No data to display   Initial Impression / Assessment and Plan / UC Course  I have reviewed the triage vital signs and the nursing notes.  Pertinent labs & imaging results that were available during my care of the patient were reviewed by me and considered in my medical decision making (see chart for details).  Clinical Course    We'll treat with prednisone and gabapentin. Order placed for physical therapy referral. Follow-up as needed.  Final Clinical Impressions(s) / UC Diagnoses   Final diagnoses:  Cervical radiculopathy    New Prescriptions New  Prescriptions   GABAPENTIN (NEURONTIN) 300 MG CAPSULE    Take 1 capsule (300 mg total) by mouth at bedtime.   PREDNISONE (DELTASONE) 50 MG TABLET    Take 1 pill daily for 5 days.     Melony Overly, MD 04/10/16 [phone]

## 2016-04-10 NOTE — ED Triage Notes (Signed)
Neck pain a couple a weeks ago.  Initially otc meds helped.  Over the weekend neck pain worsened.  Patient was out of town.  Pain is reducing, but continues to hurt and concerned for source.    Patient does participate in karate

## 2016-04-15 ENCOUNTER — Ambulatory Visit: Payer: Medicare Other | Attending: Emergency Medicine | Admitting: Physical Therapy

## 2016-04-15 ENCOUNTER — Encounter: Payer: Self-pay | Admitting: Physical Therapy

## 2016-04-15 DIAGNOSIS — M542 Cervicalgia: Secondary | ICD-10-CM | POA: Insufficient documentation

## 2016-04-15 DIAGNOSIS — R279 Unspecified lack of coordination: Secondary | ICD-10-CM | POA: Insufficient documentation

## 2016-04-15 DIAGNOSIS — M62838 Other muscle spasm: Secondary | ICD-10-CM | POA: Insufficient documentation

## 2016-04-15 NOTE — Therapy (Signed)
[facility] [address] [place], [place], [address] Phone: [phone]   Fax:  [phone]  Physical Therapy Evaluation  Patient Details  Name: Carolyn Berger MRN: [id number] Date of Birth: 02-10-1946 Referring Provider: Dr. Hoyt Koch  Encounter Date: 04/15/2016      PT End of Session - 04/15/16 0944    Visit Number 1   Number of Visits 10   Date for PT Re-Evaluation 06/10/16   Authorization Type KX modifier needed after visit 15   PT Start Time 0851   PT Stop Time 0938   PT Time Calculation (min) 47 min   Activity Tolerance Patient tolerated treatment well   Behavior During Therapy [facility] for tasks assessed/performed      Past Medical History:  Diagnosis Date  . Allergy    seasonal allergies, presently upper respiratory symptoms"runny nose, post nasal drip, rare cough_tx. Zpack  . Cataract   . GERD (gastroesophageal reflux disease)    controls with diet and rare OTC meds  . Heart murmur   . Mitral valve prolapse    with moderate mitral regurgitation-LOV Dr. Percival Spanish 10-22-13    Past Surgical History:  Procedure Laterality Date  . ANTERIOR CRUCIATE LIGAMENT REPAIR Left   . BREAST BIOPSY     of benign lesions  . CHOLECYSTECTOMY N/A 08/02/2014   Procedure: LAPAROSCOPIC CHOLECYSTECTOMY WITH INTRAOPERATIVE CHOLANGIOGRAM;  Surgeon: Pedro Earls, MD;  Location: [facility];  Service: General;  Laterality: N/A;  . minicus Right   . TEE WITHOUT CARDIOVERSION  07/23/2011   Procedure: TRANSESOPHAGEAL ECHOCARDIOGRAM (TEE);  Surgeon: Loralie Champagne, MD;  Location: [place];  Service: Cardiovascular;  Laterality: N/A;  . TUBAL LIGATION      There were no vitals filed for this visit.       Subjective Assessment - 04/15/16 0856    Subjective Noticed neck and shoulder pain 2-3 weeks ago. Started getting worse until last Wednesday when she couldn't move and went to urgent care.  She was placed on prednisone and  just finished taking prednisone.  She is active and does karate about 12 hours per week.  She has not been doing karate and has been avoiding most UE movements since pain became bad about a week.  Pt reports recent x-ray show narrowing of C3/4 and C4/5 foramen.    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 >60 minutes   Diagnostic tests x-ray, shows narrowing c4/5   Patient Stated Goals be able to go back to karate   Currently in Pain? Yes   Pain Score 4   now 4/10 after prednisone, was 10/10 last week   Pain Location Neck   Pain Orientation Right  runs down back of arm   Pain Descriptors / Indicators Stabbing;Sharp;Shooting;Radiating  excruciating like something shot me   Pain Type Acute pain   Pain Radiating Towards R shoulder/arm   Pain Onset 1 to 4 weeks ago   Aggravating Factors  being on computer and acitvities involving arm movement   Pain Relieving Factors prednisone and resting, NSAIDS, ice, avoiding activities   Effect of Pain on Daily Activities karate, cleaning, chores   Multiple Pain Sites No            [facility] PT Assessment - 04/15/16 0001      Assessment   Medical Diagnosis M54.12 right cervical radiculopathy  Referring Provider Dr. Hoyt Koch   Hand Dominance Right   Prior Therapy None     Precautions   Precautions None     Restrictions   Weight Bearing Restrictions No     Balance Screen   Has the patient fallen in the past 6 months No   Has the patient had a decrease in activity level because of a fear of falling?  No   Is the patient reluctant to leave their home because of a fear of falling?  No     Home Ecologist residence     Prior Function   Level of Independence Independent     Cognition   Overall Cognitive Status Within Functional Limits for tasks assessed     Observation/Other Assessments    Focus on Therapeutic Outcomes (FOTO)  51% limitation CK  goal is 35% limitation CJ     Posture/Postural Control   Posture/Postural Control Postural limitations   Postural Limitations Rounded Shoulders;Forward head;Increased thoracic kyphosis   Posture Comments increased Upper Trap activity with UE movements     ROM / Strength   AROM / PROM / Strength AROM;Strength     AROM   Overall AROM  Within functional limits for tasks performed  for bilateral shoulders   Overall AROM Comments patient uses her upper traps with shoulder motion   AROM Assessment Site Cervical   Cervical Flexion 36   Cervical Extension 70   Cervical - Right Side Bend 10  pain on right   Cervical - Left Side Bend 10   Cervical - Right Rotation 55  pain on right   Cervical - Left Rotation 68  pain on right     Strength   Overall Strength Within functional limits for tasks performed     Palpation   Spinal mobility c3-4, C4-5 hypomobile   Palpation comment tightness located in right scalene, rhomboid,  upper trap;      Special Tests    Special Tests Cervical   Cervical Tests Dictraction     Distraction Test   Findngs Negative   side Right   Comment Patient just finished prednisone and not having pain currently     Ambulation/Gait   Gait Pattern Within Functional Limits                   [facility] Adult PT Treatment/Exercise - 04/15/16 0001      Manual Therapy   Manual Therapy Myofascial release;Soft tissue mobilization   Manual therapy comments STM to suboccipitals Rt side, cervical paraspinals bilateral; MFR cervical paraspinals                  PT Short Term Goals - 04/15/16 0946      PT SHORT TERM GOAL #1   Title Pt will be independent with initial HEP   Time 4   Period Weeks   Status New     PT SHORT TERM GOAL #2   Title Pt will be able to turn head for looking over her shoulder when driving wihtout increased pain    Time 4   Period Weeks   Status New     PT SHORT  TERM GOAL #3   Title Pt will be able to perform functional squat without hyperactivity of UT muscles in order to do warm ups in karate classes.   Time 4   Period Weeks   Status New           PT  Long Term Goals - 04/15/16 0959      PT LONG TERM GOAL #1   Title Pt will be independent with advanced HEP   Time 8   Period Weeks   Status New     PT LONG TERM GOAL #2   Title FOTO < or += to 35%   Time 8   Period Weeks   Status New     PT LONG TERM GOAL #3   Title Pt will demonstrate improved posture with greater shoulder depression and be able to sustain it during reaching forward and overhead activities   Time 8   Period Weeks   Status New     PT LONG TERM GOAL #4   Title Pt will be able to return to karate class without increased neck or shoulder pain.   Time 8   Period Weeks   Status New               Plan - 04/15/16 1006    Clinical Impression Statement Pt seen for low complex eval due to no co-morbidities effecting outcomes and only one body region to be addressed.  Pt presents with multiple muscle spasms around neck and shoulder, hypomobile C3/4/5 joints, decreased AROM of cervical rotation and sidebending, hyperactive upper traps and weakness of mid-low traps with overpowering of upper traps and suboccipitals during functional movements.  Pt has pain ranging from 10/10 to 4/10 in Rt side of neck radiating down Rt shoulder/arm.  Pt is active and skilled PT needed to return to her active and healthy lifestyle. Pt will benefit from skilled PT to retrain muscles for improved functional movements in order to reduce stress on cervical vertebrae and to help alleviate nerve pain in this region.   Rehab Potential Excellent   Clinical Impairments Affecting Rehab Potential none   PT Frequency 2x / week   PT Duration 8 weeks   PT Treatment/Interventions Cryotherapy;Moist Heat;Electrical Stimulation;Functional mobility training;Therapeutic activities;Therapeutic  exercise;Neuromuscular re-education;Patient/family education;Manual techniques;Passive range of motion   PT Next Visit Plan posture re-ed, shoulder and core strengthening, functional movement, squats, manual STM/MFR to cervical muscles, initiate HEP   PT Home Exercise Plan initiate at next visit   Recommended Other Services none   Consulted and Agree with Plan of Care Patient      Patient will benefit from skilled therapeutic intervention in order to improve the following deficits and impairments:  Decreased coordination, Decreased strength, Postural dysfunction, Pain, Hypomobility, Decreased range of motion  Visit Diagnosis: Cervicalgia - Plan: PT plan of care cert/re-cert  Other muscle spasm - Plan: PT plan of care cert/re-cert  Unspecified lack of coordination - Plan: PT plan of care cert/re-cert      G-Codes - [id number]    Functional Assessment Tool Used FOTO score is 51% limitation  goal is 35% limitation   Functional Limitation Carrying, moving and handling objects   Carrying, Moving and Handling Objects Current Status [id number]) At least 40 percent but less than 60 percent impaired, limited or restricted   Carrying, Moving and Handling Objects Goal Status [id number]) At least 20 percent but less than 40 percent impaired, limited or restricted       Problem List Patient Active Problem List   Diagnosis Date Noted  . Genetic testing 10/09/2014  . History of laparoscopic cholecystectomy 08/02/2014  . Psoriasis 10/02/2012  . Dyspnea 07/01/2011  . HYPERLIPIDEMIA 12/23/2008  . MITRAL REGURGITATION 12/20/2008  . Mitral valve disorder 12/20/2008   Zannie Cove, PT 04/15/16 12:49 PM  GRAY,CHERYL, PT 04/15/2016, 11:46 AM  [facility] [address] [place], [place], [address] Phone: [phone]   Fax:  [phone]  Name: Saija Plunket MRN: [id number] Date of Birth: Nov 01, 1945

## 2016-04-16 DIAGNOSIS — Z1231 Encounter for screening mammogram for malignant neoplasm of breast: Secondary | ICD-10-CM | POA: Diagnosis not present

## 2016-04-16 DIAGNOSIS — R921 Mammographic calcification found on diagnostic imaging of breast: Secondary | ICD-10-CM | POA: Diagnosis not present

## 2016-04-16 DIAGNOSIS — Z803 Family history of malignant neoplasm of breast: Secondary | ICD-10-CM | POA: Diagnosis not present

## 2016-04-19 ENCOUNTER — Encounter: Payer: Self-pay | Admitting: Physical Therapy

## 2016-04-19 ENCOUNTER — Other Ambulatory Visit: Payer: Self-pay | Admitting: Radiology

## 2016-04-19 DIAGNOSIS — N6011 Diffuse cystic mastopathy of right breast: Secondary | ICD-10-CM | POA: Diagnosis not present

## 2016-04-19 DIAGNOSIS — Z0000 Encounter for general adult medical examination without abnormal findings: Secondary | ICD-10-CM | POA: Diagnosis not present

## 2016-04-19 DIAGNOSIS — R921 Mammographic calcification found on diagnostic imaging of breast: Secondary | ICD-10-CM | POA: Diagnosis not present

## 2016-04-21 ENCOUNTER — Encounter: Payer: Self-pay | Admitting: Physical Therapy

## 2016-04-21 ENCOUNTER — Ambulatory Visit: Payer: Medicare Other | Admitting: Physical Therapy

## 2016-04-21 DIAGNOSIS — R279 Unspecified lack of coordination: Secondary | ICD-10-CM | POA: Diagnosis not present

## 2016-04-21 DIAGNOSIS — M62838 Other muscle spasm: Secondary | ICD-10-CM | POA: Diagnosis not present

## 2016-04-21 DIAGNOSIS — K219 Gastro-esophageal reflux disease without esophagitis: Secondary | ICD-10-CM | POA: Diagnosis not present

## 2016-04-21 DIAGNOSIS — K580 Irritable bowel syndrome with diarrhea: Secondary | ICD-10-CM | POA: Diagnosis not present

## 2016-04-21 DIAGNOSIS — R197 Diarrhea, unspecified: Secondary | ICD-10-CM | POA: Diagnosis not present

## 2016-04-21 DIAGNOSIS — L409 Psoriasis, unspecified: Secondary | ICD-10-CM | POA: Diagnosis not present

## 2016-04-21 DIAGNOSIS — M542 Cervicalgia: Secondary | ICD-10-CM

## 2016-04-21 NOTE — Therapy (Signed)
[facility] [address] [place], [place], [address] Phone: [phone]   Fax:  [phone]  Physical Therapy Treatment  Patient Details  Name: Carolyn Berger MRN: [id number] Date of Birth: May 07, 1946 Referring Provider: Dr. Hoyt Koch  Encounter Date: 04/21/2016      PT End of Session - 04/21/16 1542    Visit Number 2   Number of Visits 10   Date for PT Re-Evaluation 06/10/16   Authorization Type KX modifier needed after visit 15   PT Start Time 1531   PT Stop Time 1615   PT Time Calculation (min) 44 min   Activity Tolerance Patient tolerated treatment well   Behavior During Therapy [facility] for tasks assessed/performed      Past Medical History:  Diagnosis Date  . Allergy    seasonal allergies, presently upper respiratory symptoms"runny nose, post nasal drip, rare cough_tx. Zpack  . Cataract   . GERD (gastroesophageal reflux disease)    controls with diet and rare OTC meds  . Heart murmur   . Mitral valve prolapse    with moderate mitral regurgitation-LOV Dr. Percival Spanish 10-22-13    Past Surgical History:  Procedure Laterality Date  . ANTERIOR CRUCIATE LIGAMENT REPAIR Left   . BREAST BIOPSY     of benign lesions  . CHOLECYSTECTOMY N/A 08/02/2014   Procedure: LAPAROSCOPIC CHOLECYSTECTOMY WITH INTRAOPERATIVE CHOLANGIOGRAM;  Surgeon: Pedro Earls, MD;  [facility];  Service: General;  Laterality: N/A;  . minicus Right   . TEE WITHOUT CARDIOVERSION  07/23/2011   Procedure: TRANSESOPHAGEAL ECHOCARDIOGRAM (TEE);  Surgeon: Loralie Champagne, MD;  Location: [place];  Service: Cardiovascular;  Laterality: N/A;  . TUBAL LIGATION      There were no vitals filed for this visit.      Subjective Assessment - 04/21/16 1541    Subjective I feel pretty good today, no pain.    Currently in Pain? No/denies   Multiple Pain Sites No                         [facility] Adult PT Treatment/Exercise -  04/21/16 0001      Manual Therapy   Manual Therapy Myofascial release;Soft tissue mobilization   Manual therapy comments STM to suboccipitals Rt side, cervical paraspinals bilateral; MFR cervical paraspinals                PT Education - 04/21/16 1546    Education provided Yes   Education Details Posture ed, initial HEP for cervical ROM, yellow band supine unattached scap exs, band given   Person(s) Educated Patient   Methods Explanation;Demonstration;Tactile cues;Verbal cues;Handout   Comprehension Verbalized understanding;Returned demonstration          PT Short Term Goals - 04/15/16 0946      PT SHORT TERM GOAL #1   Title Pt will be independent with initial HEP   Time 4   Period Weeks   Status New     PT SHORT TERM GOAL #2   Title Pt will be able to turn head for looking over her shoulder when driving wihtout increased pain    Time 4   Period Weeks   Status New     PT SHORT TERM GOAL #3   Title Pt will be able to perform functional squat without hyperactivity of UT muscles in order to do warm ups in karate classes.   Time 4   Period Weeks   Status New  PT Long Term Goals - 04/15/16 0959      PT LONG TERM GOAL #1   Title Pt will be independent with advanced HEP   Time 8   Period Weeks   Status New     PT LONG TERM GOAL #2   Title FOTO < or += to 35%   Time 8   Period Weeks   Status New     PT LONG TERM GOAL #3   Title Pt will demonstrate improved posture with greater shoulder depression and be able to sustain it during reaching forward and overhead activities   Time 8   Period Weeks   Status New     PT LONG TERM GOAL #4   Title Pt will be able to return to karate class without increased neck or shoulder pain.   Time 8   Period Weeks   Status New               Plan - 04/21/16 1542    Clinical Impression Statement Pt reports no significant pain since being here last as she feels the prednisone has really helped reduce  inflamation. Pt was educated in posture, cervical ROM exercises and initial band scapular exericises for posture. Pt had very minor tender trigger point in her RT SCM today, otherwise muscles felt pretty supple.    Rehab Potential Excellent   Clinical Impairments Affecting Rehab Potential none   PT Frequency 2x / week   PT Duration 8 weeks   PT Treatment/Interventions Cryotherapy;Moist Heat;Electrical Stimulation;Functional mobility training;Therapeutic activities;Therapeutic exercise;Neuromuscular re-education;Patient/family education;Manual techniques;Passive range of motion   Consulted and Agree with Plan of Care Patient      Patient will benefit from skilled therapeutic intervention in order to improve the following deficits and impairments:  Decreased coordination, Decreased strength, Postural dysfunction, Pain, Hypomobility, Decreased range of motion  Visit Diagnosis: Cervicalgia  Other muscle spasm  Unspecified lack of coordination     Problem List Patient Active Problem List   Diagnosis Date Noted  . Genetic testing 10/09/2014  . History of laparoscopic cholecystectomy 08/02/2014  . Psoriasis 10/02/2012  . Dyspnea 07/01/2011  . HYPERLIPIDEMIA 12/23/2008  . MITRAL REGURGITATION 12/20/2008  . Mitral valve disorder 12/20/2008    Carolyn Berger, PTA 04/21/2016, 4:20 PM  [facility] [address], [place], [place], [address] Phone: [phone]   Fax:  [phone]  Name: Carolyn Berger MRN: [id number] Date of Birth: 1945-07-06

## 2016-04-21 NOTE — Patient Instructions (Signed)
AROM: Neck Rotation   Sit with good posture, lift your heart. Turn head slowly to look over one shoulder, then the other. Do slow and smooth.  Repeat _5-10___ times per set. Sprinkle this throughout the day .   http://orth.exer.us/294   Copyright  VHI. All rights reserved.  AROM: Lateral Neck Flexion   Sit with excellent posture, lift your heart. This one you might want to use a mirror to make sure the motion is correct. Slowly tilt head toward one shoulder, then the other. Hold each position __3__ seconds. Repeat _5-10___ times per set. Practice this 1-2 x day.  http://orth.exer.us/296   Copyright  VHI. All rights reserved.  .  Copyright  VHI. All rights reserved.  AROM: Neck Flexion   Bend head forward. Hold __5__ seconds. Repeat __10__ times per set. Do __1__ sets per session. Do __2__ sessions per day. Remember to try this if your arm pain returns.   http://orth.exer.us/298   Copyright  VHI. All rights reserved.   PNF Strengthening: Resisted   Do this laying down first for 1-2 weeks, progress to sitting with excellent posture, put resistive band around each hand, bring right arm up and away, thumb back. Repeat _10___ times per set. Do _2___ sets per session. Do _1-2___ sessions per day.      Resisted Horizontal Abduction: Bilateral   Laying down initially, then progress to sit or stand, tubing in both hands, arms out in front. Keeping arms straight, pinch shoulder blades together and stretch arms out. Repeat _10___ times per set. Do 2____ sets per session. Do _1-2___ sessions per day.                                                                  Scapular Retraction: Elbow Flexion (Standing)   With elbows bent to 90, pinch shoulder blades together and rotate arms out, keeping elbows bent. Repeat _10___ times per set. Do _1___ sets per session. Do many____ sessions per day.

## 2016-04-23 ENCOUNTER — Encounter: Payer: Self-pay | Admitting: Physical Therapy

## 2016-04-23 ENCOUNTER — Ambulatory Visit: Payer: Medicare Other | Admitting: Physical Therapy

## 2016-04-23 DIAGNOSIS — R279 Unspecified lack of coordination: Secondary | ICD-10-CM | POA: Diagnosis not present

## 2016-04-23 DIAGNOSIS — Z961 Presence of intraocular lens: Secondary | ICD-10-CM | POA: Diagnosis not present

## 2016-04-23 DIAGNOSIS — H43813 Vitreous degeneration, bilateral: Secondary | ICD-10-CM | POA: Diagnosis not present

## 2016-04-23 DIAGNOSIS — M542 Cervicalgia: Secondary | ICD-10-CM | POA: Diagnosis not present

## 2016-04-23 DIAGNOSIS — M62838 Other muscle spasm: Secondary | ICD-10-CM | POA: Diagnosis not present

## 2016-04-23 NOTE — Patient Instructions (Addendum)
Flexibility: Corner Stretch    Standing in corner or doorway with hands just above shoulder level and step forward (don't lean on arms). Hold __30__ seconds. Repeat __3__ times per set. Do __1__ sets per session. Do __2__ sessions per day.  http://orth.exer.us/342   Copyright  VHI. All rights reserved.  Strengthening: Resisted Extension    Hold tubing in right hand, arm forward. Pull arm back, elbow straight. Repeat _15___ times per set. Do __2__ sets per session. Do __1__ sessions per day.  http://orth.exer.us/832   Copyright  VHI. All rights reserved.

## 2016-04-23 NOTE — Therapy (Signed)
[facility] [address] [place], [place], [address] Phone: [phone]   Fax:  [phone]  Physical Therapy Treatment  Patient Details  Name: Carolyn Berger MRN: [id number] Date of Birth: [birth year] Referring Provider: Dr. Hoyt Koch  Encounter Date: 04/23/2016      PT End of Session - 04/23/16 0754    Visit Number 3   Number of Visits 10   Date for PT Re-Evaluation 06/10/16   Authorization Type KX modifier needed after visit 15   PT Start Time 0756   PT Stop Time 0838   PT Time Calculation (min) 42 min   Activity Tolerance Patient tolerated treatment well   Behavior During Therapy [facility] for tasks assessed/performed      Past Medical History:  Diagnosis Date  . Allergy    seasonal allergies, presently upper respiratory symptoms"runny nose, post nasal drip, rare cough_tx. Zpack  . Cataract   . GERD (gastroesophageal reflux disease)    controls with diet and rare OTC meds  . Heart murmur   . Mitral valve prolapse    with moderate mitral regurgitation-LOV Dr. Percival Spanish 10-22-13    Past Surgical History:  Procedure Laterality Date  . ANTERIOR CRUCIATE LIGAMENT REPAIR Left   . BREAST BIOPSY     of benign lesions  . CHOLECYSTECTOMY N/A 08/02/2014   Procedure: LAPAROSCOPIC CHOLECYSTECTOMY WITH INTRAOPERATIVE CHOLANGIOGRAM;  Surgeon: Pedro Earls, MD;  [facility]: [facility];  Service: General;  Laterality: N/A;  . minicus Right   . TEE WITHOUT CARDIOVERSION  07/23/2011   Procedure: TRANSESOPHAGEAL ECHOCARDIOGRAM (TEE);  Surgeon: Loralie Champagne, MD;  Location: [place];  Service: Cardiovascular;  Laterality: N/A;  . TUBAL LIGATION      There were no vitals filed for this visit.      Subjective Assessment - 04/23/16 0758    Subjective I did my karate work out yesterday and the neck felt fine but my lower back was hurting.    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 No/denies                         [facility] Adult PT Treatment/Exercise - 04/23/16 0001      Exercises   Exercises Shoulder     Neck Exercises: Machines for Strengthening   UBE (Upper Arm Bike) Level 2 6 minutes   Other Machines for Strengthening lat pull down - 20# 2x10     Neck Exercises: Theraband   Shoulder Extension 20 reps;Red  standing   Rows 20 reps;Red  standing   Shoulder External Rotation 20 reps;Red  standing: bilateral ER "flashers"     Neck Exercises: Supine   Other Supine Exercise thoracic extension mobs with towel - 10x 3 sec     Shoulder Exercises: Supine   Other Supine Exercises bridge with shoulder extension - red band 10x; 10x without shoulder extension     Shoulder Exercises: Stretch   Corner Stretch 3 reps;30 seconds  done in doorway     Manual Therapy   Manual Therapy Joint mobilization;Soft tissue mobilization   Joint Mobilization A/P mobs T1-5 grade III 3x20sec   Soft tissue mobilization bilateral rhomboids, UT, middle traps                PT Education - 04/23/16 0844    Education provided Yes   Education  Details pec stretch, standing shoulder ext with band added to HEP and performed new exercises with VC   Person(s) Educated Patient   Methods Explanation;Handout;Demonstration;Verbal cues   Comprehension Verbalized understanding;Returned demonstration          PT Short Term Goals - 04/21/16 1619      PT SHORT TERM GOAL #1   Title Pt will be independent with initial HEP   Time 4   Period Weeks   Status On-going     PT SHORT TERM GOAL #2   Title Pt will be able to turn head for looking over her shoulder when driving wihtout increased pain    Time 4   Period Weeks   Status Achieved           PT Long Term Goals - 04/15/16 0959      PT LONG TERM GOAL #1   Title Pt will be independent with advanced HEP   Time 8   Period Weeks    Status New     PT LONG TERM GOAL #2   Title FOTO < or += to 35%   Time 8   Period Weeks   Status New     PT LONG TERM GOAL #3   Title Pt will demonstrate improved posture with greater shoulder depression and be able to sustain it during reaching forward and overhead activities   Time 8   Period Weeks   Status New     PT LONG TERM GOAL #4   Title Pt will be able to return to karate class without increased neck or shoulder pain.   Time 8   Period Weeks   Status New               Plan - 04/23/16 0858    Clinical Impression Statement Patient will be away next week.  Patient doing well with exercises but demonstrates difficulty with scapular adn thoracic spine mobility and is limited in motion.  Continues to need greater strength in mid/low back as well as improved joint mobility in thoracic spine for correcting posture during functional activities.   Rehab Potential Excellent   PT Frequency 2x / week   PT Duration 8 weeks   PT Treatment/Interventions Cryotherapy;Moist Heat;Electrical Stimulation;Functional mobility training;Therapeutic activities;Therapeutic exercise;Neuromuscular re-education;Patient/family education;Manual techniques;Passive range of motion   PT Next Visit Plan manual techniques to thoracic spine increase extension and scapula mobility, progress postural strength as tolerated   PT Home Exercise Plan progress as needed   Consulted and Agree with Plan of Care Patient      Patient will benefit from skilled therapeutic intervention in order to improve the following deficits and impairments:  Decreased coordination, Decreased strength, Postural dysfunction, Pain, Hypomobility, Decreased range of motion  Visit Diagnosis: Cervicalgia  Other muscle spasm  Unspecified lack of coordination     Problem List Patient Active Problem List   Diagnosis Date Noted  . Genetic testing 10/09/2014  . History of laparoscopic cholecystectomy 08/02/2014  . Psoriasis  10/02/2012  . Dyspnea 07/01/2011  . HYPERLIPIDEMIA 12/23/2008  . MITRAL REGURGITATION 12/20/2008  . Mitral valve disorder 12/20/2008    Zannie Cove, PT 04/23/2016, 9:03 AM  [facility] [address], [place], [place], [address] Phone: [phone]   Fax:  [phone]  Name: Carolyn Berger MRN: [id number] Date of Birth: [birth year]

## 2016-05-03 ENCOUNTER — Ambulatory Visit: Payer: Medicare Other | Admitting: Physical Therapy

## 2016-05-03 ENCOUNTER — Encounter: Payer: Self-pay | Admitting: Physical Therapy

## 2016-05-03 DIAGNOSIS — R279 Unspecified lack of coordination: Secondary | ICD-10-CM

## 2016-05-03 DIAGNOSIS — M542 Cervicalgia: Secondary | ICD-10-CM | POA: Diagnosis not present

## 2016-05-03 DIAGNOSIS — M62838 Other muscle spasm: Secondary | ICD-10-CM | POA: Diagnosis not present

## 2016-05-03 NOTE — Therapy (Signed)
[facility] [address], [place], [place], [address] Phone: [phone]   Fax:  [phone]  Physical Therapy Treatment  Patient Details  Name: Carolyn Berger MRN: [id number] Date of Birth: [birth year] Referring Provider: Dr. Hoyt Koch  Encounter Date: 05/03/2016      PT End of Session - 05/03/16 1546    Visit Number 4   Number of Visits 10   Date for PT Re-Evaluation 06/10/16   Authorization Type KX modifier needed after visit 15   PT Start Time 1532   PT Stop Time 1613   PT Time Calculation (min) 41 min   Activity Tolerance Patient tolerated treatment well   Behavior During Therapy [facility] for tasks assessed/performed      Past Medical History:  Diagnosis Date  . Allergy    seasonal allergies, presently upper respiratory symptoms"runny nose, post nasal drip, rare cough_tx. Zpack  . Cataract   . GERD (gastroesophageal reflux disease)    controls with diet and rare OTC meds  . Heart murmur   . Mitral valve prolapse    with moderate mitral regurgitation-LOV Dr. Percival Spanish 10-22-13    Past Surgical History:  Procedure Laterality Date  . ANTERIOR CRUCIATE LIGAMENT REPAIR Left   . BREAST BIOPSY     of benign lesions  . CHOLECYSTECTOMY N/A 08/02/2014   Procedure: LAPAROSCOPIC CHOLECYSTECTOMY WITH INTRAOPERATIVE CHOLANGIOGRAM;  Surgeon: Pedro Earls, MD;  [facility];  Service: General;  Laterality: N/A;  . minicus Right   . TEE WITHOUT CARDIOVERSION  07/23/2011   Procedure: TRANSESOPHAGEAL ECHOCARDIOGRAM (TEE);  Surgeon: Loralie Champagne, MD;  Location: [place];  Service: Cardiovascular;  Laterality: N/A;  . TUBAL LIGATION      There were no vitals filed for this visit.      Subjective Assessment - 05/03/16 1535    Subjective States her neck is sore after driving back from [facility]   Limitations Sitting;Lifting   Currently in Pain? Yes   Pain Score 5    Pain Location Neck   Pain Descriptors  / Indicators Aching;Dull   Pain Type Acute pain   Pain Radiating Towards R shoulder   Pain Onset 1 to 4 weeks ago   Multiple Pain Sites No                         [facility] Adult PT Treatment/Exercise - 05/03/16 0001      Neck Exercises: Machines for Strengthening   UBE (Upper Arm Bike) Level 4, 4 minutes     Shoulder Exercises: Supine   Other Supine Exercises on foam roll - pec stretch with pillows under arms - 2 min     Shoulder Exercises: Standing   Extension Strengthening;Right;Left;20 reps;Theraband   Theraband Level (Shoulder Extension) Level 1 (Yellow)   Row Both;20 reps;Theraband   Theraband Level (Shoulder Row) Level 2 (Red)   Other Standing Exercises lat pull down with red theraband in doorway - 30x     Shoulder Exercises: ROM/Strengthening   Wall Wash flexion and abduction for stretch 10x each way     Manual Therapy   Joint Mobilization A/P mobs T1-5 grade III 3x20sec   Soft tissue mobilization bilateral rhomboids, UT, middle traps                PT Education - 05/03/16 1618    Education provided Yes   Education Details lat pulls with theraband in doorway added to [employer]) Educated  Patient   Methods Explanation;Handout   Comprehension Verbalized understanding          PT Short Term Goals - 05/03/16 1637      PT SHORT TERM GOAL #1   Title Pt will be independent with initial HEP   Time 4   Period Weeks   Status Achieved     PT SHORT TERM GOAL #2   Title Pt will be able to turn head for looking over her shoulder when driving wihtout increased pain    Time 4   Period Weeks   Status Achieved     PT SHORT TERM GOAL #3   Title Pt will be able to perform functional squat without hyperactivity of UT muscles in order to do warm ups in karate classes.   Time 4   Period Weeks   Status On-going           PT Long Term Goals - 05/03/16 1638      PT LONG TERM GOAL #1   Title Pt will be independent with advanced HEP   Time 8    Period Weeks   Status On-going     PT LONG TERM GOAL #2   Title FOTO < or += to 35%   Time 8   Period Weeks   Status On-going     PT LONG TERM GOAL #3   Title Pt will demonstrate improved posture with greater shoulder depression and be able to sustain it during reaching forward and overhead activities   Time 8   Period Weeks   Status On-going     PT LONG TERM GOAL #4   Title Pt will be able to return to karate class without increased neck or shoulder pain.   Time 8   Period Weeks   Status On-going               Plan - 05/03/16 1631    Clinical Impression Statement Pt having a flare up today due to sitting for long car ride from [place] head.  Pt tolerated exercises well and able to progress.  Pt performed thoracic extension in chair and showed some improved movement when performing after manual therapy.  Pt continues to demonstrate increased thoracic kyphosis, forward head and rounded shoulders.  Pt will needs skilled PT to strengthen patient for greater endurance upright posture during activites.   Rehab Potential Excellent   Clinical Impairments Affecting Rehab Potential none   PT Frequency 2x / week   PT Duration 8 weeks   PT Treatment/Interventions Cryotherapy;Moist Heat;Electrical Stimulation;Functional mobility training;Therapeutic activities;Therapeutic exercise;Neuromuscular re-education;Patient/family education;Manual techniques;Passive range of motion   PT Next Visit Plan manual techniques to thoracic spine increase extension and scapula mobility, progress exercises on power tower as tolerated   PT Home Exercise Plan progress as needed   Consulted and Agree with Plan of Care Patient      Patient will benefit from skilled therapeutic intervention in order to improve the following deficits and impairments:  Decreased coordination, Decreased strength, Postural dysfunction, Pain, Hypomobility, Decreased range of motion  Visit Diagnosis: Cervicalgia  Other  muscle spasm  Unspecified lack of coordination     Problem List Patient Active Problem List   Diagnosis Date Noted  . Genetic testing 10/09/2014  . History of laparoscopic cholecystectomy 08/02/2014  . Psoriasis 10/02/2012  . Dyspnea 07/01/2011  . HYPERLIPIDEMIA 12/23/2008  . MITRAL REGURGITATION 12/20/2008  . Mitral valve disorder 12/20/2008    Zannie Cove, PT 05/03/2016, 4:43 PM  [facility] [address] [place], [place], [address] Phone: [phone]   Fax:  [phone]  Name: Carolyn Berger MRN: [id number] Date of Birth: [birth year]

## 2016-05-03 NOTE — Patient Instructions (Signed)
Extension: Lat Pull    Facing pulley, pull bar behind head, holding neck and back still. Repeat _30___ times per set. Do __1__ sets per session. Do ____ sessions per week. Use ____band in doorway  Copyright  VHI. All rights reserved.

## 2016-05-05 ENCOUNTER — Ambulatory Visit: Payer: Medicare Other | Admitting: Physical Therapy

## 2016-05-05 ENCOUNTER — Encounter: Payer: Self-pay | Admitting: Physical Therapy

## 2016-05-05 DIAGNOSIS — R279 Unspecified lack of coordination: Secondary | ICD-10-CM | POA: Diagnosis not present

## 2016-05-05 DIAGNOSIS — M62838 Other muscle spasm: Secondary | ICD-10-CM | POA: Diagnosis not present

## 2016-05-05 DIAGNOSIS — M542 Cervicalgia: Secondary | ICD-10-CM

## 2016-05-05 NOTE — Therapy (Signed)
[facility] [address] [place], [place], [address] Phone: [phone]   Fax:  [phone]  Physical Therapy Treatment  Patient Details  Name: Carolyn Berger MRN: [id number] Date of Birth: 1945/12/19 Referring Provider: Dr. Hoyt Koch  Encounter Date: 05/05/2016      PT End of Session - 05/05/16 0928    Visit Number 5   Number of Visits 10   Date for PT Re-Evaluation 06/10/16   Authorization Type KX modifier needed after visit 15   PT Start Time 0925   PT Stop Time 1020   PT Time Calculation (min) 55 min   Activity Tolerance Patient tolerated treatment well   Behavior During Therapy [facility] for tasks assessed/performed      Past Medical History:  Diagnosis Date  . Allergy    seasonal allergies, presently upper respiratory symptoms"runny nose, post nasal drip, rare cough_tx. Zpack  . Cataract   . GERD (gastroesophageal reflux disease)    controls with diet and rare OTC meds  . Heart murmur   . Mitral valve prolapse    with moderate mitral regurgitation-LOV Dr. Percival Spanish 10-22-13    Past Surgical History:  Procedure Laterality Date  . ANTERIOR CRUCIATE LIGAMENT REPAIR Left   . BREAST BIOPSY     of benign lesions  . CHOLECYSTECTOMY N/A 08/02/2014   Procedure: LAPAROSCOPIC CHOLECYSTECTOMY WITH INTRAOPERATIVE CHOLANGIOGRAM;  Surgeon: Pedro Earls, MD;  [facility];  Service: General;  Laterality: N/A;  . minicus Right   . TEE WITHOUT CARDIOVERSION  07/23/2011   Procedure: TRANSESOPHAGEAL ECHOCARDIOGRAM (TEE);  Surgeon: Loralie Champagne, MD;  Location: [place];  Service: Cardiovascular;  Laterality: N/A;  . TUBAL LIGATION      There were no vitals filed for this visit.      Subjective Assessment - 05/05/16 0930    Subjective Better since last session. No pain this AM.    Currently in Pain? No/denies   Multiple Pain Sites No                         [facility] Adult PT  Treatment/Exercise - 05/05/16 0001      Neck Exercises: Machines for Strengthening   UBE (Upper Arm Bike) L2 3x3  Concurrrent review of status     Shoulder Exercises: Supine   Other Supine Exercises supine foam roll shoulder openers and thoracic release  VC througout the series for technique   Other Supine Exercises red band scap unattached 2x10 each  On foam roll     Shoulder Exercises: Standing   Row Strengthening;Both;Theraband   Theraband Level (Shoulder Row) Level 2 (Red)  2x15     Manual Therapy   Soft tissue mobilization Cervical RT>LT with static stretching to RT scalenes and levator                  PT Short Term Goals - 05/03/16 1637      PT SHORT TERM GOAL #1   Title Pt will be independent with initial HEP   Time 4   Period Weeks   Status Achieved     PT SHORT TERM GOAL #2   Title Pt will be able to turn head for looking over her shoulder when driving wihtout increased pain    Time 4   Period Weeks   Status Achieved     PT SHORT TERM GOAL #3   Title Pt will be able to perform functional squat without hyperactivity of  UT muscles in order to do warm ups in karate classes.   Time 4   Period Weeks   Status On-going           PT Long Term Goals - 05/03/16 1638      PT LONG TERM GOAL #1   Title Pt will be independent with advanced HEP   Time 8   Period Weeks   Status On-going     PT LONG TERM GOAL #2   Title FOTO < or += to 35%   Time 8   Period Weeks   Status On-going     PT LONG TERM GOAL #3   Title Pt will demonstrate improved posture with greater shoulder depression and be able to sustain it during reaching forward and overhead activities   Time 8   Period Weeks   Status On-going     PT LONG TERM GOAL #4   Title Pt will be able to return to karate class without increased neck or shoulder pain.   Time 8   Period Weeks   Status On-going               Plan - 05/05/16 0929    Clinical Impression Statement Pt was able to  practice her katas for karate yesterday and did ok except for a little bit of feeling off balance. Plans to go to karate class tonight to try.  Worked on shoulder, thoaracic and cervical release techniques today using the soft foam rol. Pt was given info on where and how to purchase a roll to add this routine to her HEP .     Rehab Potential Excellent   Clinical Impairments Affecting Rehab Potential none   PT Frequency 2x / week   PT Duration 8 weeks   PT Treatment/Interventions Cryotherapy;Moist Heat;Electrical Stimulation;Functional mobility training;Therapeutic activities;Therapeutic exercise;Neuromuscular re-education;Patient/family education;Manual techniques;Passive range of motion   PT Next Visit Plan Continue with increasing scapular, thoracic cervical AROM. Follow up with pt how she did at [facility] class.    Consulted and Agree with Plan of Care Patient      Patient will benefit from skilled therapeutic intervention in order to improve the following deficits and impairments:  Decreased coordination, Decreased strength, Postural dysfunction, Pain, Hypomobility, Decreased range of motion  Visit Diagnosis: Cervicalgia  Other muscle spasm  Unspecified lack of coordination     Problem List Patient Active Problem List   Diagnosis Date Noted  . Genetic testing 10/09/2014  . History of laparoscopic cholecystectomy 08/02/2014  . Psoriasis 10/02/2012  . Dyspnea 07/01/2011  . HYPERLIPIDEMIA 12/23/2008  . MITRAL REGURGITATION 12/20/2008  . Mitral valve disorder 12/20/2008    COCHRAN,JENNIFER, PTA 05/05/2016, 10:21 AM  [facility] [address], [place], [place], [address] Phone: [phone]   Fax:  [phone]  Name: Carolyn Berger MRN: [id number] Date of Birth: 11/27/1945

## 2016-05-07 DIAGNOSIS — R197 Diarrhea, unspecified: Secondary | ICD-10-CM | POA: Diagnosis not present

## 2016-05-10 ENCOUNTER — Ambulatory Visit: Payer: Medicare Other | Attending: Emergency Medicine | Admitting: Physical Therapy

## 2016-05-10 ENCOUNTER — Encounter: Payer: Self-pay | Admitting: Physical Therapy

## 2016-05-10 DIAGNOSIS — L400 Psoriasis vulgaris: Secondary | ICD-10-CM | POA: Diagnosis not present

## 2016-05-10 DIAGNOSIS — M542 Cervicalgia: Secondary | ICD-10-CM | POA: Diagnosis not present

## 2016-05-10 DIAGNOSIS — M62838 Other muscle spasm: Secondary | ICD-10-CM | POA: Diagnosis not present

## 2016-05-10 DIAGNOSIS — R279 Unspecified lack of coordination: Secondary | ICD-10-CM | POA: Insufficient documentation

## 2016-05-10 NOTE — Therapy (Signed)
[facility] [address] [place], [place], [address] Phone: [phone]   Fax:  [phone]  Physical Therapy Treatment  Patient Details  Name: Carolyn Berger MRN: [id number] Date of Birth: 1946/01/30 Referring Provider: Dr. Hoyt Koch  Encounter Date: 05/10/2016      PT End of Session - 05/10/16 1447    Visit Number 6   Number of Visits 10   Date for PT Re-Evaluation 06/10/16   Authorization Type KX modifier needed after visit 15   PT Start Time 1402   PT Stop Time 1445   PT Time Calculation (min) 43 min   Activity Tolerance Patient tolerated treatment well   Behavior During Therapy [facility] for tasks assessed/performed      Past Medical History:  Diagnosis Date  . Allergy    seasonal allergies, presently upper respiratory symptoms"runny nose, post nasal drip, rare cough_tx. Zpack  . Cataract   . GERD (gastroesophageal reflux disease)    controls with diet and rare OTC meds  . Heart murmur   . Mitral valve prolapse    with moderate mitral regurgitation-LOV Dr. Percival Spanish 10-22-13    Past Surgical History:  Procedure Laterality Date  . ANTERIOR CRUCIATE LIGAMENT REPAIR Left   . BREAST BIOPSY     of benign lesions  . CHOLECYSTECTOMY N/A 08/02/2014   Procedure: LAPAROSCOPIC CHOLECYSTECTOMY WITH INTRAOPERATIVE CHOLANGIOGRAM;  Surgeon: Pedro Earls, MD;  Location: [facility];  Service: General;  Laterality: N/A;  . minicus Right   . TEE WITHOUT CARDIOVERSION  07/23/2011   Procedure: TRANSESOPHAGEAL ECHOCARDIOGRAM (TEE);  Surgeon: Loralie Champagne, MD;  Location: [place];  Service: Cardiovascular;  Laterality: N/A;  . TUBAL LIGATION      There were no vitals filed for this visit.      Subjective Assessment - 05/10/16 1401    Subjective Pt states she went off nerve medication which was helping her sleep.  States her Rt neck and shoulder are sore today.     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 Yes   Pain Score 6    Pain Location Neck   Pain Orientation Right   Pain Descriptors / Indicators Aching;Dull   Pain Type Acute pain   Pain Radiating Towards Rt shoulder blade   Aggravating Factors  moving around   Pain Relieving Factors not moving   Multiple Pain Sites No                         [facility] Adult PT Treatment/Exercise - 05/10/16 0001      Neck Exercises: Machines for Strengthening   UBE (Upper Arm Bike) L2 3 min backwards  Concurrrent review of status     Neck Exercises: Theraband   Other Theraband Exercises upper trap stretch to the Lt 3x 20 sec     Neck Exercises: Seated   Shoulder ABduction 10 reps  yellow theraband   Upper Extremity D2 20 reps;Theraband   Theraband Level (UE D2) Level 1 (Yellow)   Other Seated Exercise upper trap stretch 3x20 sec   Other Seated Exercise levator scap stretch     Neck Exercises: Supine   Neck Retraction 10 reps;5 secs   Capital Flexion 20 reps   Lateral Flexion Left;10 reps     Shoulder  Exercises: Supine   Horizontal ABduction 20 reps;Theraband   Theraband Level (Shoulder Horizontal ABduction) Level 1 (Yellow)     Shoulder Exercises: Seated   Retraction Strengthening;20 reps     Manual Therapy   Manual Therapy Soft tissue mobilization;Myofascial release   Soft tissue mobilization Cervical RT>LT with static stretching to RT scalenes and levator   Myofascial Release posterior cervical myofascial release                PT Education - 05/10/16 1445    Education provided Yes   Education Details educated on ROM exercises and cervical retractions as seen in handout   Person(s) Educated Patient   Methods Explanation;Handout   Comprehension Verbalized understanding          PT Short Term Goals - 05/03/16 1637      PT SHORT TERM GOAL #1   Title  Pt will be independent with initial HEP   Time 4   Period Weeks   Status Achieved     PT SHORT TERM GOAL #2   Title Pt will be able to turn head for looking over her shoulder when driving wihtout increased pain    Time 4   Period Weeks   Status Achieved     PT SHORT TERM GOAL #3   Title Pt will be able to perform functional squat without hyperactivity of UT muscles in order to do warm ups in karate classes.   Time 4   Period Weeks   Status On-going           PT Long Term Goals - 05/10/16 1642      PT LONG TERM GOAL #1   Title Pt will be independent with advanced HEP   Time 8   Period Weeks   Status On-going     PT LONG TERM GOAL #2   Title FOTO < or += to 35%   Time 8   Period Weeks   Status On-going     PT LONG TERM GOAL #3   Title Pt will demonstrate improved posture with greater shoulder depression and be able to sustain it during reaching forward and overhead activities   Time 8   Period Weeks   Status On-going     PT LONG TERM GOAL #4   Title Pt will be able to return to karate class without increased neck or shoulder pain.   Time 8   Period Weeks   Status On-going               Plan - 05/10/16 1643    Clinical Impression Statement Pt had flare up of pain today and attributes it to not taking neurontin since yesterday.  She states the pain got worse since going off that medication.  She responded well to cervical motions that were opening Rt cervical vertebra and did well performing exericses that were added to HEP.  Pt continues to need skilled PT to work on improved ROM and reduced pain in order to return to functional activities.   Rehab Potential Excellent   Clinical Impairments Affecting Rehab Potential none   PT Frequency 2x / week   PT Duration 8 weeks   PT Treatment/Interventions Cryotherapy;Moist Heat;Electrical Stimulation;Functional mobility training;Therapeutic activities;Therapeutic exercise;Neuromuscular re-education;Patient/family  education;Manual techniques;Passive range of motion   PT Next Visit Plan cont with vertebral opening ROM on Rt cervical spine and f/u with new exercises added to HEP, shoulder AROM 3 ways no resistance if tolerated next visit   PT Home  Exercise Plan progress as needed   Consulted and Agree with Plan of Care Patient      Patient will benefit from skilled therapeutic intervention in order to improve the following deficits and impairments:  Decreased coordination, Decreased strength, Postural dysfunction, Pain, Hypomobility, Decreased range of motion  Visit Diagnosis: Cervicalgia  Other muscle spasm  Unspecified lack of coordination     Problem List Patient Active Problem List   Diagnosis Date Noted  . Genetic testing 10/09/2014  . History of laparoscopic cholecystectomy 08/02/2014  . Psoriasis 10/02/2012  . Dyspnea 07/01/2011  . HYPERLIPIDEMIA 12/23/2008  . MITRAL REGURGITATION 12/20/2008  . Mitral valve disorder 12/20/2008    Zannie Cove, PT 05/10/2016, 4:53 PM  [facility] [address] [place], [place], [address] Phone: [phone]   Fax:  [phone]  Name: Carolyn Berger MRN: [id number] Date of Birth: June 08, 1945

## 2016-05-10 NOTE — Patient Instructions (Signed)
Levator Scapula Stretch, Sitting    Sit, one hand tucked under hip on side to be stretched, other hand over top of head. Turn head toward other side and look down. Use hand on head to gently stretch neck in that position. Hold _5__ seconds. Repeat _10__ times per session. Do ___ sessions per day.  Copyright  VHI. All rights reserved.  Ear / Shoulder Stretch    Exhaling, move left ear toward left shoulder. Hold position for ___ breaths. Inhaling, bring head back to center. Repeat to other side. Repeat sequence ___ times. Do ___ times per day.  Copyright  VHI. All rights reserved.  Nod: Cervical Flexion    Nod head, tipping chin down. Tighten muscles in the back of throat. Do _20__ times, ___ times per day.  http://ss.exer.us/191   Copyright  VHI. All rights reserved.  Neck: Retraction    Sit with back and head straight. Pull chin back to line up ear with shoulder. Do not turn or tilt head. May assist if child cannot keep correct position. Hold __5__ seconds. Repeat _10___ times. Do ____ sessions per day. CAUTION: Movement should be gentle, steady and slow.  Copyright  VHI. All rights reserved.

## 2016-05-11 DIAGNOSIS — R197 Diarrhea, unspecified: Secondary | ICD-10-CM | POA: Diagnosis not present

## 2016-05-14 ENCOUNTER — Encounter: Payer: Self-pay | Admitting: Physical Therapy

## 2016-05-14 ENCOUNTER — Ambulatory Visit: Payer: Medicare Other | Admitting: Physical Therapy

## 2016-05-14 DIAGNOSIS — M62838 Other muscle spasm: Secondary | ICD-10-CM | POA: Diagnosis not present

## 2016-05-14 DIAGNOSIS — M542 Cervicalgia: Secondary | ICD-10-CM | POA: Diagnosis not present

## 2016-05-14 DIAGNOSIS — R279 Unspecified lack of coordination: Secondary | ICD-10-CM | POA: Diagnosis not present

## 2016-05-14 NOTE — Therapy (Signed)
[facility] [address], [place], [place], [address] Phone: [phone]   Fax:  [phone]  Physical Therapy Treatment  Patient Details  Name: Carolyn Berger MRN: [id number] Date of Birth: 28-Jun-1945 Referring Provider: Dr. Hoyt Koch  Encounter Date: 05/14/2016      PT End of Session - 05/14/16 0927    Visit Number 7   Number of Visits 10   Date for PT Re-Evaluation 06/10/16   Authorization Type KX modifier needed after visit 15   PT Start Time 0920   PT Stop Time 1016   PT Time Calculation (min) 56 min   Activity Tolerance Patient tolerated treatment well   Behavior During Therapy [facility] for tasks assessed/performed      Past Medical History:  Diagnosis Date  . Allergy    seasonal allergies, presently upper respiratory symptoms"runny nose, post nasal drip, rare cough_tx. Zpack  . Cataract   . GERD (gastroesophageal reflux disease)    controls with diet and rare OTC meds  . Heart murmur   . Mitral valve prolapse    with moderate mitral regurgitation-LOV Dr. Percival Spanish 10-22-13    Past Surgical History:  Procedure Laterality Date  . ANTERIOR CRUCIATE LIGAMENT REPAIR Left   . BREAST BIOPSY     of benign lesions  . CHOLECYSTECTOMY N/A 08/02/2014   Procedure: LAPAROSCOPIC CHOLECYSTECTOMY WITH INTRAOPERATIVE CHOLANGIOGRAM;  Surgeon: Pedro Earls, MD;  [facility];  Service: General;  Laterality: N/A;  . minicus Right   . TEE WITHOUT CARDIOVERSION  07/23/2011   Procedure: TRANSESOPHAGEAL ECHOCARDIOGRAM (TEE);  Surgeon: Loralie Champagne, MD;  Location: [place];  Service: Cardiovascular;  Laterality: N/A;  . TUBAL LIGATION      There were no vitals filed for this visit.      Subjective Assessment - 05/14/16 0928    Subjective My RT shoulder tightness up after my karate lessons. I am modifying my karate, doing it slower and less force on your punches.    Currently in Pain? No/denies   Multiple Pain  Sites No                         [facility] Adult PT Treatment/Exercise - 05/14/16 0001      Shoulder Exercises: Supine   Other Supine Exercises supine foam roll shoulder openers and thoracic release  VC througout the series for technique,      Shoulder Exercises: Standing   Other Standing Exercises red band resisted front punches bil 10x  VC to work on increasing her power     Manual Therapy   Manual Therapy Soft tissue mobilization;Myofascial release   Soft tissue mobilization Cervical RT>LT with static stretching to RT scalenes and levator   Myofascial Release posterior cervical myofascial release                PT Education - 05/14/16 0942    Education provided Yes   Education Details MELT Method for Upper body decompression   Person(s) Educated Patient   Methods Explanation;Demonstration;Tactile cues;Verbal cues   Comprehension Verbalized understanding;Returned demonstration          PT Short Term Goals - 05/03/16 1637      PT SHORT TERM GOAL #1   Title Pt will be independent with initial HEP   Time 4   Period Weeks   Status Achieved     PT SHORT TERM GOAL #2   Title Pt will be able to turn head for  looking over her shoulder when driving wihtout increased pain    Time 4   Period Weeks   Status Achieved     PT SHORT TERM GOAL #3   Title Pt will be able to perform functional squat without hyperactivity of UT muscles in order to do warm ups in karate classes.   Time 4   Period Weeks   Status On-going           PT Long Term Goals - 05/10/16 1642      PT LONG TERM GOAL #1   Title Pt will be independent with advanced HEP   Time 8   Period Weeks   Status On-going     PT LONG TERM GOAL #2   Title FOTO < or += to 35%   Time 8   Period Weeks   Status On-going     PT LONG TERM GOAL #3   Title Pt will demonstrate improved posture with greater shoulder depression and be able to sustain it during reaching forward and overhead  activities   Time 8   Period Weeks   Status On-going     PT LONG TERM GOAL #4   Title Pt will be able to return to karate class without increased neck or shoulder pain.   Time 8   Period Weeks   Status On-going               Plan - 05/14/16 [id number]    Clinical Impression Statement Pt reports doing well this AM denying pain. She had just finished a karate workout with onoy some tightness reported in her RT shoulder. Through hands on and hands off release work to the shoullders and neck pt reports the RT shoulder feels like it is looser and feels like it is back more. Pt purchased a foam roll for home use.  She is independent in beginnning release techniques.    Rehab Potential Excellent   Clinical Impairments Affecting Rehab Potential none   PT Frequency 2x / week   PT Duration 8 weeks   PT Treatment/Interventions Cryotherapy;Moist Heat;Electrical Stimulation;Functional mobility training;Therapeutic activities;Therapeutic exercise;Neuromuscular re-education;Patient/family education;Manual techniques;Passive range of motion   PT Next Visit Plan Continue with Rt shoulder and neck mobility exercises, shoulder strength, and manual techniques for soft tissue.   Consulted and Agree with Plan of Care --      Patient will benefit from skilled therapeutic intervention in order to improve the following deficits and impairments:  Decreased coordination, Decreased strength, Postural dysfunction, Pain, Hypomobility, Decreased range of motion  Visit Diagnosis: Cervicalgia     Problem List Patient Active Problem List   Diagnosis Date Noted  . Genetic testing 10/09/2014  . History of laparoscopic cholecystectomy 08/02/2014  . Psoriasis 10/02/2012  . Dyspnea 07/01/2011  . HYPERLIPIDEMIA 12/23/2008  . MITRAL REGURGITATION 12/20/2008  . Mitral valve disorder 12/20/2008    Tamara Monteith, PTA 05/14/2016, 10:22 AM  [facility] [address] [place], [place], [address] Phone: [phone]   Fax:  [phone]  Name: Carolyn Berger MRN: [id number] Date of Birth: April 19, 1946

## 2016-05-17 ENCOUNTER — Encounter: Payer: Self-pay | Admitting: Physical Therapy

## 2016-05-17 ENCOUNTER — Ambulatory Visit: Payer: Medicare Other | Admitting: Physical Therapy

## 2016-05-17 DIAGNOSIS — M542 Cervicalgia: Secondary | ICD-10-CM | POA: Diagnosis not present

## 2016-05-17 DIAGNOSIS — R279 Unspecified lack of coordination: Secondary | ICD-10-CM | POA: Diagnosis not present

## 2016-05-17 DIAGNOSIS — M62838 Other muscle spasm: Secondary | ICD-10-CM | POA: Diagnosis not present

## 2016-05-17 NOTE — Therapy (Signed)
[facility] [address] [place], [place], [address] Phone: [phone]   Fax:  [phone]  Physical Therapy Treatment  Patient Details  Name: Carolyn Berger MRN: [id number] Date of Birth: September 01, 1945 Referring Provider: Dr. Hoyt Koch  Encounter Date: 05/17/2016      PT End of Session - 05/17/16 1104    Visit Number 8   Number of Visits 10   Date for PT Re-Evaluation 06/10/16   Authorization Type KX modifier needed after visit 15   PT Start Time 1100   PT Stop Time 1145   PT Time Calculation (min) 45 min   Activity Tolerance Patient tolerated treatment well   Behavior During Therapy [facility] for tasks assessed/performed      Past Medical History:  Diagnosis Date  . Allergy    seasonal allergies, presently upper respiratory symptoms"runny nose, post nasal drip, rare cough_tx. Zpack  . Cataract   . GERD (gastroesophageal reflux disease)    controls with diet and rare OTC meds  . Heart murmur   . Mitral valve prolapse    with moderate mitral regurgitation-LOV Dr. Percival Spanish 10-22-13    Past Surgical History:  Procedure Laterality Date  . ANTERIOR CRUCIATE LIGAMENT REPAIR Left   . BREAST BIOPSY     of benign lesions  . CHOLECYSTECTOMY N/A 08/02/2014   Procedure: LAPAROSCOPIC CHOLECYSTECTOMY WITH INTRAOPERATIVE CHOLANGIOGRAM;  Surgeon: Pedro Earls, MD;  [facility];  Service: General;  Laterality: N/A;  . minicus Right   . TEE WITHOUT CARDIOVERSION  07/23/2011   Procedure: TRANSESOPHAGEAL ECHOCARDIOGRAM (TEE);  Surgeon: Loralie Champagne, MD;  Location: [place];  Service: Cardiovascular;  Laterality: N/A;  . TUBAL LIGATION      There were no vitals filed for this visit.      Subjective Assessment - 05/17/16 1106    Subjective Each karate class I can do more, better balance, more force in my punches. Although, my shoulder still feels stiff and sore after. turnign neck better but still painful and  liimited.    Currently in Pain? No/denies   Aggravating Factors  Certain movements   Pain Relieving Factors rest, massage   Multiple Pain Sites No                         [facility] Adult PT Treatment/Exercise - 05/17/16 0001      Neck Exercises: Machines for Strengthening   UBE (Upper Arm Bike) L2 3 min backwards  Concurrrent review of status     Shoulder Exercises: Supine   Horizontal ABduction Strengthening;Both;20 reps;Theraband   Theraband Level (Shoulder Horizontal ABduction) Level 2 (Red)   Horizontal ABduction Weight (lbs) --  Then diagonals bil 10x    Other Supine Exercises supine foam roll shoulder openers and thoracic release  VC througout the series for technique,      Shoulder Exercises: Standing   Other Standing Exercises Counter top pushups 10x     Manual Therapy   Manual Therapy Soft tissue mobilization;Myofascial release   Soft tissue mobilization Cervical RT>LT with static stretching to RT scalenes and levator   Myofascial Release posterior cervical myofascial release                  PT Short Term Goals - 05/17/16 1104      PT SHORT TERM GOAL #3   Title Pt will be able to perform functional squat without hyperactivity of UT muscles in order to do warm ups in  karate classes.   Time 4   Period Weeks   Status Achieved           PT Long Term Goals - 05/17/16 1105      PT LONG TERM GOAL #1   Title Pt will be independent with advanced HEP   Time 8   Period Weeks   Status On-going     PT LONG TERM GOAL #4   Title Pt will be able to return to karate class without increased neck or shoulder pain.   Time 8   Period Weeks   Status On-going               Plan - 05/17/16 1104    Clinical Impression Statement Karate classes are improving per pt report today. Remains with RT shoulder stiffness and soreness, and slight ly reduced cervical rotation. Introduced pushups at the [employer] today as this is part of pt's normal  training for [employer]. Pt was able to demonstrate without shoulder or neck pain.      Rehab Potential Excellent   PT Frequency 2x / week   PT Duration 8 weeks   PT Treatment/Interventions Cryotherapy;Moist Heat;Electrical Stimulation;Functional mobility training;Therapeutic activities;Therapeutic exercise;Neuromuscular re-education;Patient/family education;Manual techniques;Passive range of motion   PT Next Visit Plan Continue with Rt shoulder and neck mobility exercises, shoulder strength, and manual techniques for soft tissue.   Consulted and Agree with Plan of Care Patient      Patient will benefit from skilled therapeutic intervention in order to improve the following deficits and impairments:  Decreased coordination, Decreased strength, Postural dysfunction, Pain, Hypomobility, Decreased range of motion  Visit Diagnosis: Cervicalgia  Other muscle spasm  Unspecified lack of coordination     Problem List Patient Active Problem List   Diagnosis Date Noted  . Genetic testing 10/09/2014  . History of laparoscopic cholecystectomy 08/02/2014  . Psoriasis 10/02/2012  . Dyspnea 07/01/2011  . HYPERLIPIDEMIA 12/23/2008  . MITRAL REGURGITATION 12/20/2008  . Mitral valve disorder 12/20/2008    Carolyn Berger, PTA 05/17/2016, 11:45 AM  [facility] [address], [place], [place], [address] Phone: [phone]   Fax:  [phone]  Name: Carolyn Berger MRN: [id number] Date of Birth: 1945-08-02

## 2016-05-21 ENCOUNTER — Encounter: Payer: Self-pay | Admitting: Physical Therapy

## 2016-05-21 ENCOUNTER — Ambulatory Visit: Payer: Medicare Other | Admitting: Physical Therapy

## 2016-05-21 DIAGNOSIS — M542 Cervicalgia: Secondary | ICD-10-CM | POA: Diagnosis not present

## 2016-05-21 DIAGNOSIS — R279 Unspecified lack of coordination: Secondary | ICD-10-CM

## 2016-05-21 DIAGNOSIS — M62838 Other muscle spasm: Secondary | ICD-10-CM | POA: Diagnosis not present

## 2016-05-21 NOTE — Therapy (Signed)
[facility] [address] [place], [place], [address] Phone: [phone]   Fax:  [phone]  Physical Therapy Treatment  Patient Details  Name: Carolyn Berger MRN: [id number] Date of Birth: 11/20/1945 Referring Provider: Dr. Hoyt Koch  Encounter Date: 05/21/2016      PT End of Session - 05/21/16 1006    Visit Number 9   Number of Visits 10   Date for PT Re-Evaluation 06/10/16   Authorization Type KX modifier needed after visit 15   PT Start Time 1001   PT Stop Time 1046   PT Time Calculation (min) 45 min   Activity Tolerance Patient tolerated treatment well   Behavior During Therapy [facility] for tasks assessed/performed      Past Medical History:  Diagnosis Date  . Allergy    seasonal allergies, presently upper respiratory symptoms"runny nose, post nasal drip, rare cough_tx. Zpack  . Cataract   . GERD (gastroesophageal reflux disease)    controls with diet and rare OTC meds  . Heart murmur   . Mitral valve prolapse    with moderate mitral regurgitation-LOV Dr. Percival Spanish 10-22-13    Past Surgical History:  Procedure Laterality Date  . ANTERIOR CRUCIATE LIGAMENT REPAIR Left   . BREAST BIOPSY     of benign lesions  . CHOLECYSTECTOMY N/A 08/02/2014   Procedure: LAPAROSCOPIC CHOLECYSTECTOMY WITH INTRAOPERATIVE CHOLANGIOGRAM;  Surgeon: Pedro Earls, MD;  Location: [facility];  Service: General;  Laterality: N/A;  . minicus Right   . TEE WITHOUT CARDIOVERSION  07/23/2011   Procedure: TRANSESOPHAGEAL ECHOCARDIOGRAM (TEE);  Surgeon: Loralie Champagne, MD;  Location: [place];  Service: Cardiovascular;  Laterality: N/A;  . TUBAL LIGATION      There were no vitals filed for this visit.      Subjective Assessment - 05/21/16 1016    Currently in Pain? Yes   Pain Score 2    Pain Location Neck   Pain Orientation Right   Pain Descriptors / Indicators Sore   Multiple Pain Sites No                          [facility] Adult PT Treatment/Exercise - 05/21/16 0001      Neck Exercises: Machines for Strengthening   UBE (Upper Arm Bike) L2 4x4      Shoulder Exercises: Supine   Horizontal ABduction Strengthening;Both;20 reps;Theraband  Done on foam roll   Theraband Level (Shoulder Horizontal ABduction) Level 2 (Red)   Horizontal ABduction Weight (lbs) --  Then diagonals bil 10x 2   Other Supine Exercises on foam roll: cane overhead flexion 2x10     Shoulder Exercises: Seated   Other Seated Exercises 1# 3 way rasie 10x each  VC for scap stability     Manual Therapy   Manual Therapy Soft tissue mobilization;Myofascial release   Soft tissue mobilization Cervical RT>LT with static stretching to RT scalenes and levator  PROM bil rotation   Myofascial Release posterior cervical myofascial release                  PT Short Term Goals - 05/17/16 1104      PT SHORT TERM GOAL #3   Title Pt will be able to perform functional squat without hyperactivity of UT muscles in order to do warm ups in karate classes.   Time 4   Period Weeks   Status Achieved           PT  Long Term Goals - 05/17/16 1105      PT LONG TERM GOAL #1   Title Pt will be independent with advanced HEP   Time 8   Period Weeks   Status On-going     PT LONG TERM GOAL #4   Title Pt will be able to return to karate class without increased neck or shoulder pain.   Time 8   Period Weeks   Status On-going               Plan - 05/21/16 1006    Clinical Impression Statement Pt's neck muscularly feels much softer and supple, the joints still feel some restriction with rotation to the RT. Pt can do most of her karate routine at htis time; she did light sparing this week and can do regular pushups.  RT shoulder remains stiff but responds well to stretching exercises.    Rehab Potential Excellent   Clinical Impairments Affecting Rehab Potential none   PT Frequency 2x / week    PT Duration 8 weeks   PT Treatment/Interventions Cryotherapy;Moist Heat;Electrical Stimulation;Functional mobility training;Therapeutic activities;Therapeutic exercise;Neuromuscular re-education;Patient/family education;Manual techniques;Passive range of motion   PT Next Visit Plan Joint mobs for cervical rotation, scapular and shoulder mobilization: pt may need G codes/FOTO next.   Consulted and Agree with Plan of Care --      Patient will benefit from skilled therapeutic intervention in order to improve the following deficits and impairments:  Decreased coordination, Decreased strength, Postural dysfunction, Pain, Hypomobility, Decreased range of motion  Visit Diagnosis: Cervicalgia  Other muscle spasm  Unspecified lack of coordination     Problem List Patient Active Problem List   Diagnosis Date Noted  . Genetic testing 10/09/2014  . History of laparoscopic cholecystectomy 08/02/2014  . Psoriasis 10/02/2012  . Dyspnea 07/01/2011  . HYPERLIPIDEMIA 12/23/2008  . MITRAL REGURGITATION 12/20/2008  . Mitral valve disorder 12/20/2008    Ojas Coone, PTA 05/21/2016, 11:00 AM  [facility] [address], [place], [place], [address] Phone: [phone]   Fax:  [phone]  Name: Kimera Papageorgiou MRN: [id number] Date of Birth: [birth year]

## 2016-05-24 ENCOUNTER — Encounter: Payer: Self-pay | Admitting: Physical Therapy

## 2016-05-24 ENCOUNTER — Ambulatory Visit: Payer: Medicare Other | Admitting: Physical Therapy

## 2016-05-24 DIAGNOSIS — M62838 Other muscle spasm: Secondary | ICD-10-CM

## 2016-05-24 DIAGNOSIS — M542 Cervicalgia: Secondary | ICD-10-CM | POA: Diagnosis not present

## 2016-05-24 DIAGNOSIS — R279 Unspecified lack of coordination: Secondary | ICD-10-CM | POA: Diagnosis not present

## 2016-05-24 NOTE — Therapy (Signed)
[facility] [address] [place], [place], [address] Phone: [phone]   Fax:  [phone]  Physical Therapy Treatment  Patient Details  Name: Carolyn Berger MRN: [id number] Date of Birth: 1945-11-13 Referring Provider: Dr. Hoyt Koch  Encounter Date: 05/24/2016      PT End of Session - 05/24/16 1437    Visit Number 10   Number of Visits 20   Date for PT Re-Evaluation 06/10/16   Authorization Type KX modifier needed after visit 15   PT Start Time 1437   PT Stop Time 1520   PT Time Calculation (min) 43 min   Activity Tolerance Patient tolerated treatment well   Behavior During Therapy [facility] for tasks assessed/performed      Past Medical History:  Diagnosis Date  . Allergy    seasonal allergies, presently upper respiratory symptoms"runny nose, post nasal drip, rare cough_tx. Zpack  . Cataract   . GERD (gastroesophageal reflux disease)    controls with diet and rare OTC meds  . Heart murmur   . Mitral valve prolapse    with moderate mitral regurgitation-LOV Dr. Percival Spanish 10-22-13    Past Surgical History:  Procedure Laterality Date  . ANTERIOR CRUCIATE LIGAMENT REPAIR Left   . BREAST BIOPSY     of benign lesions  . CHOLECYSTECTOMY N/A 08/02/2014   Procedure: LAPAROSCOPIC CHOLECYSTECTOMY WITH INTRAOPERATIVE CHOLANGIOGRAM;  Surgeon: Pedro Earls, MD;  Location: [facility];  Service: General;  Laterality: N/A;  . minicus Right   . TEE WITHOUT CARDIOVERSION  07/23/2011   Procedure: TRANSESOPHAGEAL ECHOCARDIOGRAM (TEE);  Surgeon: Loralie Champagne, MD;  Location: [place];  Service: Cardiovascular;  Laterality: N/A;  . TUBAL LIGATION      There were no vitals filed for this visit.      Subjective Assessment - 05/24/16 1444    Subjective States still having difficulty with the karate class and pain gets worse longer into the class and pain lasts hours afterwards.  Pt is going to class4hr/week verses 6hrs  before.  Practicing for an hours.     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 >60 minutes   Diagnostic tests x-ray, shows narrowing c4/5   Patient Stated Goals be able to go back to karate   Currently in Pain? No/denies            [facility] PT Assessment - 05/24/16 0001      AROM   Cervical Flexion 40   Cervical Extension 80   Cervical - Right Side Bend 10   Cervical - Left Side Bend 20   Cervical - Right Rotation 45   Cervical - Left Rotation 55     Strength   Overall Strength Deficits   Right Hip ABduction 4/5   Right Hip ADduction 4/5   Left Hip ABduction 4/5   Left Hip ADduction 4/5     Palpation   Spinal mobility c3-4, C4-5 hypomobile   Palpation comment tightness located in right scalene, rhomboid,  upper trap;                      [facility] Adult PT Treatment/Exercise - 05/24/16 0001      Neck Exercises: Supine   Neck Retraction 10 reps;5 secs  on foam roller with core bracing   Capital Flexion 20 reps  on foam roll with core  brace     Lumbar Exercises: Supine   Clam 20 reps   Other Supine Lumbar Exercises abdominal brace on foam roll with cervical retraction - shoulder external rotation  red band 20x     Lumbar Exercises: Quadruped   Opposite Arm/Leg Raise Right arm/Left leg;Left arm/Right leg;10 reps  on physioball - cues for head position     Modalities   Modalities Cryotherapy     Cryotherapy   Number Minutes Cryotherapy 15 Minutes  during traction   Cryotherapy Location Cervical   Type of Cryotherapy Ice pack     Traction   Type of Traction Cervical   Min (lbs) 5   Max (lbs) 12   Hold Time 60   Rest Time 20   Time 15     Manual Therapy   Manual Therapy Soft tissue mobilization;Myofascial release   Soft tissue mobilization Cervical RT>LT with static stretching to RT scalenes and levator  PROM bil rotation    Myofascial Release posterior cervical myofascial release                  PT Short Term Goals - 05/24/16 1748      PT SHORT TERM GOAL #1   Title Pt will be independent with initial HEP   Time 4   Period Weeks   Status Achieved     PT SHORT TERM GOAL #2   Title Pt will be able to turn head for looking over her shoulder when driving wihtout increased pain    Time 4   Period Weeks   Status Achieved     PT SHORT TERM GOAL #3   Title Pt will be able to perform functional squat without hyperactivity of UT muscles in order to do warm ups in karate classes.   Time 4   Period Weeks   Status Achieved           PT Long Term Goals - 05/24/16 1438      PT LONG TERM GOAL #1   Title Pt will be independent with advanced HEP   Time 8   Period Weeks   Status On-going     PT LONG TERM GOAL #2   Title FOTO < or += to 35%   Baseline 42% limited up from 51%   Time 8   Period Weeks   Status On-going     PT LONG TERM GOAL #3   Title Pt will demonstrate improved posture with greater shoulder depression and be able to sustain it during reaching forward and overhead activities   Time 8   Period Weeks   Status On-going     PT LONG TERM GOAL #4   Title Pt will be able to return to karate class without increased neck or shoulder pain.   Baseline has increased pain for several hours after class and needs anti-inflammatory medications to manage pain   Time 8   Period Weeks   Status On-going               Plan - 05/24/16 1443    Clinical Impression Statement Pt demonstrates improved cervical AROM and increased shoulder strength.  Her FOTO score has improved significantly, but still shows significant limitations to predicted goal status.  Pt is currently relying heavily on NSAIDs for pain relief.  Pt responded well to cervical traction and manual treatment with reduced pain.  Pt demonstrates hip and core weakness with MMT and overusing upper traps and suboccipitals when  demonstrating her karate  moves such as kicking.  Pt was challenged by exercises on physioball and was able to maintain good neck position with cueing.  At this time, muscle weakness and cervical stiffness is still limiting pt's ability to return fully to her functional activities and healthy lifestyle without flaring up her neck pain.  It is recommended pt continue skilled PT to address core and hip weakness as well as ability to maintain stability and correct posure in cervical spine during functional movements.  PT recommends adding cervical traction to treatment plan as well.   Rehab Potential Excellent   Clinical Impairments Affecting Rehab Potential none   PT Frequency 2x / week   PT Duration 8 weeks   PT Treatment/Interventions Cryotherapy;Moist Heat;Electrical Stimulation;Functional mobility training;Therapeutic activities;Therapeutic exercise;Neuromuscular re-education;Patient/family education;Manual techniques;Passive range of motion;Traction   PT Next Visit Plan cervical traction, Joint mobs for cervical rotation, scapular and shoulder mobilization: pt may need G codes/FOTO next.   PT Home Exercise Plan add hip and core strength as tolerated   Consulted and Agree with Plan of Care Patient      Patient will benefit from skilled therapeutic intervention in order to improve the following deficits and impairments:  Decreased coordination, Decreased strength, Postural dysfunction, Pain, Hypomobility, Decreased range of motion  Visit Diagnosis: Cervicalgia - Plan: PT plan of care cert/re-cert  Other muscle spasm - Plan: PT plan of care cert/re-cert  Unspecified lack of coordination - Plan: PT plan of care cert/re-cert       G-Codes - 06-08-2016 1748    Functional Assessment Tool Used FOTO score is 42% limitation  goal 35%   Functional Limitation Carrying, moving and handling objects   Carrying, Moving and Handling Objects Current Status [id number]) At least 40 percent but less than 60  percent impaired, limited or restricted   Carrying, Moving and Handling Objects Goal Status [id number]) At least 20 percent but less than 40 percent impaired, limited or restricted      Problem List Patient Active Problem List   Diagnosis Date Noted  . Genetic testing 10/09/2014  . History of laparoscopic cholecystectomy 08/02/2014  . Psoriasis 10/02/2012  . Dyspnea 07/01/2011  . HYPERLIPIDEMIA 12/23/2008  . MITRAL REGURGITATION 12/20/2008  . Mitral valve disorder 12/20/2008    Zannie Cove, PT 2016/06/08, 5:57 PM  [facility] [address] [place], [place], [address] Phone: [phone]   Fax:  [phone]  Name: Carolyn Berger MRN: [id number] Date of Birth: 30-May-1946

## 2016-05-26 ENCOUNTER — Encounter: Payer: Self-pay | Admitting: Physical Therapy

## 2016-05-26 ENCOUNTER — Ambulatory Visit: Payer: Medicare Other | Admitting: Physical Therapy

## 2016-05-26 DIAGNOSIS — M542 Cervicalgia: Secondary | ICD-10-CM

## 2016-05-26 DIAGNOSIS — R279 Unspecified lack of coordination: Secondary | ICD-10-CM

## 2016-05-26 DIAGNOSIS — M62838 Other muscle spasm: Secondary | ICD-10-CM

## 2016-05-26 NOTE — Therapy (Signed)
[facility] [address] [place], [place], [address] Phone: [phone]   Fax:  [phone]  Physical Therapy Treatment  Patient Details  Name: Carolyn Berger MRN: [id number] Date of Birth: 21-Aug-1945 Referring Provider: Dr. Hoyt Koch  Encounter Date: 05/26/2016      PT End of Session - 05/26/16 1003    Visit Number 10   Number of Visits 20   Date for PT Re-Evaluation 06/10/16   Authorization Type KX modifier needed after visit 15   PT Start Time 0932   PT Stop Time 1018   PT Time Calculation (min) 46 min   Activity Tolerance Patient tolerated treatment well   Behavior During Therapy [facility] for tasks assessed/performed      Past Medical History:  Diagnosis Date  . Allergy    seasonal allergies, presently upper respiratory symptoms"runny nose, post nasal drip, rare cough_tx. Zpack  . Cataract   . GERD (gastroesophageal reflux disease)    controls with diet and rare OTC meds  . Heart murmur   . Mitral valve prolapse    with moderate mitral regurgitation-LOV Dr. Percival Spanish 10-22-13    Past Surgical History:  Procedure Laterality Date  . ANTERIOR CRUCIATE LIGAMENT REPAIR Left   . BREAST BIOPSY     of benign lesions  . CHOLECYSTECTOMY N/A 08/02/2014   Procedure: LAPAROSCOPIC CHOLECYSTECTOMY WITH INTRAOPERATIVE CHOLANGIOGRAM;  Surgeon: Pedro Earls, MD;  Location: [facility];  Service: General;  Laterality: N/A;  . minicus Right   . TEE WITHOUT CARDIOVERSION  07/23/2011   Procedure: TRANSESOPHAGEAL ECHOCARDIOGRAM (TEE);  Surgeon: Loralie Champagne, MD;  Location: [place];  Service: Cardiovascular;  Laterality: N/A;  . TUBAL LIGATION      There were no vitals filed for this visit.      Subjective Assessment - 05/26/16 0930    Subjective States she is able to do karate but is manageing pain with Advil.  States she is taking Advil an hour before doing a karate.  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 >60 minutes   Diagnostic tests x-ray, shows narrowing c4/5   Patient Stated Goals be able to go back to karate   Currently in Pain? No/denies                         [facility] Adult PT Treatment/Exercise - 05/26/16 0001      Neck Exercises: Supine   Capital Flexion 5 secs;15 reps     Neck Exercises: Prone   Other Prone Exercise on physioball I, Y, T - 10x each     Lumbar Exercises: Quadruped   Single Arm Raise Right;Left;20 reps;1 second   Opposite Arm/Leg Raise Right arm/Left leg;Left arm/Right leg;10 reps  on physioball - cues for head position     Shoulder Exercises: ROM/Strengthening   UBE (Upper Arm Bike) 2 min fwd, 2 min back - L1     Shoulder Exercises: Power [employer] 10 reps  3 sets - 25#, 30#, 30#, sitting on ball   Other Power [employer] Exercises lat pull down - 3x10 - 30#  sitting on ball     Shoulder Exercises: Body Blade   Other Body Blade Exercises 2x15 sec 2 ways arms shoulder height,  up  and down, side to side      Cryotherapy   Number Minutes Cryotherapy 15 Minutes   Cryotherapy Location Cervical   Type of Cryotherapy Ice pack     Traction   Type of Traction Cervical   Min (lbs) 5   Max (lbs) 13   Hold Time 60   Rest Time 20   Time 15                PT Education - 05/26/16 1010    Education provided Yes   Education Details HEP   Person(s) Educated Patient   Methods Explanation;Verbal cues;Handout   Comprehension Verbalized understanding          PT Short Term Goals - 05/24/16 1748      PT SHORT TERM GOAL #1   Title Pt will be independent with initial HEP   Time 4   Period Weeks   Status Achieved     PT SHORT TERM GOAL #2   Title Pt will be able to turn head for looking over her shoulder when driving wihtout increased pain    Time 4   Period  Weeks   Status Achieved     PT SHORT TERM GOAL #3   Title Pt will be able to perform functional squat without hyperactivity of UT muscles in order to do warm ups in karate classes.   Time 4   Period Weeks   Status Achieved           PT Long Term Goals - 05/26/16 0933      PT LONG TERM GOAL #3   Title Pt will demonstrate improved posture with greater shoulder depression and be able to sustain it during reaching forward and overhead activities   Time 8   Period Weeks   Status Achieved               Plan - 05/26/16 1004    Clinical Impression Statement Pt fatigues quickly with anterior flexion exercises performing 5 sec hold.  Pt able to maintain cervical posture with VC throughout.  Skilled PT needed to work on cervical strength especially against gravity due to pt doing karate kicks with trunk leaning in different positions.   Rehab Potential Excellent   Clinical Impairments Affecting Rehab Potential none   PT Frequency 2x / week   PT Duration 8 weeks   PT Treatment/Interventions Cryotherapy;Moist Heat;Electrical Stimulation;Functional mobility training;Therapeutic activities;Therapeutic exercise;Neuromuscular re-education;Patient/family education;Manual techniques;Passive range of motion;Traction   PT Next Visit Plan cervical traction, joint mobs, manual soft tissue release, maintaining cervical position against gravity   PT Home Exercise Plan progress as tolerated   Consulted and Agree with Plan of Care Patient      Patient will benefit from skilled therapeutic intervention in order to improve the following deficits and impairments:  Decreased coordination, Decreased strength, Postural dysfunction, Pain, Hypomobility, Decreased range of motion  Visit Diagnosis: Cervicalgia  Other muscle spasm  Unspecified lack of coordination     Problem List Patient Active Problem List   Diagnosis Date Noted  . Genetic testing 10/09/2014  . History of laparoscopic  cholecystectomy 08/02/2014  . Psoriasis 10/02/2012  . Dyspnea 07/01/2011  . HYPERLIPIDEMIA 12/23/2008  . MITRAL REGURGITATION 12/20/2008  . Mitral valve disorder 12/20/2008    Zannie Cove, PT 05/26/2016, 10:13 AM  [facility] [address] [place], [place], [address] Phone: [phone]   Fax:  [phone]  Name: Carolyn Berger MRN: [id number] Date of Birth:  04/06/1946    

## 2016-05-26 NOTE — Patient Instructions (Signed)
Nod: Cervical Flexion    * Do this in supine, lying on your back: Nod head, tipping chin down. Tighten muscles in the back of throat. Hold 5 sec Do _10__ times, __1_ times per day.  http://ss.exer.us/191   Copyright  VHI. All rights reserved.  Bracing With Arm / Leg Raise (Quadruped)    On hands and knees find neutral spine. Tighten pelvic floor and abdominals and hold. Alternating, lift arm to shoulder level and opposite leg to hip level. Maintain neck posture.  Repeat __10_ times. Do _1_ times a day.   Copyright  VHI. All rights reserved.

## 2016-05-28 DIAGNOSIS — H10022 Other mucopurulent conjunctivitis, left eye: Secondary | ICD-10-CM | POA: Diagnosis not present

## 2016-06-07 HISTORY — PX: OTHER SURGICAL HISTORY: SHX169

## 2016-06-08 ENCOUNTER — Ambulatory Visit: Payer: Medicare Other | Attending: Emergency Medicine | Admitting: Physical Therapy

## 2016-06-08 ENCOUNTER — Encounter: Payer: Self-pay | Admitting: Physical Therapy

## 2016-06-08 DIAGNOSIS — M542 Cervicalgia: Secondary | ICD-10-CM | POA: Diagnosis not present

## 2016-06-08 DIAGNOSIS — M62838 Other muscle spasm: Secondary | ICD-10-CM | POA: Diagnosis not present

## 2016-06-08 DIAGNOSIS — R279 Unspecified lack of coordination: Secondary | ICD-10-CM | POA: Insufficient documentation

## 2016-06-08 NOTE — Therapy (Signed)
[facility] [address] [place], [place], [address] Phone: [phone]   Fax:  [phone]  Physical Therapy Treatment  Patient Details  Name: Carolyn Berger MRN: [id number] Date of Birth: [birth year] Referring Provider: Dr. Hoyt Koch  Encounter Date: 06/08/2016      PT End of Session - 06/08/16 1104    Visit Number 11   Number of Visits 20   Date for PT Re-Evaluation 06/10/16   Authorization Type KX modifier needed after visit 15   PT Start Time 1100   PT Stop Time 1200   PT Time Calculation (min) 60 min   Activity Tolerance Patient tolerated treatment well   Behavior During Therapy [facility] for tasks assessed/performed      Past Medical History:  Diagnosis Date  . Allergy    seasonal allergies, presently upper respiratory symptoms"runny nose, post nasal drip, rare cough_tx. Zpack  . Cataract   . GERD (gastroesophageal reflux disease)    controls with diet and rare OTC meds  . Heart murmur   . Mitral valve prolapse    with moderate mitral regurgitation-LOV Dr. Percival Spanish 10-22-13    Past Surgical History:  Procedure Laterality Date  . ANTERIOR CRUCIATE LIGAMENT REPAIR Left   . BREAST BIOPSY     of benign lesions  . CHOLECYSTECTOMY N/A 08/02/2014   Procedure: LAPAROSCOPIC CHOLECYSTECTOMY WITH INTRAOPERATIVE CHOLANGIOGRAM;  Surgeon: Pedro Earls, MD;  Location: [facility];  Service: General;  Laterality: N/A;  . minicus Right   . TEE WITHOUT CARDIOVERSION  07/23/2011   Procedure: TRANSESOPHAGEAL ECHOCARDIOGRAM (TEE);  Surgeon: Loralie Champagne, MD;  Location: [place];  Service: Cardiovascular;  Laterality: N/A;  . TUBAL LIGATION      There were no vitals filed for this visit.      Subjective Assessment - 06/08/16 1105    Subjective Pt is a little sore today.  States she was not doing any karate.      Limitations Sitting;Lifting   How long can you sit comfortably? >60 minutes   Patient Stated Goals be  able to go back to karate   Currently in Pain? Yes   Pain Score 4    Pain Location Neck   Pain Orientation Right   Pain Descriptors / Indicators Sore   Pain Type Acute pain   Pain Onset 1 to 4 weeks ago   Aggravating Factors  certain movements   Pain Relieving Factors rest, massage   Effect of Pain on Daily Activities karate, chore                         [facility] Adult PT Treatment/Exercise - 06/08/16 0001      Neck Exercises: Supine   Neck Retraction 10 reps;5 secs  on foam roller with core bracing   Capital Flexion 20 reps;3 secs     Neck Exercises: Prone   Other Prone Exercise on physioball I, T - 15x each     Lumbar Exercises: Quadruped   Single Arm Raise Right;Left;1 second;10 reps     Shoulder Exercises: Power Tower   Extension 20 reps  20# - VC to keep shoulders down and back     Traction   Type of Traction Cervical   Min (lbs) 5   Max (lbs) 15   Hold Time 60   Rest Time 20   Time 15     Manual Therapy   Joint Mobilization C3-6 opening and closing mobs grade II  Soft tissue mobilization Cervical scalenes, suboccipitals RT>LT with static stretching to RT scalenes and levator   Myofascial Release posterior cervical myofascial release                  PT Short Term Goals - 05/24/16 1748      PT SHORT TERM GOAL #1   Title Pt will be independent with initial HEP   Time 4   Period Weeks   Status Achieved     PT SHORT TERM GOAL #2   Title Pt will be able to turn head for looking over her shoulder when driving wihtout increased pain    Time 4   Period Weeks   Status Achieved     PT SHORT TERM GOAL #3   Title Pt will be able to perform functional squat without hyperactivity of UT muscles in order to do warm ups in karate classes.   Time 4   Period Weeks   Status Achieved           PT Long Term Goals - 06/08/16 1149      PT LONG TERM GOAL #1   Title Pt will be independent with advanced HEP   Baseline reinforced stretches  and to focus on awareness of upper traps during functional movements   Period Weeks   Status On-going               Plan - 06/08/16 1116    Clinical Impression Statement Pt needs cues to be mindful of cervical position during exercises.  Will benefit from skilled PT for increased postural strength and awareness.   Rehab Potential Excellent   Clinical Impairments Affecting Rehab Potential none   PT Frequency 2x / week   PT Duration 8 weeks   PT Treatment/Interventions Cryotherapy;Moist Heat;Electrical Stimulation;Functional mobility training;Therapeutic activities;Therapeutic exercise;Neuromuscular re-education;Patient/family education;Manual techniques;Passive range of motion;Traction   PT Next Visit Plan cervical traction, joint mobs, manual soft tissue release, maintaining cervical position against gravity   PT Home Exercise Plan progress as tolerated   Consulted and Agree with Plan of Care Patient      Patient will benefit from skilled therapeutic intervention in order to improve the following deficits and impairments:  Decreased coordination, Decreased strength, Postural dysfunction, Pain, Hypomobility, Decreased range of motion  Visit Diagnosis: Cervicalgia  Other muscle spasm  Unspecified lack of coordination     Problem List Patient Active Problem List   Diagnosis Date Noted  . Genetic testing 10/09/2014  . History of laparoscopic cholecystectomy 08/02/2014  . Psoriasis 10/02/2012  . Dyspnea 07/01/2011  . HYPERLIPIDEMIA 12/23/2008  . MITRAL REGURGITATION 12/20/2008  . Mitral valve disorder 12/20/2008    Zannie Cove, PT 06/08/2016, 11:54 AM  [facility] [address], [place], [place], [address] Phone: [phone]   Fax:  [phone]  Name: Carolyn Berger MRN: [id number] Date of Birth: April 17, 1946

## 2016-06-09 DIAGNOSIS — E784 Other hyperlipidemia: Secondary | ICD-10-CM | POA: Diagnosis not present

## 2016-06-09 DIAGNOSIS — M545 Low back pain: Secondary | ICD-10-CM | POA: Diagnosis not present

## 2016-06-09 DIAGNOSIS — M542 Cervicalgia: Secondary | ICD-10-CM | POA: Diagnosis not present

## 2016-06-09 DIAGNOSIS — Z6822 Body mass index (BMI) 22.0-22.9, adult: Secondary | ICD-10-CM | POA: Diagnosis not present

## 2016-06-09 DIAGNOSIS — R197 Diarrhea, unspecified: Secondary | ICD-10-CM | POA: Diagnosis not present

## 2016-06-09 DIAGNOSIS — K219 Gastro-esophageal reflux disease without esophagitis: Secondary | ICD-10-CM | POA: Diagnosis not present

## 2016-06-09 DIAGNOSIS — Z1389 Encounter for screening for other disorder: Secondary | ICD-10-CM | POA: Diagnosis not present

## 2016-06-09 DIAGNOSIS — Z23 Encounter for immunization: Secondary | ICD-10-CM | POA: Diagnosis not present

## 2016-06-09 DIAGNOSIS — I10 Essential (primary) hypertension: Secondary | ICD-10-CM | POA: Diagnosis not present

## 2016-06-09 DIAGNOSIS — J3089 Other allergic rhinitis: Secondary | ICD-10-CM | POA: Diagnosis not present

## 2016-06-10 ENCOUNTER — Encounter: Payer: Self-pay | Admitting: Physical Therapy

## 2016-06-10 ENCOUNTER — Ambulatory Visit: Payer: Medicare Other | Admitting: Physical Therapy

## 2016-06-10 DIAGNOSIS — M542 Cervicalgia: Secondary | ICD-10-CM | POA: Diagnosis not present

## 2016-06-10 DIAGNOSIS — E784 Other hyperlipidemia: Secondary | ICD-10-CM | POA: Diagnosis not present

## 2016-06-10 DIAGNOSIS — R279 Unspecified lack of coordination: Secondary | ICD-10-CM

## 2016-06-10 DIAGNOSIS — M62838 Other muscle spasm: Secondary | ICD-10-CM

## 2016-06-10 DIAGNOSIS — R8299 Other abnormal findings in urine: Secondary | ICD-10-CM | POA: Diagnosis not present

## 2016-06-10 DIAGNOSIS — I10 Essential (primary) hypertension: Secondary | ICD-10-CM | POA: Diagnosis not present

## 2016-06-10 DIAGNOSIS — M859 Disorder of bone density and structure, unspecified: Secondary | ICD-10-CM | POA: Diagnosis not present

## 2016-06-10 NOTE — Therapy (Signed)
[facility] [address], [place], [place], [address] Phone: [phone]   Fax:  [phone]  Physical Therapy Treatment  Patient Details  Name: Peg Salminen MRN: [id number] Date of Birth: [birth year] Referring Provider: Dr. Hoyt Koch  Encounter Date: 06/10/2016      PT End of Session - 06/10/16 1312    Visit Number 12   Number of Visits 20   Date for PT Re-Evaluation 08/05/16   Authorization Type KX modifier needed after visit 26   PT Start Time 1100   PT Stop Time 1200   PT Time Calculation (min) 60 min   Activity Tolerance Patient tolerated treatment well   Behavior During Therapy [facility] for tasks assessed/performed      Past Medical History:  Diagnosis Date  . Allergy    seasonal allergies, presently upper respiratory symptoms"runny nose, post nasal drip, rare cough_tx. Zpack  . Cataract   . GERD (gastroesophageal reflux disease)    controls with diet and rare OTC meds  . Heart murmur   . Mitral valve prolapse    with moderate mitral regurgitation-LOV Dr. Percival Spanish 10-22-13    Past Surgical History:  Procedure Laterality Date  . ANTERIOR CRUCIATE LIGAMENT REPAIR Left   . BREAST BIOPSY     of benign lesions  . CHOLECYSTECTOMY N/A 08/02/2014   Procedure: LAPAROSCOPIC CHOLECYSTECTOMY WITH INTRAOPERATIVE CHOLANGIOGRAM;  Surgeon: Pedro Earls, MD;  Location: [facility];  Service: General;  Laterality: N/A;  . minicus Right   . TEE WITHOUT CARDIOVERSION  07/23/2011   Procedure: TRANSESOPHAGEAL ECHOCARDIOGRAM (TEE);  Surgeon: Loralie Champagne, MD;  Location: [place];  Service: Cardiovascular;  Laterality: N/A;  . TUBAL LIGATION      There were no vitals filed for this visit.      Subjective Assessment - 06/10/16 1129    Subjective Pt states she is still about the same, still has difficulty making it through a full work out.  She is not able to take Advil anymore due to high blood pressure.   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 >60 minutes   Diagnostic tests x-ray, shows narrowing c4/5   Patient Stated Goals return to karate and able to work on computer without increased pain   Currently in Pain? Yes   Pain Score 5    Pain Location Neck   Pain Orientation Right   Pain Descriptors / Indicators Sore   Pain Type Chronic pain   Pain Radiating Towards Rt shoulder blade   Pain Onset More than a month ago   Aggravating Factors  kicking in karate, sitting at the computer   Pain Relieving Factors rest, massage   Effect of Pain on Daily Activities karate and working on computer   Multiple Pain Sites No            [facility] PT Assessment - 06/10/16 0001      AROM   Cervical Flexion 40   Cervical Extension 80   Cervical - Right Side Bend 10   Cervical - Left Side Bend 20   Cervical - Right Rotation 45   Cervical - Left Rotation 55     Strength   Overall Strength Deficits   Right Hip ABduction 4/5   Right Hip ADduction 4/5   Left Hip ABduction 4/5   Left Hip ADduction 4/5  Palpation   Spinal mobility c3-4, C4-5 hypomobile   Palpation comment tightness located in right scalene, rhomboid,  upper trap;                      [facility] Adult PT Treatment/Exercise - 06/10/16 0001      Therapeutic Activites    Therapeutic Activities Other Therapeutic Activities   Other Therapeutic Activities educated on sitting posture with supported arms     Neck Exercises: Machines for Strengthening   UBE (Upper Arm Bike) L1 3x3     Neck Exercises: Supine   Neck Retraction 10 reps;5 secs   Capital Flexion 20 reps;3 secs     Neck Exercises: Prone   Other Prone Exercise on physioball alt UE 5x each     Lumbar Exercises: Supine   Other Supine Lumbar Exercises abdominal brace on foam roll with cervical retraction - alternating shoulder flexion and UE/LE 20x      Lumbar Exercises: Quadruped   Single Arm Raise Right;Left;1 second;10 reps   Opposite Arm/Leg Raise Right arm/Left leg;Left arm/Right leg;10 reps  on physioball - cues for head position     Shoulder Exercises: Prone   Retraction Strengthening;Right;15 reps;Weights   Retraction Weight (lbs) 3   Flexion Strengthening;Right;10 reps   Extension Strengthening;Right;15 reps;Weights   Extension Weight (lbs) 3   Horizontal ABduction 1 Strengthening;Right;15 reps;Weights   Horizontal ABduction 1 Weight (lbs) 2     Traction   Type of Traction Cervical   Min (lbs) 5   Max (lbs) 17   Hold Time 60   Rest Time 20   Time 15     Manual Therapy   Joint Mobilization C3-6 opening and closing mobs grade II   Soft tissue mobilization Cervical scalenes, suboccipitals RT>LT with static stretching to RT scalenes and levator   Myofascial Release posterior cervical myofascial release                PT Education - 06/10/16 1320    Education provided Yes   Education Details educated on posture at desk and arm position   Person(s) Educated Patient   Methods Explanation   Comprehension Verbalized understanding          PT Short Term Goals - 06/10/16 1108      PT SHORT TERM GOAL #1   Title Pt will be independent with initial HEP   Time 4   Period Weeks   Status Achieved     PT SHORT TERM GOAL #2   Title Pt will be able to turn head for looking over her shoulder when driving wihtout increased pain    Time 4   Period Weeks   Status Achieved     PT SHORT TERM GOAL #3   Title Pt will be able to perform functional squat without hyperactivity of UT muscles in order to do warm ups in karate classes.   Time 4   Period Weeks   Status Achieved           PT Long Term Goals - 06/10/16 1109      PT LONG TERM GOAL #1   Title Pt will be independent with advanced HEP   Baseline reinforced stretches and to focus on awareness of upper traps during functional movements   Time 8    Period Weeks   Status On-going     PT LONG TERM GOAL #2   Title FOTO < or += to 35%   Baseline 42% limited up from  51%   Time 8   Period Weeks   Status On-going     PT LONG TERM GOAL #3   Title Pt will demonstrate improved posture with greater shoulder depression and be able to sustain it during reaching forward and overhead activities   Time 8   Period Weeks   Status Achieved     PT LONG TERM GOAL #4   Title Pt will be able to return to karate class without increased neck or shoulder pain.   Baseline usually does 6 hours of practice and 6 hours of classes - currenlty able to do about half of that   Time 8   Period Weeks   Status On-going     PT LONG TERM GOAL #5   Title Sit at the computer for an hour and take breaks to manage pain and not have increased pain with 5-6 hours a day of computer work               Plan - 06/21/16 1314    Clinical Impression Statement Pt continues to have muscle spasms in suboccipitals, scalenes, cervical paraspinals, and upper traps, along with overutilization of those muscles when lifting and doing things that challenge the core such as kicking in karate.  Pt continues to have poor posture when sitting at her desk while working on the computer that is also compromising her spine.  Continues to need skilled PT to release soft tissue and for strength and endurance of postural muscles for all activities  Pt recommended to try dry needling and pt is in agreement with this plan.  Pt recommended to extend plan of care until 08/05/16 to continue to address remaining impairments   Rehab Potential Excellent   PT Frequency 2x / week   PT Duration 8 weeks   PT Treatment/Interventions Cryotherapy;Moist Heat;Electrical Stimulation;Functional mobility training;Therapeutic activities;Therapeutic exercise;Neuromuscular re-education;Patient/family education;Manual techniques;Passive range of motion;Traction;Dry needling   PT Next Visit Plan dry needling if  appropriate, continue cervical traction and manual as needed, review posture at desk   PT [place] progress as tolerated   Recommended Other Services none   Consulted and Agree with Plan of Care Patient      Patient will benefit from skilled therapeutic intervention in order to improve the following deficits and impairments:  Decreased coordination, Decreased strength, Postural dysfunction, Pain, Hypomobility, Decreased range of motion  Visit Diagnosis: Cervicalgia - Plan: PT plan of care cert/re-cert  Other muscle spasm - Plan: PT plan of care cert/re-cert  Unspecified lack of coordination - Plan: PT plan of care cert/re-cert       G-Codes - 06/21/2016 1332    Functional Assessment Tool Used FOTO score is 42% limitation   Functional Limitation Carrying, moving and handling objects   Carrying, Moving and Handling Objects Current Status [id number]) At least 40 percent but less than 60 percent impaired, limited or restricted   Carrying, Moving and Handling Objects Goal Status [id number]) At least 20 percent but less than 40 percent impaired, limited or restricted      Problem List Patient Active Problem List   Diagnosis Date Noted  . Genetic testing 10/09/2014  . History of laparoscopic cholecystectomy 08/02/2014  . Psoriasis 10/02/2012  . Dyspnea 07/01/2011  . HYPERLIPIDEMIA 12/23/2008  . MITRAL REGURGITATION 12/20/2008  . Mitral valve disorder 12/20/2008    Zannie Cove, PT 06-21-2016, 1:40 PM  [facility] [address], [place], [place], [address] Phone: [phone]   Fax:  [phone]  Name: Ebonee Oleary MRN: [id number] Date of Birth: [birth year]

## 2016-06-14 ENCOUNTER — Ambulatory Visit: Payer: Medicare Other

## 2016-06-14 DIAGNOSIS — M62838 Other muscle spasm: Secondary | ICD-10-CM | POA: Diagnosis not present

## 2016-06-14 DIAGNOSIS — M542 Cervicalgia: Secondary | ICD-10-CM

## 2016-06-14 DIAGNOSIS — R279 Unspecified lack of coordination: Secondary | ICD-10-CM | POA: Diagnosis not present

## 2016-06-14 NOTE — Patient Instructions (Signed)

## 2016-06-14 NOTE — Therapy (Signed)
[facility] [address] [place], [place], [address] Phone: [phone]   Fax:  [phone]  Physical Therapy Treatment  Patient Details  Name: Carolyn Berger MRN: [id number] Date of Birth: 08/06/1945 Referring Provider: Dr. Hoyt Koch  Encounter Date: 06/14/2016      PT End of Session - 06/14/16 1606    Visit Number 13   Number of Visits 20   Date for PT Re-Evaluation 08/05/16   PT Start Time 1532  dry needling   PT Stop Time [id number]   PT Time Calculation (min) 45 min   Activity Tolerance Patient tolerated treatment well   Behavior During Therapy [facility] for tasks assessed/performed      Past Medical History:  Diagnosis Date  . Allergy    seasonal allergies, presently upper respiratory symptoms"runny nose, post nasal drip, rare cough_tx. Zpack  . Cataract   . GERD (gastroesophageal reflux disease)    controls with diet and rare OTC meds  . Heart murmur   . Mitral valve prolapse    with moderate mitral regurgitation-LOV Dr. Percival Spanish 10-22-13    Past Surgical History:  Procedure Laterality Date  . ANTERIOR CRUCIATE LIGAMENT REPAIR Left   . BREAST BIOPSY     of benign lesions  . CHOLECYSTECTOMY N/A 08/02/2014   Procedure: LAPAROSCOPIC CHOLECYSTECTOMY WITH INTRAOPERATIVE CHOLANGIOGRAM;  Surgeon: Pedro Earls, MD;  [facility];  Service: General;  Laterality: N/A;  . minicus Right   . TEE WITHOUT CARDIOVERSION  07/23/2011   Procedure: TRANSESOPHAGEAL ECHOCARDIOGRAM (TEE);  Surgeon: Loralie Champagne, MD;  Location: [place];  Service: Cardiovascular;  Laterality: N/A;  . TUBAL LIGATION      There were no vitals filed for this visit.      Subjective Assessment - 06/14/16 1534    Subjective Feeling about the same.  Ready to dry needling.   Currently in Pain? Yes   Pain Score 3    Pain Location Neck   Pain Orientation Right   Pain Descriptors / Indicators Sore   Pain Type Chronic pain   Pain Onset  More than a month ago   Pain Frequency Constant   Aggravating Factors  sitting at computer, karate workouts   Pain Relieving Factors rest, massage                         [facility] Adult PT Treatment/Exercise - 06/14/16 0001      Traction   Type of Traction Cervical   Min (lbs) 5   Max (lbs) 15   Hold Time 60   Rest Time 10   Time 15     Manual Therapy   Manual Therapy Soft tissue mobilization;Myofascial release   Manual therapy comments soft tissue elongation and tirgger point release to bil. upper traps with pt supine          Trigger Point Dry Needling - 06/14/16 1539    Consent Given? Yes   Education Handout Provided Yes   Muscles Treated Upper Body Upper trapezius   Upper Trapezius Response Twitch reponse elicited;Palpable increased muscle length   SubOccipitals Response --              PT Education - 06/14/16 1531    Education provided Yes   Education Details DN info   Person(s) Educated Patient   Methods Explanation;Handout   Comprehension Verbalized understanding          PT Short Term Goals - 06/10/16 1108  PT SHORT TERM GOAL #1   Title Pt will be independent with initial HEP   Time 4   Period Weeks   Status Achieved     PT SHORT TERM GOAL #2   Title Pt will be able to turn head for looking over her shoulder when driving wihtout increased pain    Time 4   Period Weeks   Status Achieved     PT SHORT TERM GOAL #3   Title Pt will be able to perform functional squat without hyperactivity of UT muscles in order to do warm ups in karate classes.   Time 4   Period Weeks   Status Achieved           PT Long Term Goals - 06/10/16 1109      PT LONG TERM GOAL #1   Title Pt will be independent with advanced HEP   Baseline reinforced stretches and to focus on awareness of upper traps during functional movements   Time 8   Period Weeks   Status On-going     PT LONG TERM GOAL #2   Title FOTO < or += to 35%   Baseline 42%  limited up from 51%   Time 8   Period Weeks   Status On-going     PT LONG TERM GOAL #3   Title Pt will demonstrate improved posture with greater shoulder depression and be able to sustain it during reaching forward and overhead activities   Time 8   Period Weeks   Status Achieved     PT LONG TERM GOAL #4   Title Pt will be able to return to karate class without increased neck or shoulder pain.   Baseline usually does 6 hours of practice and 6 hours of classes - currenlty able to do about half of that   Time 8   Period Weeks   Status On-going     PT LONG TERM GOAL #5   Title Sit at the computer for an hour and take breaks to manage pain and not have increased pain with 5-6 hours a day of computer work               Plan - 06/14/16 1604    Clinical Impression Statement Pt with active trigger points in bil. upper traps today.  Pt tolerated dry needling well followed by traction . Pt will continue to exercises for flexibility and strength.  Pt will continue to benefit from skilled PT for manual, flexibility and strength to reduce muscle tension and pain with activity.     Rehab Potential Excellent   PT Frequency 2x / week   PT Duration 8 weeks   PT Treatment/Interventions Cryotherapy;Moist Heat;Electrical Stimulation;Functional mobility training;Therapeutic activities;Therapeutic exercise;Neuromuscular re-education;Patient/family education;Manual techniques;Passive range of motion;Traction;Dry needling   PT Next Visit Plan assess response to dry needling, continue cervical traction and manual as needed, review posture at desk   Consulted and Agree with Plan of Care Patient      Patient will benefit from skilled therapeutic intervention in order to improve the following deficits and impairments:  Decreased coordination, Decreased strength, Postural dysfunction, Pain, Hypomobility, Decreased range of motion  Visit Diagnosis: Cervicalgia  Other muscle spasm     Problem  List Patient Active Problem List   Diagnosis Date Noted  . Genetic testing 10/09/2014  . History of laparoscopic cholecystectomy 08/02/2014  . Psoriasis 10/02/2012  . Dyspnea 07/01/2011  . HYPERLIPIDEMIA 12/23/2008  . MITRAL REGURGITATION 12/20/2008  . Mitral valve  disorder 12/20/2008     Sigurd Sos, PT 06/14/16 4:08 PM  [facility] [address], [place], [place], [address] Phone: [phone]   Fax:  [phone]  Name: Carolyn Berger MRN: [id number] Date of Birth: 12-19-1945

## 2016-06-16 ENCOUNTER — Ambulatory Visit: Payer: Medicare Other | Admitting: Physical Therapy

## 2016-06-16 ENCOUNTER — Encounter: Payer: Self-pay | Admitting: Physical Therapy

## 2016-06-16 DIAGNOSIS — M542 Cervicalgia: Secondary | ICD-10-CM | POA: Diagnosis not present

## 2016-06-16 DIAGNOSIS — R279 Unspecified lack of coordination: Secondary | ICD-10-CM

## 2016-06-16 DIAGNOSIS — M62838 Other muscle spasm: Secondary | ICD-10-CM | POA: Diagnosis not present

## 2016-06-16 NOTE — Therapy (Signed)
[facility] [address] [place], [place], [address] Phone: [phone]   Fax:  [phone]  Physical Therapy Treatment  Patient Details  Name: Carolyn Berger MRN: [id number] Date of Birth: June 19, 1945 Referring Provider: Dr. Hoyt Koch  Encounter Date: 06/16/2016      PT End of Session - 06/16/16 0924    Visit Number 14   Number of Visits 20   Date for PT Re-Evaluation 08/05/16   Authorization Type gcodes at 20th visit   PT Start Time 0924   PT Stop Time 1014   PT Time Calculation (min) 50 min   Activity Tolerance Patient tolerated treatment well   Behavior During Therapy [facility] for tasks assessed/performed      Past Medical History:  Diagnosis Date  . Allergy    seasonal allergies, presently upper respiratory symptoms"runny nose, post nasal drip, rare cough_tx. Zpack  . Cataract   . GERD (gastroesophageal reflux disease)    controls with diet and rare OTC meds  . Heart murmur   . Mitral valve prolapse    with moderate mitral regurgitation-LOV Dr. Percival Spanish 10-22-13    Past Surgical History:  Procedure Laterality Date  . ANTERIOR CRUCIATE LIGAMENT REPAIR Left   . BREAST BIOPSY     of benign lesions  . CHOLECYSTECTOMY N/A 08/02/2014   Procedure: LAPAROSCOPIC CHOLECYSTECTOMY WITH INTRAOPERATIVE CHOLANGIOGRAM;  Surgeon: Pedro Earls, MD;  [facility];  Service: General;  Laterality: N/A;  . minicus Right   . TEE WITHOUT CARDIOVERSION  07/23/2011   Procedure: TRANSESOPHAGEAL ECHOCARDIOGRAM (TEE);  Surgeon: Loralie Champagne, MD;  Location: [place];  Service: Cardiovascular;  Laterality: N/A;  . TUBAL LIGATION      There were no vitals filed for this visit.      Subjective Assessment - 06/16/16 0929    Subjective No pain right now, still having pain during karate and sittin at the computer   Currently in Pain? No/denies                         [facility] Adult PT Treatment/Exercise  - 06/16/16 0001      Neck Exercises: Seated   Other Seated Exercise upper trap stretch, scalene stretch -  3x20 sec   Other Seated Exercise thoracic extension in chair with neck alignment 10x, diaphragmatic breathing with postural alignment  VC and tactile cues to perform correctly     Neck Exercises: Supine   Cervical Isometrics Flexion;3 secs;10 reps   Neck Retraction 10 reps;5 secs   Capital Flexion 10 reps;5 secs     Shoulder Exercises: ROM/Strengthening   UBE (Upper Arm Bike) 3 min fwd, 3 min back - L1     Manual Therapy   Manual Therapy Soft tissue mobilization;Myofascial release   Soft tissue mobilization Cervical scalenes, suboccipitals RT>LT with static stretching to RT scalenes and levator, Rt pecs and upper traps   Myofascial Release posterior cervical myofascial release                  PT Short Term Goals - 06/10/16 1108      PT SHORT TERM GOAL #1   Title Pt will be independent with initial HEP   Time 4   Period Weeks   Status Achieved     PT SHORT TERM GOAL #2   Title Pt will be able to turn head for looking over her shoulder when driving wihtout increased pain    Time 4  Period Weeks   Status Achieved     PT SHORT TERM GOAL #3   Title Pt will be able to perform functional squat without hyperactivity of UT muscles in order to do warm ups in karate classes.   Time 4   Period Weeks   Status Achieved           PT Long Term Goals - 06/16/16 [id number]      PT LONG TERM GOAL #5   Title Sit at the computer for an hour and take breaks to manage pain and not have increased pain with 5-6 hours a day of computer work   Time 8   Period Weeks   Status New               Plan - 06/16/16 1017    Clinical Impression Statement Pt demonstrates a lot of cocontraction with doing cervical retractions and needs VC and tactile feedback.  Provided education on thoracic extension in sitting and diaphragmatic breathing for increased ribcage excursion and  spine lengthening while sitting at dest throughout the day. Continues to need skilled PT for improved posture and strength for decreased pain during work and activities.   Rehab Potential Excellent   Clinical Impairments Affecting Rehab Potential none   PT Frequency 2x / week   PT Duration 8 weeks   PT Treatment/Interventions Cryotherapy;Moist Heat;Electrical Stimulation;Functional mobility training;Therapeutic activities;Therapeutic exercise;Neuromuscular re-education;Patient/family education;Manual techniques;Passive range of motion;Traction;Dry needling   PT Next Visit Plan continue neck stability and manual, f/u on thoracic extension and breathing   PT Home Exercise Plan add scalene stretch and thoracic extension in chair   Consulted and Agree with Plan of Care Patient      Patient will benefit from skilled therapeutic intervention in order to improve the following deficits and impairments:  Decreased coordination, Decreased strength, Postural dysfunction, Pain, Hypomobility, Decreased range of motion  Visit Diagnosis: Cervicalgia  Other muscle spasm  Unspecified lack of coordination     Problem List Patient Active Problem List   Diagnosis Date Noted  . Genetic testing 10/09/2014  . History of laparoscopic cholecystectomy 08/02/2014  . Psoriasis 10/02/2012  . Dyspnea 07/01/2011  . HYPERLIPIDEMIA 12/23/2008  . MITRAL REGURGITATION 12/20/2008  . Mitral valve disorder 12/20/2008    Zannie Cove, PT 06/16/2016, 11:19 AM  [facility] [address] [place], [place], [address] Phone: [phone]   Fax:  [phone]  Name: Carolyn Berger MRN: [id number] Date of Birth: 1946-02-25

## 2016-06-23 ENCOUNTER — Encounter: Payer: Self-pay | Admitting: Physical Therapy

## 2016-06-24 ENCOUNTER — Encounter: Payer: Self-pay | Admitting: Physical Therapy

## 2016-06-25 ENCOUNTER — Ambulatory Visit: Payer: Medicare Other | Admitting: Physical Therapy

## 2016-06-25 DIAGNOSIS — M62838 Other muscle spasm: Secondary | ICD-10-CM

## 2016-06-25 DIAGNOSIS — M542 Cervicalgia: Secondary | ICD-10-CM

## 2016-06-25 DIAGNOSIS — R279 Unspecified lack of coordination: Secondary | ICD-10-CM | POA: Diagnosis not present

## 2016-06-25 NOTE — Therapy (Signed)
[facility] [address], [place], [place], [address] Phone: [phone]   Fax:  [phone]  Physical Therapy Treatment  Patient Details  Name: Carolyn Berger MRN: [id number] Date of Birth: [birth year] Referring Provider: Dr. Hoyt Koch  Encounter Date: 06/25/2016      PT End of Session - 06/25/16 1012    Visit Number 15   Number of Visits 20   Date for PT Re-Evaluation 08/05/16   Authorization Type gcodes at 20th visit   PT Start Time 0930   PT Stop Time 1020   PT Time Calculation (min) 50 min   Activity Tolerance Patient tolerated treatment well      Past Medical History:  Diagnosis Date  . Allergy    seasonal allergies, presently upper respiratory symptoms"runny nose, post nasal drip, rare cough_tx. Zpack  . Cataract   . GERD (gastroesophageal reflux disease)    controls with diet and rare OTC meds  . Heart murmur   . Mitral valve prolapse    with moderate mitral regurgitation-LOV Dr. Percival Spanish 10-22-13    Past Surgical History:  Procedure Laterality Date  . ANTERIOR CRUCIATE LIGAMENT REPAIR Left   . BREAST BIOPSY     of benign lesions  . CHOLECYSTECTOMY N/A 08/02/2014   Procedure: LAPAROSCOPIC CHOLECYSTECTOMY WITH INTRAOPERATIVE CHOLANGIOGRAM;  Surgeon: Pedro Earls, MD;  [facility];  Service: General;  Laterality: N/A;  . minicus Right   . TEE WITHOUT CARDIOVERSION  07/23/2011   Procedure: TRANSESOPHAGEAL ECHOCARDIOGRAM (TEE);  Surgeon: Loralie Champagne, MD;  Location: [place];  Service: Cardiovascular;  Laterality: N/A;  . TUBAL LIGATION      There were no vitals filed for this visit.      Subjective Assessment - 06/25/16 0933    Subjective Feeling about the same.  Did a karate workout before I came and it was hurting then.  Hurts when I turn my neck.  Right neck and shoulder, shoulder blade and down right arm to the elbow.   After previous dry needling, it hurt for 12 hours then it  felt better.     Currently in Pain? No/denies   Pain Score 0-No pain   Pain Frequency Intermittent   Aggravating Factors  karate, turning my head, karate                         [facility] Adult PT Treatment/Exercise - 06/25/16 0001      Shoulder Exercises: ROM/Strengthening   UBE (Upper Arm Bike) 3 min fwd, 3 min back - L1   Other ROM/Strengthening Exercises seated thoracic extension over ball 15x     Moist Heat Therapy   Number Minutes Moist Heat 15 Minutes   Moist Heat Location Cervical     Electrical Stimulation   Electrical Stimulation Location cervical   Electrical Stimulation Action IFC   Electrical Stimulation Parameters 7 ma 15 min supine   Electrical Stimulation Goals Pain     Manual Therapy   Manual Therapy Joint mobilization;Soft tissue mobilization;Manual Traction   Joint Mobilization C4-7 P-A, left lateral glides, right and left mob with movement 3x  each   Soft tissue mobilization right upper trap, rhomboids, infraspinatus, levator scap, suboccipitals   Manual Traction 3x 30 sec          Trigger Point Dry Needling - 06/25/16 1008    Consent Given? Yes   Education Handout Provided Yes   Muscles Treated Upper Body Upper trapezius;Levator scapulae;Infraspinatus  right cervical multifidi   Upper Trapezius Response Twitch reponse elicited;Palpable increased muscle length   Levator Scapulae Response Palpable increased muscle length   Infraspinatus Response Palpable increased muscle length     Right side only.           PT Short Term Goals - 06/25/16 1026      PT SHORT TERM GOAL #1   Title Pt will be independent with initial HEP   Status Achieved     PT SHORT TERM GOAL #2   Title Pt will be able to turn head for looking over her shoulder when driving wihtout increased pain    Status Achieved     PT SHORT TERM GOAL #3   Title Pt will be able to perform functional squat without hyperactivity of UT muscles in order to do warm ups in  karate classes.   Status Achieved           PT Long Term Goals - 06/25/16 1026      PT LONG TERM GOAL #1   Title Pt will be independent with advanced HEP   Time 8   Period Weeks   Status On-going     PT LONG TERM GOAL #2   Title FOTO < or += to 35%   Time 8   Period Weeks   Status On-going     PT LONG TERM GOAL #3   Title Pt will demonstrate improved posture with greater shoulder depression and be able to sustain it during reaching forward and overhead activities   Status Achieved     PT LONG TERM GOAL #4   Title Pt will be able to return to karate class without increased neck or shoulder pain.   Time 8   Period Weeks   Status On-going     PT LONG TERM GOAL #5   Title Sit at the computer for an hour and take breaks to manage pain and not have increased pain with 5-6 hours a day of computer work   Time 8   Period Weeks   Status On-going               Plan - 06/25/16 1012    Clinical Impression Statement The patient reports continued right neck pain with turning her head right and left, with karate and sitting at the computer.  Improved soft tissue length following dry needling and manual therapy.  Improved thoracic extension noted when cervical mobility blocked.   Good pain relief with e-stim/heat.  Therapist closely monitoring response with all treatment interventions.      PT Next Visit Plan assess response to DN #2;  thoracic extension;  neck stabilization; manual therapy      Patient will benefit from skilled therapeutic intervention in order to improve the following deficits and impairments:     Visit Diagnosis: Cervicalgia  Other muscle spasm  Unspecified lack of coordination     Problem List Patient Active Problem List   Diagnosis Date Noted  . Genetic testing 10/09/2014  . History of laparoscopic cholecystectomy 08/02/2014  . Psoriasis 10/02/2012  . Dyspnea 07/01/2011  . HYPERLIPIDEMIA 12/23/2008  . MITRAL REGURGITATION 12/20/2008  .  Mitral valve disorder 12/20/2008   Ruben Im, PT 06/25/16 10:28 AM Phone: [phone] Fax: [phone]  Alvera Singh 06/25/2016, 10:28 AM  [facility] [address], [place], [place], [address] Phone: [phone]   Fax:  [phone]  Name: Danilee Levie MRN: [id number] Date of Birth: 12-30-1945

## 2016-06-28 ENCOUNTER — Other Ambulatory Visit: Payer: Self-pay | Admitting: Internal Medicine

## 2016-06-28 ENCOUNTER — Ambulatory Visit: Payer: Medicare Other

## 2016-06-28 DIAGNOSIS — M62838 Other muscle spasm: Secondary | ICD-10-CM

## 2016-06-28 DIAGNOSIS — R279 Unspecified lack of coordination: Secondary | ICD-10-CM | POA: Diagnosis not present

## 2016-06-28 DIAGNOSIS — M542 Cervicalgia: Secondary | ICD-10-CM

## 2016-06-28 NOTE — Therapy (Signed)
[facility] [address], [place], [place], [address] Phone: [phone]   Fax:  [phone]  Physical Therapy Treatment  Patient Details  Name: Carolyn Berger MRN: [id number] Date of Birth: 19-Mar-1946 Referring Provider: Dr. Hoyt Koch  Encounter Date: 06/28/2016      PT End of Session - 06/28/16 1005    Visit Number 16   Number of Visits 20   Date for PT Re-Evaluation 08/05/16   Authorization Type gcodes at 20th visit   PT Start Time 0931  dry needling   PT Stop Time 1018   PT Time Calculation (min) 47 min   Activity Tolerance Patient tolerated treatment well   Behavior During Therapy [facility] for tasks assessed/performed      Past Medical History:  Diagnosis Date  . Allergy    seasonal allergies, presently upper respiratory symptoms"runny nose, post nasal drip, rare cough_tx. Zpack  . Cataract   . GERD (gastroesophageal reflux disease)    controls with diet and rare OTC meds  . Heart murmur   . Mitral valve prolapse    with moderate mitral regurgitation-LOV Dr. Percival Spanish 10-22-13    Past Surgical History:  Procedure Laterality Date  . ANTERIOR CRUCIATE LIGAMENT REPAIR Left   . BREAST BIOPSY     of benign lesions  . CHOLECYSTECTOMY N/A 08/02/2014   Procedure: LAPAROSCOPIC CHOLECYSTECTOMY WITH INTRAOPERATIVE CHOLANGIOGRAM;  Surgeon: Pedro Earls, MD;  [facility];  Service: General;  Laterality: N/A;  . minicus Right   . TEE WITHOUT CARDIOVERSION  07/23/2011   Procedure: TRANSESOPHAGEAL ECHOCARDIOGRAM (TEE);  Surgeon: Loralie Champagne, MD;  Location: [place];  Service: Cardiovascular;  Laterality: N/A;  . TUBAL LIGATION      There were no vitals filed for this visit.      Subjective Assessment - 06/28/16 0934    Subjective Had a bad night last night.  Had increased pain at the end of the day and again today.     Pertinent History knee surgeries on Rt and Lt knees, history of lumbar disc  hernation to Rt and Lt   Diagnostic tests x-ray, shows narrowing c4/5   Patient Stated Goals return to karate and able to work on computer without increased pain   Currently in Pain? Yes   Pain Score 5    Pain Location Neck   Pain Orientation Right   Pain Descriptors / Indicators Sore   Pain Type Chronic pain   Pain Onset More than a month ago   Pain Frequency Intermittent   Aggravating Factors  karate, turning head, karate   Pain Relieving Factors restm massage, dry needling                         [facility] Adult PT Treatment/Exercise - 06/28/16 0001      Moist Heat Therapy   Number Minutes Moist Heat 15 Minutes   Moist Heat Location Cervical     Electrical Stimulation   Electrical Stimulation Location cervical   Electrical Stimulation Action IFC   Electrical Stimulation Parameters 15 minutes   Electrical Stimulation Goals Pain     Manual Therapy   Manual Therapy Soft tissue mobilization;Myofascial release   Soft tissue mobilization Cervical scalenes, suboccipitals RT>LT with static stretching to RT scalenes and levator, Rt pecs and upper traps   Myofascial Release posterior cervical myofascial release          Trigger Point Dry Needling - 06/28/16 1007  Consent Given? Yes   Muscles Treated Upper Body Upper trapezius;Levator scapulae  bilateral cervical multifidi   Upper Trapezius Response Twitch reponse elicited;Palpable increased muscle length   Levator Scapulae Response Palpable increased muscle length   Infraspinatus Response Palpable increased muscle length                PT Short Term Goals - 06/25/16 1026      PT SHORT TERM GOAL #1   Title Pt will be independent with initial HEP   Status Achieved     PT SHORT TERM GOAL #2   Title Pt will be able to turn head for looking over her shoulder when driving wihtout increased pain    Status Achieved     PT SHORT TERM GOAL #3   Title Pt will be able to perform functional squat without  hyperactivity of UT muscles in order to do warm ups in karate classes.   Status Achieved           PT Long Term Goals - 06/28/16 0936      PT LONG TERM GOAL #1   Title Pt will be independent with advanced HEP   Time 8   Period Weeks   Status On-going     PT LONG TERM GOAL #2   Title FOTO < or += to 35%   Time 8   Period Weeks   Status On-going     PT LONG TERM GOAL #3   Title Pt will demonstrate improved posture with greater shoulder depression and be able to sustain it during reaching forward and overhead activities   Status Achieved     PT LONG TERM GOAL #4   Title Pt will be able to return to karate class without increased neck or shoulder pain.   Time 8   Period Weeks   Status On-going     PT LONG TERM GOAL #5   Title Sit at the computer for an hour and take breaks to manage pain and not have increased pain with 5-6 hours a day of computer work   Time 8   Period Weeks   Status On-going               Plan - 06/28/16 1003    Clinical Impression Statement Pt with flare-up of neck pain starting yesterday evening.  Pt reports 5-6/10 pain today.  Pt with trigger points in bil. neck and upper traps.  Pt with improved tissue length and reduced pain after treatement today.  PT focused on manual techniques and pain modalities due to flare-up today.  No new goals met.  Pt will continue to benefit from skilled PT for flexiblity, manual and strength.     Rehab Potential Excellent   PT Frequency 2x / week   PT Duration 8 weeks   PT Treatment/Interventions Cryotherapy;Moist Heat;Electrical Stimulation;Functional mobility training;Therapeutic activities;Therapeutic exercise;Neuromuscular re-education;Patient/family education;Manual techniques;Passive range of motion;Traction;Dry needling   PT Next Visit Plan assess response to DN #3;  thoracic extension;  neck stabilization; manual therapy   Consulted and Agree with Plan of Care Patient      Patient will benefit from  skilled therapeutic intervention in order to improve the following deficits and impairments:  Decreased coordination, Decreased strength, Postural dysfunction, Pain, Hypomobility, Decreased range of motion  Visit Diagnosis: Cervicalgia  Other muscle spasm     Problem List Patient Active Problem List   Diagnosis Date Noted  . Genetic testing 10/09/2014  . History of laparoscopic cholecystectomy 08/02/2014  .  Psoriasis 10/02/2012  . Dyspnea 07/01/2011  . HYPERLIPIDEMIA 12/23/2008  . MITRAL REGURGITATION 12/20/2008  . Mitral valve disorder 12/20/2008     Sigurd Sos, PT 06/28/16 10:09 AM  [facility] [address] [place], [place], [address] Phone: [phone]   Fax:  [phone]  Name: Carolyn Berger MRN: [id number] Date of Birth: 14-Feb-1946

## 2016-06-30 ENCOUNTER — Ambulatory Visit: Payer: Medicare Other | Admitting: Physical Therapy

## 2016-06-30 DIAGNOSIS — M62838 Other muscle spasm: Secondary | ICD-10-CM

## 2016-06-30 DIAGNOSIS — R279 Unspecified lack of coordination: Secondary | ICD-10-CM

## 2016-06-30 DIAGNOSIS — M542 Cervicalgia: Secondary | ICD-10-CM

## 2016-06-30 NOTE — Therapy (Signed)
[facility] [address], [place], [place], [address] Phone: [phone]   Fax:  [phone]  Physical Therapy Treatment  Patient Details  Name: Carolyn Berger MRN: [id number] Date of Birth: 1946-03-16 Referring Provider: Dr. Hoyt Koch  Encounter Date: 06/30/2016      PT End of Session - 06/30/16 1020    Visit Number 17   Number of Visits 20   Date for PT Re-Evaluation 08/05/16   Authorization Type gcodes at 20th visit   PT Start Time 0933   PT Stop Time 1019   PT Time Calculation (min) 46 min   Activity Tolerance Patient tolerated treatment well   Behavior During Therapy [facility] for tasks assessed/performed      Past Medical History:  Diagnosis Date  . Allergy    seasonal allergies, presently upper respiratory symptoms"runny nose, post nasal drip, rare cough_tx. Zpack  . Cataract   . GERD (gastroesophageal reflux disease)    controls with diet and rare OTC meds  . Heart murmur   . Mitral valve prolapse    with moderate mitral regurgitation-LOV Dr. Percival Spanish 10-22-13    Past Surgical History:  Procedure Laterality Date  . ANTERIOR CRUCIATE LIGAMENT REPAIR Left   . BREAST BIOPSY     of benign lesions  . CHOLECYSTECTOMY N/A 08/02/2014   Procedure: LAPAROSCOPIC CHOLECYSTECTOMY WITH INTRAOPERATIVE CHOLANGIOGRAM;  Surgeon: Pedro Earls, MD;  [facility];  Service: General;  Laterality: N/A;  . minicus Right   . TEE WITHOUT CARDIOVERSION  07/23/2011   Procedure: TRANSESOPHAGEAL ECHOCARDIOGRAM (TEE);  Surgeon: Loralie Champagne, MD;  Location: [place];  Service: Cardiovascular;  Laterality: N/A;  . TUBAL LIGATION      There were no vitals filed for this visit.      Subjective Assessment - 06/30/16 1015    Subjective Been feeling a lot of weakness in the Rt shoulder and scheduled an MRI for the 30th.  States she felt worse after previous treatment.  Reports she is taking a break from karate.  Denies  pain currently unless turning head to the Rt.   Currently in Pain? No/denies            [facility] PT Assessment - 06/30/16 0001      Strength   Right/Left Shoulder Right   Right Shoulder Flexion 4-/5   Right Shoulder ABduction 4-/5   Right Shoulder Horizontal ABduction 4-/5   Right Shoulder Horizontal ADduction 4-/5                     [facility] Adult PT Treatment/Exercise - 06/30/16 0001      Neck Exercises: Seated   Other Seated Exercise active thoracic extension with arms in external rotation and abduction for mid/low trap engagement - 10x   Other Seated Exercise seated thoracic mobs using back of the chair with neck supported     Manual Therapy   Manual Therapy Soft tissue mobilization;Myofascial release;Joint mobilization   Joint Mobilization C4-7 P-A, left lateral glides, right and left mob with movement 3x  each   Soft tissue mobilization Cervical scalenes, suboccipitals RT>LT with static stretching to RT scalenes and levator, Rt pecs and upper traps   Myofascial Release posterior cervical myofascial release   Manual Traction 3x 30 sec                  PT Short Term Goals - 06/25/16 1026      PT SHORT TERM GOAL #1   Title  Pt will be independent with initial HEP   Status Achieved     PT SHORT TERM GOAL #2   Title Pt will be able to turn head for looking over her shoulder when driving wihtout increased pain    Status Achieved     PT SHORT TERM GOAL #3   Title Pt will be able to perform functional squat without hyperactivity of UT muscles in order to do warm ups in karate classes.   Status Achieved           PT Long Term Goals - 06/30/16 1022      PT LONG TERM GOAL #1   Title Pt will be independent with advanced HEP   Time 8   Period Weeks   Status On-going     PT LONG TERM GOAL #4   Title Pt will be able to return to karate class without increased neck or shoulder pain.   Time 8   Period Weeks   Status On-going                Plan - 06/30/16 1022    Clinical Impression Statement Pt continues to have increased pain.  Shoulder weakness Rt side grossly 4-/5.  Pt will benefit from skilled PT to address weakness and postural deficits   Rehab Potential Excellent   Clinical Impairments Affecting Rehab Potential none   PT Frequency 2x / week   PT Duration 8 weeks   PT Treatment/Interventions Cryotherapy;Moist Heat;Electrical Stimulation;Functional mobility training;Therapeutic activities;Therapeutic exercise;Neuromuscular re-education;Patient/family education;Manual techniques;Passive range of motion;Traction;Dry needling   PT Next Visit Plan assess response to adding thoracic extension, shoulder strengthening, cervical stability, manual as needed   PT Home Exercise Plan add scalene stretch    Consulted and Agree with Plan of Care Patient      Patient will benefit from skilled therapeutic intervention in order to improve the following deficits and impairments:  Decreased coordination, Decreased strength, Postural dysfunction, Pain, Hypomobility, Decreased range of motion  Visit Diagnosis: Cervicalgia  Other muscle spasm  Unspecified lack of coordination     Problem List Patient Active Problem List   Diagnosis Date Noted  . Genetic testing 10/09/2014  . History of laparoscopic cholecystectomy 08/02/2014  . Psoriasis 10/02/2012  . Dyspnea 07/01/2011  . HYPERLIPIDEMIA 12/23/2008  . MITRAL REGURGITATION 12/20/2008  . Mitral valve disorder 12/20/2008    Zannie Cove, PT 06/30/2016, 12:18 PM  [facility] [address], [place], [place], [address] Phone: [phone]   Fax:  [phone]  Name: Carolyn Berger MRN: [id number] Date of Birth: 1945/07/29

## 2016-07-01 DIAGNOSIS — N952 Postmenopausal atrophic vaginitis: Secondary | ICD-10-CM | POA: Diagnosis not present

## 2016-07-05 ENCOUNTER — Ambulatory Visit: Payer: Medicare Other

## 2016-07-05 DIAGNOSIS — M542 Cervicalgia: Secondary | ICD-10-CM | POA: Diagnosis not present

## 2016-07-05 DIAGNOSIS — M62838 Other muscle spasm: Secondary | ICD-10-CM | POA: Diagnosis not present

## 2016-07-05 DIAGNOSIS — R279 Unspecified lack of coordination: Secondary | ICD-10-CM | POA: Diagnosis not present

## 2016-07-05 NOTE — Therapy (Signed)
[facility] [address], [place], [place], [address] Phone: [phone]   Fax:  [phone]  Physical Therapy Treatment  Patient Details  Name: Carolyn Berger MRN: [id number] Date of Birth: 01-02-1946 Referring Provider: Dr. Hoyt Koch  Encounter Date: 07/05/2016      PT End of Session - 07/05/16 1000    Visit Number 18   Number of Visits 20   Date for PT Re-Evaluation 08/05/16   Authorization Type gcodes at 20th visit   PT Start Time 0928   PT Stop Time 1010   PT Time Calculation (min) 42 min   Activity Tolerance Patient tolerated treatment well   Behavior During Therapy [facility] for tasks assessed/performed      Past Medical History:  Diagnosis Date  . Allergy    seasonal allergies, presently upper respiratory symptoms"runny nose, post nasal drip, rare cough_tx. Zpack  . Cataract   . GERD (gastroesophageal reflux disease)    controls with diet and rare OTC meds  . Heart murmur   . Mitral valve prolapse    with moderate mitral regurgitation-LOV Dr. Percival Spanish 10-22-13    Past Surgical History:  Procedure Laterality Date  . ANTERIOR CRUCIATE LIGAMENT REPAIR Left   . BREAST BIOPSY     of benign lesions  . CHOLECYSTECTOMY N/A 08/02/2014   Procedure: LAPAROSCOPIC CHOLECYSTECTOMY WITH INTRAOPERATIVE CHOLANGIOGRAM;  Surgeon: Pedro Earls, MD;  Location: [facility];  Service: General;  Laterality: N/A;  . minicus Right   . TEE WITHOUT CARDIOVERSION  07/23/2011   Procedure: TRANSESOPHAGEAL ECHOCARDIOGRAM (TEE);  Surgeon: Loralie Champagne, MD;  Location: [place];  Service: Cardiovascular;  Laterality: N/A;  . TUBAL LIGATION      There were no vitals filed for this visit.      Subjective Assessment - 07/05/16 0932    Subjective MRI is scheduled for tomorrow for neck.  Pt reports that pain seems to be getting worse.  Stopped karate as she felt like it was making things worse but hasn't noticed a difference.     Pertinent History knee surgeries on Rt and Lt knees, history of lumbar disc hernation to Rt and Lt   Diagnostic tests x-ray, shows narrowing c4/5   Patient Stated Goals return to karate and able to work on computer without increased pain   Currently in Pain? Yes   Pain Score 7    Pain Location Neck   Pain Orientation Right   Pain Descriptors / Indicators Sore   Pain Type Chronic pain   Pain Radiating Towards Rt shoulder   Pain Onset More than a month ago   Pain Frequency Intermittent   Aggravating Factors  inactivity (sitting), night time   Pain Relieving Factors traction, massage                         [facility] Adult PT Treatment/Exercise - 07/05/16 0001      Shoulder Exercises: ROM/Strengthening   UBE (Upper Arm Bike) Level 2 x 6 minutes (3/3)  PT present for goal assessment     Traction   Type of Traction Cervical   Min (lbs) 5   Max (lbs) 15   Hold Time 60   Rest Time 10   Time 15     Manual Therapy   Manual Therapy Soft tissue mobilization;Myofascial release;Joint mobilization   Myofascial Release posterior cervical myofascial release including suboccipitals and cervical paraspinals   Manual Traction 3x 30 sec  PT Short Term Goals - 06/25/16 1026      PT SHORT TERM GOAL #1   Title Pt will be independent with initial HEP   Status Achieved     PT SHORT TERM GOAL #2   Title Pt will be able to turn head for looking over her shoulder when driving wihtout increased pain    Status Achieved     PT SHORT TERM GOAL #3   Title Pt will be able to perform functional squat without hyperactivity of UT muscles in order to do warm ups in karate classes.   Status Achieved           PT Long Term Goals - 07/05/16 [id number]      PT LONG TERM GOAL #1   Title Pt will be independent with advanced HEP   Time 8   Period Weeks   Status On-going     PT LONG TERM GOAL #2   Title FOTO < or += to 35%   Time 8   Period Weeks   Status  On-going     PT LONG TERM GOAL #3   Title Pt will demonstrate improved posture with greater shoulder depression and be able to sustain it during reaching forward and overhead activities   Status Achieved     PT LONG TERM GOAL #4   Title Pt will be able to return to karate class without increased neck or shoulder pain.   Time 8   Period Weeks   Status On-going               Plan - 07/05/16 [id number]    Clinical Impression Statement Pt with continued Rt UE pain and Rt sided neck pain.  Pt will have MRI tomorrow.  Rt UE tested 4-/5 throughout last session.  Pt responded well to traction today.  Pt will attend 1 more session for soft tissue work, UE strength and traction prior to visit with neurosurgeon after MRI.  Pt will likely be placed on hold after next session.     Rehab Potential Excellent   PT Frequency 2x / week   PT Duration 8 weeks   PT Treatment/Interventions Cryotherapy;Moist Heat;Electrical Stimulation;Functional mobility training;Therapeutic activities;Therapeutic exercise;Neuromuscular re-education;Patient/family education;Manual techniques;Passive range of motion;Traction;Dry needling   PT Next Visit Plan FOTO next session.  Continue traction if helpful. Place on hold until pt sees neurosurgeon.    Consulted and Agree with Plan of Care Patient      Patient will benefit from skilled therapeutic intervention in order to improve the following deficits and impairments:  Decreased coordination, Decreased strength, Postural dysfunction, Pain, Hypomobility, Decreased range of motion  Visit Diagnosis: Cervicalgia  Other muscle spasm     Problem List Patient Active Problem List   Diagnosis Date Noted  . Genetic testing 10/09/2014  . History of laparoscopic cholecystectomy 08/02/2014  . Psoriasis 10/02/2012  . Dyspnea 07/01/2011  . HYPERLIPIDEMIA 12/23/2008  . MITRAL REGURGITATION 12/20/2008  . Mitral valve disorder 12/20/2008     Sigurd Sos, PT 07/05/16 10:03  AM  [facility] [address], [place], [place], [address] Phone: [phone]   Fax:  [phone]  Name: Carolyn Berger MRN: [id number] Date of Birth: [birth year]

## 2016-07-06 ENCOUNTER — Ambulatory Visit
Admission: RE | Admit: 2016-07-06 | Discharge: 2016-07-06 | Disposition: A | Discharge status: Home / Self Care | Payer: Medicare Other | Source: Ambulatory Visit | Attending: Internal Medicine | Admitting: Internal Medicine

## 2016-07-06 DIAGNOSIS — M50222 Other cervical disc displacement at C5-C6 level: Secondary | ICD-10-CM | POA: Diagnosis not present

## 2016-07-06 DIAGNOSIS — M542 Cervicalgia: Secondary | ICD-10-CM

## 2016-07-06 DIAGNOSIS — M50221 Other cervical disc displacement at C4-C5 level: Secondary | ICD-10-CM | POA: Diagnosis not present

## 2016-07-07 ENCOUNTER — Encounter: Payer: Self-pay | Admitting: Physical Therapy

## 2016-07-07 ENCOUNTER — Ambulatory Visit: Payer: Medicare Other | Admitting: Physical Therapy

## 2016-07-07 DIAGNOSIS — M62838 Other muscle spasm: Secondary | ICD-10-CM | POA: Diagnosis not present

## 2016-07-07 DIAGNOSIS — R279 Unspecified lack of coordination: Secondary | ICD-10-CM

## 2016-07-07 DIAGNOSIS — M542 Cervicalgia: Secondary | ICD-10-CM

## 2016-07-07 NOTE — Therapy (Signed)
[facility] [address], [place], [place], [address] Phone: [phone]   Fax:  [phone]  Physical Therapy Treatment  Patient Details  Name: Elecia Serafin MRN: [id number] Date of Birth: [birth year] Referring Provider: Dr. Hoyt Koch  Encounter Date: 07/07/2016      PT End of Session - 07/07/16 1012    Visit Number 19   Number of Visits 20   Date for PT Re-Evaluation 08/05/16   Authorization Type gcodes at 28th visit   PT Start Time 0927   PT Stop Time 1020   PT Time Calculation (min) 53 min   Activity Tolerance Patient tolerated treatment well   Behavior During Therapy [facility] for tasks assessed/performed      Past Medical History:  Diagnosis Date  . Allergy    seasonal allergies, presently upper respiratory symptoms"runny nose, post nasal drip, rare cough_tx. Zpack  . Cataract   . GERD (gastroesophageal reflux disease)    controls with diet and rare OTC meds  . Heart murmur   . Mitral valve prolapse    with moderate mitral regurgitation-LOV Dr. Percival Spanish 10-22-13    Past Surgical History:  Procedure Laterality Date  . ANTERIOR CRUCIATE LIGAMENT REPAIR Left   . BREAST BIOPSY     of benign lesions  . CHOLECYSTECTOMY N/A 08/02/2014   Procedure: LAPAROSCOPIC CHOLECYSTECTOMY WITH INTRAOPERATIVE CHOLANGIOGRAM;  Surgeon: Pedro Earls, MD;  Location: [facility];  Service: General;  Laterality: N/A;  . minicus Right   . TEE WITHOUT CARDIOVERSION  07/23/2011   Procedure: TRANSESOPHAGEAL ECHOCARDIOGRAM (TEE);  Surgeon: Loralie Champagne, MD;  Location: [place];  Service: Cardiovascular;  Laterality: N/A;  . TUBAL LIGATION      There were no vitals filed for this visit.      Subjective Assessment - 07/07/16 1010    Subjective States MRI found same thing as far as increased compression as shown in x-ray, but also that there was inflamation in cervical region. States she is still not doing karate.  Reports she  feels no difference when not doing karate and has been taking a break for 2 weeks.  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 >60 minutes   Diagnostic tests x-ray, shows narrowing c4/5   Patient Stated Goals return to karate and able to work on computer without increased pain   Currently in Pain? No/denies            [facility] PT Assessment - 07/07/16 0001      AROM   Cervical Flexion 45   Cervical Extension 60   Cervical - Right Side Bend 10   Cervical - Left Side Bend 5   Cervical - Right Rotation 50   Cervical - Left Rotation 45     Strength   Right Hip ABduction 5/5   Right Hip ADduction 5/5   Left Hip ABduction 5/5   Left Hip ADduction 5/5                     [facility] Adult PT Treatment/Exercise - 07/07/16 0001      Moist Heat Therapy   Number Minutes Moist Heat 15 Minutes   Moist Heat Location Cervical     Electrical Stimulation   Electrical Stimulation Location cervical   Electrical Stimulation Action  IFC   Electrical Stimulation Parameters 15 minutes   Electrical Stimulation Goals Pain     Manual Therapy   Manual Therapy Myofascial release;Soft tissue mobilization;Joint mobilization   Joint Mobilization C3-7 left lateral glides, right and left mob with movement 3x  each   Soft tissue mobilization Cervical scalenes, suboccipitals RT>LT with static stretching to RT scalenes and levator, Rt pecs and upper traps   Myofascial Release posterior cervical myofascial release including suboccipitals and cervical paraspinals   Manual Traction 6x 30 sec                  PT Short Term Goals - 07/07/16 1015      PT SHORT TERM GOAL #1   Title Pt will be independent with initial HEP   Time 4   Period Weeks   Status Achieved     PT SHORT TERM GOAL #2   Title Pt will be  able to turn head for looking over her shoulder when driving wihtout increased pain    Time 4   Period Weeks   Status Achieved     PT SHORT TERM GOAL #3   Title Pt will be able to perform functional squat without hyperactivity of UT muscles in order to do warm ups in karate classes.   Time 4   Period Weeks   Status Achieved           PT Long Term Goals - 07/07/16 1016      PT LONG TERM GOAL #1   Title Pt will be independent with advanced HEP   Baseline reinforced stretches and to focus on awareness of upper traps during functional movements   Time 8   Period Weeks   Status Achieved     PT LONG TERM GOAL #2   Title FOTO < or += to 35%   Baseline 46% limited   Time 8   Period Weeks   Status Not Met     PT LONG TERM GOAL #3   Title Pt will demonstrate improved posture with greater shoulder depression and be able to sustain it during reaching forward and overhead activities   Time 8   Period Weeks   Status Achieved     PT LONG TERM GOAL #4   Title Pt will be able to return to karate class without increased neck or shoulder pain.   Time 8   Period Weeks   Status Not Met     PT LONG TERM GOAL #5   Title Sit at the computer for an hour and take breaks to manage pain and not have increased pain with 5-6 hours a day of computer work   Time 8   Period Weeks   Status Not Met               Plan - 07/07/16 1013    Clinical Impression Statement Pt will discharge with HEP as she is doing well with home exercise program.  At this time ROM is not improving and strength has returned with taking a break from karate.  Pt demonstrates persistant episodes of pain that appear to be related to increased sitting at computer and related to posture.  Pt is able to work on postural strengthening and has good understanding of how to perform these exericses at home.  She will also be seeing neurosurgeon and get second opinion on how to proceed.    Rehab Potential Excellent   Clinical  Impairments Affecting Rehab Potential none  PT Frequency 2x / week   PT Duration 8 weeks   PT Treatment/Interventions Cryotherapy;Moist Heat;Electrical Stimulation;Functional mobility training;Therapeutic activities;Therapeutic exercise;Neuromuscular re-education;Patient/family education;Manual techniques;Passive range of motion;Traction;Dry needling   PT Next Visit Plan discharge with HEP   Consulted and Agree with Plan of Care Patient      Patient will benefit from skilled therapeutic intervention in order to improve the following deficits and impairments:  Decreased coordination, Decreased strength, Postural dysfunction, Pain, Hypomobility, Decreased range of motion  Visit Diagnosis: Cervicalgia  Other muscle spasm  Unspecified lack of coordination     Problem List Patient Active Problem List   Diagnosis Date Noted  . Genetic testing 10/09/2014  . History of laparoscopic cholecystectomy 08/02/2014  . Psoriasis 10/02/2012  . Dyspnea 07/01/2011  . HYPERLIPIDEMIA 12/23/2008  . MITRAL REGURGITATION 12/20/2008  . Mitral valve disorder 12/20/2008    Zannie Cove, PT 07/07/2016, 12:18 PM  [facility] [address], [place], [place], [address] Phone: [phone]   Fax:  [phone]  Name: Majorie Santee MRN: [id number] Date of Birth: 07/21/1945  PHYSICAL THERAPY DISCHARGE SUMMARY  Visits from Start of Care: 19  Current functional level related to goals / functional outcomes: See above note   Remaining deficits: See above note   Education / Equipment: HEP Plan: Patient agrees to discharge.  Patient goals were not met. Patient is being discharged due to lack of progress.  ?????

## 2016-07-19 DIAGNOSIS — M542 Cervicalgia: Secondary | ICD-10-CM | POA: Diagnosis not present

## 2016-07-19 DIAGNOSIS — M4802 Spinal stenosis, cervical region: Secondary | ICD-10-CM | POA: Diagnosis not present

## 2016-07-19 DIAGNOSIS — M5412 Radiculopathy, cervical region: Secondary | ICD-10-CM | POA: Diagnosis not present

## 2016-07-19 DIAGNOSIS — M50221 Other cervical disc displacement at C4-C5 level: Secondary | ICD-10-CM | POA: Diagnosis not present

## 2016-07-26 DIAGNOSIS — Z79899 Other long term (current) drug therapy: Secondary | ICD-10-CM | POA: Diagnosis not present

## 2016-07-26 DIAGNOSIS — Z9049 Acquired absence of other specified parts of digestive tract: Secondary | ICD-10-CM | POA: Diagnosis not present

## 2016-07-26 DIAGNOSIS — Z9851 Tubal ligation status: Secondary | ICD-10-CM | POA: Diagnosis not present

## 2016-07-26 DIAGNOSIS — E785 Hyperlipidemia, unspecified: Secondary | ICD-10-CM | POA: Diagnosis not present

## 2016-07-26 DIAGNOSIS — K529 Noninfective gastroenteritis and colitis, unspecified: Secondary | ICD-10-CM | POA: Diagnosis not present

## 2016-07-26 DIAGNOSIS — K5909 Other constipation: Secondary | ICD-10-CM | POA: Diagnosis not present

## 2016-07-26 DIAGNOSIS — I10 Essential (primary) hypertension: Secondary | ICD-10-CM | POA: Diagnosis not present

## 2016-07-26 DIAGNOSIS — Z79810 Long term (current) use of selective estrogen receptor modulators (SERMs): Secondary | ICD-10-CM | POA: Diagnosis not present

## 2016-08-13 DIAGNOSIS — M5021 Other cervical disc displacement,  high cervical region: Secondary | ICD-10-CM | POA: Diagnosis not present

## 2016-08-13 DIAGNOSIS — M47892 Other spondylosis, cervical region: Secondary | ICD-10-CM | POA: Diagnosis not present

## 2016-08-13 DIAGNOSIS — M5031 Other cervical disc degeneration,  high cervical region: Secondary | ICD-10-CM | POA: Diagnosis not present

## 2016-08-13 DIAGNOSIS — M50121 Cervical disc disorder at C4-C5 level with radiculopathy: Secondary | ICD-10-CM | POA: Diagnosis not present

## 2016-08-13 DIAGNOSIS — M4722 Other spondylosis with radiculopathy, cervical region: Secondary | ICD-10-CM | POA: Diagnosis not present

## 2016-08-13 HISTORY — PX: SPINE SURGERY: SHX786

## 2016-08-26 DIAGNOSIS — E784 Other hyperlipidemia: Secondary | ICD-10-CM | POA: Diagnosis not present

## 2016-09-06 DIAGNOSIS — M4802 Spinal stenosis, cervical region: Secondary | ICD-10-CM | POA: Diagnosis not present

## 2016-09-06 DIAGNOSIS — M542 Cervicalgia: Secondary | ICD-10-CM | POA: Diagnosis not present

## 2016-09-06 DIAGNOSIS — M5412 Radiculopathy, cervical region: Secondary | ICD-10-CM | POA: Diagnosis not present

## 2016-09-06 DIAGNOSIS — M50221 Other cervical disc displacement at C4-C5 level: Secondary | ICD-10-CM | POA: Diagnosis not present

## 2016-09-06 DIAGNOSIS — R030 Elevated blood-pressure reading, without diagnosis of hypertension: Secondary | ICD-10-CM | POA: Diagnosis not present

## 2016-09-14 DIAGNOSIS — J029 Acute pharyngitis, unspecified: Secondary | ICD-10-CM | POA: Diagnosis not present

## 2016-09-14 DIAGNOSIS — R05 Cough: Secondary | ICD-10-CM | POA: Diagnosis not present

## 2016-09-14 DIAGNOSIS — R197 Diarrhea, unspecified: Secondary | ICD-10-CM | POA: Diagnosis not present

## 2016-09-14 DIAGNOSIS — K5900 Constipation, unspecified: Secondary | ICD-10-CM | POA: Diagnosis not present

## 2016-09-14 DIAGNOSIS — R140 Abdominal distension (gaseous): Secondary | ICD-10-CM | POA: Diagnosis not present

## 2016-09-20 DIAGNOSIS — K529 Noninfective gastroenteritis and colitis, unspecified: Secondary | ICD-10-CM | POA: Diagnosis not present

## 2016-09-20 DIAGNOSIS — K5902 Outlet dysfunction constipation: Secondary | ICD-10-CM | POA: Diagnosis not present

## 2016-10-11 ENCOUNTER — Ambulatory Visit: Payer: Medicare Other | Attending: Internal Medicine | Admitting: Physical Therapy

## 2016-10-11 ENCOUNTER — Encounter: Payer: Self-pay | Admitting: Physical Therapy

## 2016-10-11 DIAGNOSIS — M6281 Muscle weakness (generalized): Secondary | ICD-10-CM | POA: Diagnosis not present

## 2016-10-11 DIAGNOSIS — R278 Other lack of coordination: Secondary | ICD-10-CM | POA: Diagnosis not present

## 2016-10-11 DIAGNOSIS — M62838 Other muscle spasm: Secondary | ICD-10-CM | POA: Diagnosis not present

## 2016-10-11 NOTE — Patient Instructions (Signed)
About Abdominal Massage  Abdominal massage, also called external colon massage, is a self-treatment circular massage technique that can reduce and eliminate gas and ease constipation. The colon naturally contracts in waves in a clockwise direction starting from inside the right hip, moving up toward the ribs, across the belly, and down inside the left hip.  When you perform circular abdominal massage, you help stimulate your colon's normal wave pattern of movement called peristalsis.  It is most beneficial when done after eating.  Positioning You can practice abdominal massage with oil while lying down, or in the shower with soap.  Some people find that it is just as effective to do the massage through clothing while sitting or standing.  How to Massage Start by placing your finger tips or knuckles on your right side, just inside your hip bone.  . Make small circular movements while you move upward toward your rib cage.   . Once you reach the bottom right side of your rib cage, take your circular movements across to the left side of the bottom of your rib cage.  . Next, move downward until you reach the inside of your left hip bone.  This is the path your feces travel in your colon. . Continue to perform your abdominal massage in this pattern for 10 minutes each day.     You can apply as much pressure as is comfortable in your massage.  Start gently and build pressure as you continue to practice.  Notice any areas of pain as you massage; areas of slight pain may be relieved as you massage, but if you have areas of significant or intense pain, consult with your healthcare provider.  Other Considerations . General physical activity including bending and stretching can have a beneficial massage-like effect on the colon.  Deep breathing can also stimulate the colon because breathing deeply activates the same nervous system that supplies the colon.   . Abdominal massage should always be used in  combination with a bowel-conscious diet that is high in the proper type of fiber for you, fluids (primarily water), and a regular exercise program.    Do the wave on the abdomen by starting on the left lower abdomen going from heel to fingers, then push on the left side of adomen going from heel of and to fingers, end with right side of abdomen going from finger tip to heel of hand.   Toileting Techniques for Bowel Movements (Defecation) Using your belly (abdomen) and pelvic floor muscles to have a bowel movement is usually instinctive.  Sometimes people can have problems with these muscles and have to relearn proper defecation (emptying) techniques.  If you have weakness in your muscles, organs that are falling out, decreased sensation in your pelvis, or ignore your urge to go, you may find yourself straining to have a bowel movement.  You are straining if you are: . holding your breath or taking in a huge gulp of air and holding it  . keeping your lips and jaw tensed and closed tightly . turning red in the face because of excessive pushing or forcing . developing or worsening your  hemorrhoids . getting faint while pushing . not emptying completely and have to defecate many times a day  If you are straining, you are actually making it harder for yourself to have a bowel movement.  Many people find they are pulling up with the pelvic floor muscles and closing off instead of opening the anus. Due to lack  pelvic floor relaxation and coordination the abdominal muscles, one has to work harder to push the feces out.  Many people have never been taught how to defecate efficiently and effectively.  Notice what happens to your body when you are having a bowel movement.  While you are sitting on the toilet pay attention to the following areas: . Jaw and mouth position . Angle of your hips   . Whether your feet touch the ground or not . Arm placement  . Spine position . Waist . Belly tension . Anus  (opening of the anal canal)  An Evacuation/Defecation Plan   Here are the 4 basic points:  1. Lean forward enough for your elbows to rest on your knees 2. Support your feet on the floor or use a low stool if your feet don't touch the floor  3. Push out your belly as if you have swallowed a beach ball-you should feel a widening of your waist 4. Open and relax your pelvic floor muscles, rather than tightening around the anus      The following conditions my require modifications to your toileting posture:  . If you have had surgery in the past that limits your back, hip, pelvic, knee or ankle flexibility . Constipation   Your healthcare practitioner may make the following additional suggestions and adjustments:  1) Sit on the toilet  a) Make sure your feet are supported. b) Notice your hip angle and spine position-most people find it effective to lean forward or raise their knees, which can help the muscles around the anus to relax  c) When you lean forward, place your forearms on your thighs for support  2) Relax suggestions a) Breath deeply in through your nose and out slowly through your mouth as if you are smelling the flowers and blowing out the candles. b) To become aware of how to relax your muscles, contracting and releasing muscles can be helpful.  Pull your pelvic floor muscles in tightly by using the image of holding back gas, or closing around the anus (visualize making a circle smaller) and lifting the anus up and in.  Then release the muscles and your anus should drop down and feel open. Repeat 5 times ending with the feeling of relaxation. c) Keep your pelvic floor muscles relaxed; let your belly bulge out. d) The digestive tract starts at the mouth and ends at the anal opening, so be sure to relax both ends of the tube.  Place your tongue on the roof of your mouth with your teeth separated.  This helps relax your mouth and will help to relax the anus at the same  time.  3) Empty (defecation) a) Keep your pelvic floor and sphincter relaxed, then bulge your anal muscles.  Make the anal opening wide.  b) Stick your belly out as if you have swallowed a beach ball. c) Make your belly wall hard using your belly muscles while continuing to breathe. Doing this makes it easier to open your anus. d) Breath out and give a grunt (or try using other sounds such as ahhhh, shhhhh, ohhhh or grrrrrrr).  4) Finish a) As you finish your bowel movement, pull the pelvic floor muscles up and in.  This will leave your anus in the proper place rather than remaining pushed out and down. If you leave your anus pushed out and down, it will start to feel as though that is normal and give you incorrect signals about needing to have a bowel  movement.    Earlie Counts, PT [facility] [address] [place] [address], [place] [address]

## 2016-10-11 NOTE — Therapy (Signed)
[facility] [address], [place], [place], [address] Phone: [phone]   Fax:  [phone]  Physical Therapy Evaluation  Patient Details  Name: Carolyn Berger MRN: [id number] Date of Birth: 1945/11/30 Referring Provider: Dr. Milas Hock  Encounter Date: 10/11/2016      PT End of Session - 10/11/16 1316    Visit Number 1   Number of Visits 10   Date for PT Re-Evaluation 12/06/16   Authorization Type medicare g-code on 10th visit; Kx modifier on 15th   PT Start Time 1230   PT Stop Time 1308   PT Time Calculation (min) 38 min   Activity Tolerance Patient tolerated treatment well   Behavior During Therapy [facility] for tasks assessed/performed      Past Medical History:  Diagnosis Date  . Allergy    seasonal allergies, presently upper respiratory symptoms"runny nose, post nasal drip, rare cough_tx. Zpack  . Cataract   . GERD (gastroesophageal reflux disease)    controls with diet and rare OTC meds  . Heart murmur   . Mitral valve prolapse    with moderate mitral regurgitation-LOV Dr. Percival Spanish 10-22-13    Past Surgical History:  Procedure Laterality Date  . ANTERIOR CRUCIATE LIGAMENT REPAIR Left   . BREAST BIOPSY     of benign lesions  . CHOLECYSTECTOMY N/A 08/02/2014   Procedure: LAPAROSCOPIC CHOLECYSTECTOMY WITH INTRAOPERATIVE CHOLANGIOGRAM;  Surgeon: Pedro Earls, MD;  Location: [facility];  Service: General;  Laterality: N/A;  . minicus Right   . SPINE SURGERY  08/13/2016   fused C4-5  . TEE WITHOUT CARDIOVERSION  07/23/2011   Procedure: TRANSESOPHAGEAL ECHOCARDIOGRAM (TEE);  Surgeon: Loralie Champagne, MD;  Location: [place];  Service: Cardiovascular;  Laterality: N/A;  . TUBAL LIGATION      There were no vitals filed for this visit.       Subjective Assessment - 10/11/16 1235    Subjective 02/2015 [place]. I have 8-9 bowel movements per day.  I have  nuggets. Sometimes has to strain to have a bowel movement.    Currently in Pain? Yes   Pain Score 4    Pain Location Abdomen   Pain Orientation Lower   Pain Descriptors / Indicators Cramping;Aching  full, bloated   Pain Type Chronic pain   Pain Onset More than a month ago   Pain Frequency Intermittent   Aggravating Factors  not going to the bathroom   Pain Relieving Factors after a bowel movement   Multiple Pain Sites No            [facility] PT Assessment - 10/11/16 0001      Assessment   Medical Diagnosis K59.02 constipation by outlet dysfunction; M62.89 Pelvic  floor dysfunction   Referring Provider Dr. Milas Hock   Onset Date/Surgical Date 02/06/15   Prior Therapy None     Precautions   Precautions Cervical   Precaution Comments No karate, no lifitng anything heavy     Restrictions   Weight Bearing Restrictions No     Balance Screen   Has the patient fallen in the past 6 months No   Has the patient had a decrease in activity level because of a fear of falling?  No   Is the patient reluctant to leave their home because of a fear of falling?  No     Home Ecologist residence     Prior Function  Level of Independence Independent   Vocation Retired     Charity fundraiser Status Within Functional Limits for tasks assessed     Observation/Other Assessments   Focus on Therapeutic Outcomes (FOTO)  40% limitation for bowel survey  goal is 35% limitation     ROM / Strength   AROM / PROM / Strength AROM;PROM;Strength     AROM   Overall AROM  Within functional limits for tasks performed     PROM   Overall PROM  Within functional limits for tasks performed     Strength   Overall Strength Comments bil. hip abduction 4/5; abdominal strength is 3/5     Palpation   SI assessment  pelvis in correct alignment     Transfers   Transfers Not assessed     Ambulation/Gait   Ambulation/Gait No                  Pelvic Floor Special Questions - 10/11/16 0001    Urinary Leakage No   Fecal incontinence No   Skin Integrity Intact  extra skin folds around the anus   Pelvic Floor Internal Exam Patient confirms identification and approves Pt to assess muscle integrity   Exam Type Rectal   Palpation no lift of the external sphincter, tightness in the puborectalis; able to push therapist finger out when bearing down   Strength weak squeeze, no lift   Tone increased tone          [facility] Adult PT Treatment/Exercise - 10/11/16 0001      Posture/Postural Control   Posture/Postural Control No significant limitations                PT Education - 10/11/16 1315    Education provided Yes   Education Details abdominal massage; toileting technique, colon stimulation   Person(s) Educated Patient   Methods Explanation;Demonstration;Handout   Comprehension Verbalized understanding;Returned demonstration          PT Short Term Goals - 10/11/16 1326      PT SHORT TERM GOAL #1   Title Pt will be independent with initial HEP   Time 4   Period Weeks   Status New     PT SHORT TERM GOAL #2   Title understand how to perfrom abdominal massage to promote peristalic movement of the intestines   Time 4   Period Weeks   Status New     PT SHORT TERM GOAL #3   Title reduction of bowel movements to 5 times per day due to improved coordination of pelvic floor muscles   Time 4   Period Weeks   Status New     PT SHORT TERM GOAL #4   Title understand on how to perfrom diaphragmatic breathing to move pelvic floor muscles through their excursion   Time 4   Period Weeks   Status New           PT Long Term Goals - 10/11/16 1328      PT LONG TERM GOAL #1   Title Pt will be independent with advanced HEP   Time 8   Period Weeks   Status New     PT LONG TERM GOAL #2   Title FOTO < or += to 35% limitation   Time 8   Period Weeks   Status New     PT LONG TERM GOAL #3   Title pain prior to  bowel movement decreased >/= 50% due to improved movement of  the stool   Time 8   Period Weeks   Status New     PT LONG TERM GOAL #4   Title ability to have 3</= bowel movements due to reduction in constipation    Time 8   Period Weeks   Status New     PT LONG TERM GOAL #5   Title understand how to relax the anal area during a bowel movement to have a complete bowel movement   Time 8   Period Weeks   Status New               Plan - 10/11/16 1317    Clinical Impression Statement Patient is a 71 year old female with pelvic floor dysfunction.  Patient goes from constipation to diahrrea. Patient reports pain in lower abdominal area intermittently at level 4/10 prior to a bowel movement and feels better after a bowel movement.  Patient has 10 bowel movements per day that are hard nuggets.  Patient mamomentry from the doctor showed weak external sphincter, elevated plevic floor resting activity, low maximal squeeze activity and increase in pelvic floor activity with strain.  Pelvic floor strength is 2/5 due to no lift.  Patient was able to push therapist finger out.  Tightness is felt in the puborectalis muscle.  Patient  had cervical fusion on 08/13/2016 and is on lifting precautions.  Patient is a low complex patient due to stable condtion and no comorbidties that will impact care. Patient will benefit from skilled therapy to improve pelvic floor strength and coordination.    Rehab Potential Excellent   Clinical Impairments Affecting Rehab Potential s/p cervical fusion 08/13/2016   PT Frequency 1x / week   PT Duration 8 weeks   PT Treatment/Interventions Biofeedback;Therapeutic activities;Therapeutic exercise;Neuromuscular re-education;Patient/family education;Manual techniques   PT Next Visit Plan go over bowel health, relaxation of pelvic floor, diaphgramatic breathing   PT Home Exercise Plan progress as needed   Recommended Other Services none   Consulted and Agree with Plan of Care  Patient      Patient will benefit from skilled therapeutic intervention in order to improve the following deficits and impairments:  Decreased coordination, Increased fascial restricitons, Pain, Decreased strength, Increased muscle spasms  Visit Diagnosis: Muscle weakness (generalized) - Plan: PT plan of care cert/re-cert  Other lack of coordination - Plan: PT plan of care cert/re-cert  Other muscle spasm - Plan: PT plan of care cert/re-cert      G-Codes - 61/60/73 1331    Functional Assessment Tool Used (Outpatient Only) FOTO score is 40% limitation  goal is 35%limitation   Functional Limitation Other PT primary   Other PT Primary Current Status (X1062) At least 40 percent but less than 60 percent impaired, limited or restricted   Other PT Primary Goal Status (I9485) At least 20 percent but less than 40 percent impaired, limited or restricted       Problem List Patient Active Problem List   Diagnosis Date Noted  . Genetic testing 10/09/2014  . History of laparoscopic cholecystectomy 08/02/2014  . Psoriasis 10/02/2012  . Dyspnea 07/01/2011  . HYPERLIPIDEMIA 12/23/2008  . MITRAL REGURGITATION 12/20/2008  . Mitral valve disorder 12/20/2008    Earlie Counts, PT 10/11/16 1:34 PM   [facility] [address], [place] , [place], [address] Phone: [phone]   Fax:  [phone]  Name: Carolyn Berger MRN: [id number] Date of Birth: September 13, 1945

## 2016-10-18 ENCOUNTER — Encounter: Payer: Self-pay | Admitting: Physical Therapy

## 2016-10-18 ENCOUNTER — Ambulatory Visit: Payer: Medicare Other | Admitting: Physical Therapy

## 2016-10-18 DIAGNOSIS — M542 Cervicalgia: Secondary | ICD-10-CM | POA: Diagnosis not present

## 2016-10-18 DIAGNOSIS — R278 Other lack of coordination: Secondary | ICD-10-CM

## 2016-10-18 DIAGNOSIS — M50221 Other cervical disc displacement at C4-C5 level: Secondary | ICD-10-CM | POA: Diagnosis not present

## 2016-10-18 DIAGNOSIS — M6281 Muscle weakness (generalized): Secondary | ICD-10-CM

## 2016-10-18 DIAGNOSIS — M62838 Other muscle spasm: Secondary | ICD-10-CM | POA: Diagnosis not present

## 2016-10-18 DIAGNOSIS — M5412 Radiculopathy, cervical region: Secondary | ICD-10-CM | POA: Diagnosis not present

## 2016-10-18 DIAGNOSIS — M4802 Spinal stenosis, cervical region: Secondary | ICD-10-CM | POA: Diagnosis not present

## 2016-10-18 NOTE — Patient Instructions (Signed)
Sitting    Sit comfortably. Allow body's muscles to relax. Place hands on belly. Inhale slowly and deeply for _3__ seconds, so hands move out. Then take _3__ seconds to exhale. Repeat _5__ times. Do _2 times per day and just before a bowel movement.   Hook-Lying    Lie with hips and knees bent. Allow body's muscles to relax. Place hands on belly. Inhale slowly and deeply for __3_ seconds, so hands move up. Then take __3_ seconds to exhale. Repeat _5__ times. Do __2_ times a day.   Copyright  VHI. All rights reserved.  Bear Down    Exhaling, bear down as if to have a bowel movement. Feel anus opening up. Can do in sitting or lying down.  Repeat _5__ times. Do _3__ times a day.  Copyright  VHI. All rights reserved.   Introduction to Bowel Health Diet and daily habits can help you predict when your bowels will move on a regular basis.  The consistency and quantity of the stool is usually more important than the frequency.  The goal is to have a regular bowel movement that is soft but formed.   Tips on Emptying Regularly . Eat breakfast.  Usually the best time of day for a bowel movement will be a half hour to an hour after eating.  These times are best because the body uses the gastrocolic reflex, a stimulation of bowel motion that occurs with eating, to help produce a bowel movement.  For some people even a simple hot drink in the morning can help the reflex action begin. . Eat all your meals at a predictable time each day.  The bowel functions best when food is introduced at the same regular intervals. . The amount of food eaten at a given time of day should be about the same size from day to day.  The bowel functions best when food is introduced in similar quantities from day to day. It is fine to have a small breakfast and a large lunch, or vice versa, just be consistent. . Eat two servings of fruit or vegetables and at least one serving of a complex carbohydrates (whole grains such  as brown rice, bran, whole wheat bread, or oatmeal) at each meal. . Drink plenty of water-ideally eight glasses a day.  Be sure to increase your water intake if you are increasing fiber into your diet.  Maintain Healthy Habits . Exercise daily.  You may exercise at any time of day, but you may find that bowel function is helped most if the exercise is at a consistent time each day. . Make sure that you are not rushed and have convenient access to a bathroom at your selected time to empty your bowels.   Introduction to Fiber Fiber Overview Dietary fiber is the part of plants that can't be digested. There are 2 kinds of dietary fiber: insoluble and soluble.  Insoluble fiber adds bulk to keep foods moving through the digestive system. Soluble fiber holds water, which softens the stool for easy bowel movements. Fiber is an important part of your diet, even though it passes through your body undigested and has no nutritional value. A high fiber diet can:  . promote regular bowel movements  . treat diverticular disease (inflammation of part of the intestine) and irritable bowel syndrome (abdominal pain, diarrhea, and constipation that come and go) . promote improvement in hemorrhoids, constipation and fecal incontinence  You should have at least 14 grams of fiber for every 1000  calories you eat every day. Read the label on every food package to find out how much fiber a serving of the food will provide. Foods containing more than 20% of the daily value of fiber per serving are considered high in fiber.  Without enough fiber in your diet, you may suffer from:  . constipation  . small, hard, dry bowel movements.   Sources of Fiber Breads, cereals, and pasta made with whole grain flour, and brown rice are high fiber foods. Many breakfast cereals list the bran or fiber content, so it's easy to know which products are high in fiber.  All fruits and vegetables also contain fiber. Dried beans, leafy  vegetables, peas, raisins, prunes, apples, and citrus fruits are all especially good sources of fiber. Ask for examples of high-fiber foods (the fiber table and types of fiber handouts) for more resources on fiber.  Additional information on fiber content in foods, is available at www.caloriecounts.com Types of Fiber  There are two main types of fiber:  insoluble and soluble.  Both of these types can prevent and relieve constipation and diarrhea, although some people find one or the other to be more easily digested.  This handout details information about both types of fiber.  Insoluble Fiber       Functions of Insoluble Fiber . moves bulk through the intestines  . controls and balances the pH (acidity) in the intestines       Benefits of Insoluble Fiber . promotes regular bowel movement and prevents constipation  . removes fecal waste through colon in less time  . keeps an optimal pH in intestines to prevent microbes from producing cancer substances, therefore preventing colon cancer        Food Sources of Insoluble Fiber . whole-wheat products  . wheat bran "miller's bran" . corn bran  . flax seed or other seeds . vegetables such as green beans, broccoli, cauliflower and potato skins  . fruit skins and root vegetable skins  . popcorn . brown rice  Soluble Fiber       Functions of Soluble Fiber  . holds water in the colon to bulk and soften the stool . prolongs stomach emptying time so that sugar is released and absorbed more slowly        Benefits of Soluble Fiber . lowers total cholesterol and LDL cholesterol (the bad cholesterol) therefore reducing the risk of heart disease  . regulates blood sugar for people with diabetes       Food Sources of Soluble Fiber . oat/oat bran . dried beans and peas  . nuts  . barley  . flax seed or other seeds . fruits such as oranges, pears, peaches, and apples  . vegetables such as carrots  . psyllium husk  . [facility] [address], [place], [place] [address] Phone # [phone] Fax [phone]

## 2016-10-18 NOTE — Therapy (Signed)
[facility] [address] [place], [place], [address] Phone: [phone]   Fax:  [phone]  Physical Therapy Treatment  Patient Details  Name: Carolyn Berger MRN: [id number] Date of Birth: 05-Jul-1945 Referring Provider: Dr. Milas Hock  Encounter Date: 10/18/2016      PT End of Session - 10/18/16 1655    Visit Number 2   Number of Visits 10   Date for PT Re-Evaluation 12/06/16   Authorization Type medicare g-code on 10th visit; Kx modifier on 15th   PT Start Time 1530   PT Stop Time 1615   PT Time Calculation (min) 45 min   Activity Tolerance Patient tolerated treatment well   Behavior During Therapy [facility] for tasks assessed/performed      Past Medical History:  Diagnosis Date  . Allergy    seasonal allergies, presently upper respiratory symptoms"runny nose, post nasal drip, rare cough_tx. Zpack  . Cataract   . GERD (gastroesophageal reflux disease)    controls with diet and rare OTC meds  . Heart murmur   . Mitral valve prolapse    with moderate mitral regurgitation-LOV Dr. Percival Spanish 10-22-13    Past Surgical History:  Procedure Laterality Date  . ANTERIOR CRUCIATE LIGAMENT REPAIR Left   . BREAST BIOPSY     of benign lesions  . CHOLECYSTECTOMY N/A 08/02/2014   Procedure: LAPAROSCOPIC CHOLECYSTECTOMY WITH INTRAOPERATIVE CHOLANGIOGRAM;  Surgeon: Pedro Earls, MD;  [facility];  Service: General;  Laterality: N/A;  . minicus Right   . SPINE SURGERY  08/13/2016   fused C4-5  . TEE WITHOUT CARDIOVERSION  07/23/2011   Procedure: TRANSESOPHAGEAL ECHOCARDIOGRAM (TEE);  Surgeon: Loralie Champagne, MD;  Location: [place];  Service: Cardiovascular;  Laterality: N/A;  . TUBAL LIGATION      There were no vitals filed for this visit.      Subjective Assessment - 10/18/16 1532    Subjective I have been doing the abdominal massage.  I am still having 8-9 bowel movements per day.    Patient Stated  Goals reduce sudden urgency, cramping, and # of bowel movements   Currently in Pain? Yes   Pain Score 4    Pain Location Abdomen   Pain Orientation Lower   Pain Descriptors / Indicators Aching;Cramping  full, bloated   Pain Type Chronic pain   Pain Onset More than a month ago   Pain Frequency Intermittent   Aggravating Factors  not going to the bathroom   Pain Relieving Factors after a bowel movement   Multiple Pain Sites No                                 PT Education - 10/18/16 1654    Education provided Yes   Education Details diet, how to introduce fiber, pelvic floor contraction, diaphgramatic breathing   Person(s) Educated Patient   Methods Explanation;Demonstration;Verbal cues;Handout   Comprehension Returned demonstration;Verbalized understanding          PT Short Term Goals - 10/18/16 1659      PT SHORT TERM GOAL #1   Title Pt will be independent with initial HEP   Time 4   Period Weeks   Status On-going     PT SHORT TERM GOAL #2   Title understand how to perfrom abdominal massage to promote peristalic movement of the intestines   Time 4   Period Weeks   Status On-going  PT SHORT TERM GOAL #3   Title reduction of bowel movements to 5 times per day due to improved coordination of pelvic floor muscles   Time 4   Period Weeks   Status On-going     PT SHORT TERM GOAL #4   Title understand on how to perfrom diaphragmatic breathing to move pelvic floor muscles through their excursion   Time 4   Period Weeks   Status On-going           PT Long Term Goals - 10/11/16 1328      PT LONG TERM GOAL #1   Title Pt will be independent with advanced HEP   Time 8   Period Weeks   Status New     PT LONG TERM GOAL #2   Title FOTO < or += to 35% limitation   Time 8   Period Weeks   Status New     PT LONG TERM GOAL #3   Title pain prior to bowel movement decreased >/= 50% due to improved movement of the stool   Time 8   Period  Weeks   Status New     PT LONG TERM GOAL #4   Title ability to have 3</= bowel movements due to reduction in constipation    Time 8   Period Weeks   Status New     PT LONG TERM GOAL #5   Title understand how to relax the anal area during a bowel movement to have a complete bowel movement   Time 8   Period Weeks   Status New               Plan - 10/18/16 1656    Clinical Impression Statement Patient has just started therapy so she has not met goals.  Patient has tightness located in lower abdominal.  Patient is able to perform diaphgramatic breathing. Patient understands how to add fiber into her diet.  Patient will benefit from skilled therapy  to improve pelvic floor strength and coordination.    Rehab Potential Excellent   Clinical Impairments Affecting Rehab Potential s/p cervical fusion 08/13/2016   PT Frequency 1x / week   PT Duration 8 weeks   PT Treatment/Interventions Biofeedback;Therapeutic activities;Therapeutic exercise;Neuromuscular re-education;Patient/family education;Manual techniques   PT Next Visit Plan lower abdominal massage, pelvic floor EMG   PT Home Exercise Plan progress as needed   Consulted and Agree with Plan of Care Patient      Patient will benefit from skilled therapeutic intervention in order to improve the following deficits and impairments:  Decreased coordination, Increased fascial restricitons, Pain, Decreased strength, Increased muscle spasms  Visit Diagnosis: Muscle weakness (generalized)  Other lack of coordination  Other muscle spasm     Problem List Patient Active Problem List   Diagnosis Date Noted  . Genetic testing 10/09/2014  . History of laparoscopic cholecystectomy 08/02/2014  . Psoriasis 10/02/2012  . Dyspnea 07/01/2011  . HYPERLIPIDEMIA 12/23/2008  . MITRAL REGURGITATION 12/20/2008  . Mitral valve disorder 12/20/2008    Earlie Counts, PT 10/18/16 5:01 PM   [facility] [address], [place], [place], [address] Phone: [phone]   Fax:  [phone]  Name: Shaden Higley MRN: [id number] Date of Birth: 1945/08/02

## 2016-10-19 DIAGNOSIS — Z803 Family history of malignant neoplasm of breast: Secondary | ICD-10-CM | POA: Diagnosis not present

## 2016-10-19 DIAGNOSIS — Z09 Encounter for follow-up examination after completed treatment for conditions other than malignant neoplasm: Secondary | ICD-10-CM | POA: Diagnosis not present

## 2016-10-19 DIAGNOSIS — R922 Inconclusive mammogram: Secondary | ICD-10-CM | POA: Diagnosis not present

## 2016-10-25 ENCOUNTER — Encounter: Payer: Self-pay | Admitting: Physical Therapy

## 2016-11-02 ENCOUNTER — Ambulatory Visit: Payer: Medicare Other | Admitting: Physical Therapy

## 2016-11-02 ENCOUNTER — Encounter: Payer: Self-pay | Admitting: Physical Therapy

## 2016-11-02 DIAGNOSIS — M6281 Muscle weakness (generalized): Secondary | ICD-10-CM

## 2016-11-02 DIAGNOSIS — M62838 Other muscle spasm: Secondary | ICD-10-CM | POA: Diagnosis not present

## 2016-11-02 DIAGNOSIS — R278 Other lack of coordination: Secondary | ICD-10-CM | POA: Diagnosis not present

## 2016-11-02 NOTE — Therapy (Signed)
[facility] [address] [place], [place], [address] Phone: [phone]   Fax:  [phone]  Physical Therapy Treatment  Patient Details  Name: Carolyn Berger MRN: [id number] Date of Birth: 26-Nov-1945 Referring Provider: Dr. Milas Hock  Encounter Date: 11/02/2016      PT End of Session - 11/02/16 1537    Visit Number 3   Number of Visits 10   Date for PT Re-Evaluation 12/06/16   Authorization Type medicare g-code on 10th visit; Kx modifier on 15th   PT Start Time 1445   PT Stop Time 1525   PT Time Calculation (min) 40 min   Activity Tolerance Patient tolerated treatment well   Behavior During Therapy [facility] for tasks assessed/performed      Past Medical History:  Diagnosis Date  . Allergy    seasonal allergies, presently upper respiratory symptoms"runny nose, post nasal drip, rare cough_tx. Zpack  . Cataract   . GERD (gastroesophageal reflux disease)    controls with diet and rare OTC meds  . Heart murmur   . Mitral valve prolapse    with moderate mitral regurgitation-LOV Dr. Percival Spanish 10-22-13    Past Surgical History:  Procedure Laterality Date  . ANTERIOR CRUCIATE LIGAMENT REPAIR Left   . BREAST BIOPSY     of benign lesions  . CHOLECYSTECTOMY N/A 08/02/2014   Procedure: LAPAROSCOPIC CHOLECYSTECTOMY WITH INTRAOPERATIVE CHOLANGIOGRAM;  Surgeon: Pedro Earls, MD;  Location: [facility];  Service: General;  Laterality: N/A;  . minicus Right   . SPINE SURGERY  08/13/2016   fused C4-5  . TEE WITHOUT CARDIOVERSION  07/23/2011   Procedure: TRANSESOPHAGEAL ECHOCARDIOGRAM (TEE);  Surgeon: Loralie Champagne, MD;  Location: [place];  Service: Cardiovascular;  Laterality: N/A;  . TUBAL LIGATION      There were no vitals filed for this visit.      Subjective Assessment - 11/02/16 1446    Subjective I was doing well until I travel.  I was up alot last night due to being constipated.    Patient Stated  Goals reduce sudden urgency, cramping, and # of bowel movements   Currently in Pain? Yes   Pain Score 6    Pain Location Abdomen   Pain Orientation Lower   Pain Descriptors / Indicators Aching;Cramping   Pain Type Chronic pain   Pain Onset More than a month ago   Pain Frequency Intermittent   Aggravating Factors  not going to the [place]   Pain Relieving Factors after a bowel movement   Multiple Pain Sites No                      Pelvic Floor Special Questions - 11/02/16 0001    Biofeedback baseline resting level 0.85 uv; sit and hold contraction above 5 uv; ; relaxation of pelvic floor with facilitating having a bowel movement , work on contracting 50% for control   Biofeedback sensor type Surface  rectal   Biofeedback Activity 10 second hold;Quick contraction  in sitting           [facility] Adult PT Treatment/Exercise - 11/02/16 0001      Self-Care   Self-Care Other Self-Care Comments   Other Self-Care Comments  discussed ways to manage constipation when travelling and not at home including drinking water, eating vegetables, and some fiber                 PT Education - 11/02/16 1532    Education  provided Yes   Education Details pelvic floor strengthening and control   Person(s) Educated Patient   Methods Explanation;Demonstration;Verbal cues;Handout   Comprehension Verbalized understanding;Returned demonstration          PT Short Term Goals - 11/02/16 1545      PT SHORT TERM GOAL #1   Title Pt will be independent with initial HEP   Time 4   Period Weeks   Status Achieved     PT SHORT TERM GOAL #2   Title understand how to perfrom abdominal massage to promote peristalic movement of the intestines   Time 4   Period Weeks     PT SHORT TERM GOAL #3   Title reduction of bowel movements to 5 times per day due to improved coordination of pelvic floor muscles   Time 4   Period Weeks   Status Achieved     PT SHORT TERM GOAL #4   Title  understand on how to perfrom diaphragmatic breathing to move pelvic floor muscles through their excursion   Time 4   Period Weeks   Status Achieved           PT Long Term Goals - 11/02/16 1453      PT LONG TERM GOAL #3   Title pain prior to bowel movement decreased >/= 50% due to improved movement of the stool   Time 8   Period Weeks   Status On-going     PT LONG TERM GOAL #4   Title ability to have 3</= bowel movements due to reduction in constipation    Time 8   Period Weeks   Status On-going  3-4 time better in controlled envioroment     PT LONG TERM GOAL #5   Title understand how to relax the anal area during a bowel movement to have a complete bowel movement   Time 8   Period Weeks   Status On-going               Plan - 11/02/16 1538    Clinical Impression Statement Patient is able to have bowel movement 3 times a day when at her home.  When patient travels, she is unable to eat correctly and relax for a bowel movement making it more difficulty to have a bowel movement. Discussed ways to manage toileting with some added fiber to keep her regular.  Patient baseline for pelvic floor EMG is 0.85 uv , unable to contract longer than 2 seconds before her muscles will fatique.  Patient had difficulty with contracting 50% of muscle contraction and controlling the contraction.  Patient was able to relax the pelvic floor  with facilitated toileting technique by end of the treatment.  Patient will benefit from skilled therapy to improve pelvic floor strength and coordination.    Rehab Potential Excellent   Clinical Impairments Affecting Rehab Potential s/p cervical fusion 08/13/2016   PT Frequency 1x / week   PT Duration 8 weeks   PT Treatment/Interventions Biofeedback;Therapeutic activities;Therapeutic exercise;Neuromuscular re-education;Patient/family education;Manual techniques   PT Next Visit Plan lower abdominal massage, pelvic floor EMG; work on pelvic floor control and  contraction more than 5 seconds   PT Home Exercise Plan progress as needed   Consulted and Agree with Plan of Care Patient      Patient will benefit from skilled therapeutic intervention in order to improve the following deficits and impairments:  Decreased coordination, Increased fascial restricitons, Pain, Decreased strength, Increased muscle spasms  Visit Diagnosis: Muscle weakness (generalized)  Other lack of coordination  Other muscle spasm     Problem List Patient Active Problem List   Diagnosis Date Noted  . Genetic testing 10/09/2014  . History of laparoscopic cholecystectomy 08/02/2014  . Psoriasis 10/02/2012  . Dyspnea 07/01/2011  . HYPERLIPIDEMIA 12/23/2008  . MITRAL REGURGITATION 12/20/2008  . Mitral valve disorder 12/20/2008    Earlie Counts, PT 11/02/16 3:48 PM   [facility] [address] [place], [place], [address] Phone: [phone]   Fax:  [phone]  Name: Teddie Curd MRN: [id number] Date of Birth: 1945/10/11

## 2016-11-02 NOTE — Patient Instructions (Addendum)
Slow Contraction: Gravity Resisted (Sitting)    Sitting, slowly squeeze pelvic floor at 50% strength  for __5_ seconds. Rest for _5__ seconds. Repeat _5__ times. Do _3__ times a day.  Copyright  VHI. All rights reserved.    Quick Contraction: Gravity Resisted (Sitting)    Sitting, quickly squeeze then fully relax pelvic floor. Perform __1_ sets of _5__. Rest for _1__ seconds between sets. Do _3__ times a day.  Copyright  VHI. All rights reserved.  [place] [address], [place], [place] [address] Phone # [phone] Fax [phone]

## 2016-11-08 DIAGNOSIS — L400 Psoriasis vulgaris: Secondary | ICD-10-CM | POA: Diagnosis not present

## 2016-11-11 DIAGNOSIS — Z6823 Body mass index (BMI) 23.0-23.9, adult: Secondary | ICD-10-CM | POA: Diagnosis not present

## 2016-11-11 DIAGNOSIS — M25532 Pain in left wrist: Secondary | ICD-10-CM | POA: Diagnosis not present

## 2016-11-11 DIAGNOSIS — K5909 Other constipation: Secondary | ICD-10-CM | POA: Diagnosis not present

## 2016-11-11 DIAGNOSIS — I10 Essential (primary) hypertension: Secondary | ICD-10-CM | POA: Diagnosis not present

## 2016-11-17 ENCOUNTER — Encounter: Payer: Self-pay | Admitting: Physical Therapy

## 2016-11-17 ENCOUNTER — Ambulatory Visit: Payer: Medicare Other | Attending: Internal Medicine | Admitting: Physical Therapy

## 2016-11-17 DIAGNOSIS — M62838 Other muscle spasm: Secondary | ICD-10-CM | POA: Diagnosis not present

## 2016-11-17 DIAGNOSIS — R278 Other lack of coordination: Secondary | ICD-10-CM | POA: Diagnosis not present

## 2016-11-17 DIAGNOSIS — M6281 Muscle weakness (generalized): Secondary | ICD-10-CM

## 2016-11-17 NOTE — Therapy (Signed)
[facility] [address] [place], [place], [address] Phone: [phone]   Fax:  [phone]  Physical Therapy Treatment  Patient Details  Name: Carolyn Berger MRN: [id number] Date of Birth: 03/06/1946 Referring Provider: Dr. Milas Hock  Encounter Date: 11/17/2016      PT End of Session - 11/17/16 1451    Visit Number 4   Number of Visits 10   Date for PT Re-Evaluation 01/31/17   Authorization Type medicare g-code on 10th visit; Kx modifier on 15th   PT Start Time 1400   PT Stop Time 1445   PT Time Calculation (min) 45 min   Activity Tolerance Patient tolerated treatment well   Behavior During Therapy [facility] for tasks assessed/performed      Past Medical History:  Diagnosis Date  . Allergy    seasonal allergies, presently upper respiratory symptoms"runny nose, post nasal drip, rare cough_tx. Zpack  . Cataract   . GERD (gastroesophageal reflux disease)    controls with diet and rare OTC meds  . Heart murmur   . Mitral valve prolapse    with moderate mitral regurgitation-LOV Dr. Percival Spanish 10-22-13    Past Surgical History:  Procedure Laterality Date  . ANTERIOR CRUCIATE LIGAMENT REPAIR Left   . BREAST BIOPSY     of benign lesions  . CHOLECYSTECTOMY N/A 08/02/2014   Procedure: LAPAROSCOPIC CHOLECYSTECTOMY WITH INTRAOPERATIVE CHOLANGIOGRAM;  Surgeon: Pedro Earls, MD;  [facility];  Service: General;  Laterality: N/A;  . minicus Right   . SPINE SURGERY  08/13/2016   fused C4-5  . TEE WITHOUT CARDIOVERSION  07/23/2011   Procedure: TRANSESOPHAGEAL ECHOCARDIOGRAM (TEE);  Surgeon: Loralie Champagne, MD;  Location: [place];  Service: Cardiovascular;  Laterality: N/A;  . TUBAL LIGATION      There were no vitals filed for this visit.      Subjective Assessment - 11/17/16 1407    Subjective Physical therapy has helped by 15%. No stool leakage.  Can control the urge for a bowel movements.    Patient Stated Goals reduce sudden urgency, cramping, and # of bowel movements   Currently in Pain? No/denies            [facility] PT Assessment - 11/17/16 0001      Assessment   Medical Diagnosis K59.02 constipation by outlet dysfunction; M62.89 Pelvic  floor dysfunction   Referring Provider Dr. Milas Hock   Onset Date/Surgical Date 02/06/15   Prior Therapy None     Precautions   Precautions Cervical   Precaution Comments No karate, no lifitng anything heavy     Restrictions   Weight Bearing Restrictions No     Home Environment   Living Environment Private residence     Prior Function   Level of Independence Independent   Vocation Retired     Associate Professor   Overall Cognitive Status Within Functional Limits for tasks assessed     Observation/Other Assessments   Focus on Therapeutic Outcomes (FOTO)  40% limitation for bowel survey  goal is 35% limitation     AROM   Overall AROM  Within functional limits for tasks performed     PROM   Overall PROM  Within functional limits for tasks performed     Strength   Overall Strength Comments bil. hip abduction 4/5; abdominal strength is 3/5     Palpation   SI assessment  pelvis in correct alignment     Transfers   Transfers Not assessed  Ambulation/Gait   Ambulation/Gait No                  Pelvic Floor Special Questions - 11/17/16 0001    Urinary Leakage No   Fecal incontinence No   Skin Integrity Intact  extra skin folds around the anus   Biofeedback sensor type Surface  rectal   Biofeedback Activity Other;Quick contraction;10 second hold  relaxation with facilitated bowel movement           [facility] Adult PT Treatment/Exercise - 11/17/16 0001      Manual Therapy   Manual Therapy Soft tissue mobilization   Soft tissue mobilization abdominal massage to improve tissue mobility and intestinal mobility to improve constipation                PT Education - 11/17/16 1450    Education  provided Yes   Education Details continue with current HEP; breath during exercise, contract anus not the gluteals   Person(s) Educated Patient   Methods Explanation   Comprehension Verbalized understanding          PT Short Term Goals - 11/17/16 1404      PT SHORT TERM GOAL #2   Title understand how to perfrom abdominal massage to promote peristalic movement of the intestines   Time 4   Period Weeks   Status Achieved           PT Long Term Goals - 11/17/16 1404      PT LONG TERM GOAL #3   Title pain prior to bowel movement decreased >/= 50% due to improved movement of the stool   Time 8   Period Weeks   Status On-going  fewer days with pain     PT LONG TERM GOAL #4   Title ability to have 3</= bowel movements due to reduction in constipation    Time 8   Period Weeks   Status On-going  4 per day and better with controlled environment     PT LONG TERM GOAL #5   Title understand how to relax the anal area during a bowel movement to have a complete bowel movement   Time 8   Period Weeks   Status On-going               Plan - 11/17/16 1453    Clinical Impression Statement Patient feels she is not fully emptying her bowels. Patient is able to quickly contract then relax the pelvic floor. Patient is able to contract for 5 sec above 10uv. Patient is able to relax the pelvic floor with the ooo sound compared to grr sound.  Patient will benefit from skilled therapy to improve pelvic floor strength and coordination.    Rehab Potential Excellent   Clinical Impairments Affecting Rehab Potential s/p cervical fusion 08/13/2016   PT Frequency 1x / week   PT Duration 8 weeks   PT Treatment/Interventions Biofeedback;Therapeutic activities;Therapeutic exercise;Neuromuscular re-education;Patient/family education;Manual techniques   PT Next Visit Plan lower abdominal massage, pelvic floor EMG; work on pelvic floor control and contraction more than 10 seconds   PT Home Exercise  Plan progress as needed   Consulted and Agree with Plan of Care Patient      Patient will benefit from skilled therapeutic intervention in order to improve the following deficits and impairments:  Decreased coordination, Increased fascial restricitons, Pain, Decreased strength, Increased muscle spasms  Visit Diagnosis: Muscle weakness (generalized) - Plan: PT plan of care cert/re-cert  Other lack of coordination -  Plan: PT plan of care cert/re-cert  Other muscle spasm - Plan: PT plan of care cert/re-cert     Problem List Patient Active Problem List   Diagnosis Date Noted  . Genetic testing 10/09/2014  . History of laparoscopic cholecystectomy 08/02/2014  . Psoriasis 10/02/2012  . Dyspnea 07/01/2011  . HYPERLIPIDEMIA 12/23/2008  . MITRAL REGURGITATION 12/20/2008  . Mitral valve disorder 12/20/2008    Earlie Counts, PT 11/17/16 3:31 PM   [facility] [address], [place], [place], [address] Phone: [phone]   Fax:  [phone]  Name: Maxyne Derocher MRN: [id number] Date of Birth: 11-23-1945

## 2016-12-05 NOTE — Progress Notes (Signed)
HPI The patient presents for followup of mitral valve prolapse.   Since I last saw her she has done well except for treatment of IBS and some injuries to her cervical spine and ankle.  She has had no acute cardiac complaints excpet that her BP was not well controlled.  Crist Infante, MD stopped her Norvasc secondary to constipatin.    Echo demonstrated only mild MR last August.  She did have moderate AI.  From a cardiac standpoint she has done well.  The patient denies any new symptoms such as chest discomfort, neck or arm discomfort. There has been no new shortness of breath, PND or orthopnea. There have been no reported palpitations, presyncope or syncope.   Allergies  Allergen Reactions  . Typhoid Vaccines Rash  . Lobster [Shellfish Allergy] Nausea And Vomiting    Current Outpatient Prescriptions  Medication Sig Dispense Refill  . atorvastatin (LIPITOR) 80 MG tablet Take 80 mg by mouth daily.    . cetirizine (ZYRTEC) 5 MG tablet Take 5 mg by mouth every morning.     . clobetasol ointment (TEMOVATE) 2.56 % Apply 1 application topically 2 (two) times daily as needed (For psoriasis.).   5  . Desoximetasone (TOPICORT) 0.25 % ointment Apply 1 application topically 2 (two) times daily as needed (For psoriasis.).   4  . esomeprazole (NEXIUM) 20 MG capsule Take 20 mg by mouth daily at 12 noon.    Marland Kitchen glucosamine-chondroitin 500-400 MG tablet Take 2 tablets by mouth every morning. Reported on 08/28/2015    . Polyethyl Glycol-Propyl Glycol 0.4-0.3 % SOLN Place 2 drops into both eyes 4 (four) times daily as needed (For dry eyes.). Reported on 08/28/2015    . raloxifene (EVISTA) 60 MG tablet Take 60 mg by mouth every morning.     Marland Kitchen VAGIFEM 10 MCG TABS vaginal tablet Place 1 tablet vaginally daily.   4  . valsartan (DIOVAN) 160 MG tablet Take 1 tablet by mouth daily.  3  . chlorthalidone (HYGROTON) 25 MG tablet Take 1 tablet (25 mg total) by mouth daily. 90 tablet 3   No current facility-administered  medications for this visit.     Past Medical History:  Diagnosis Date  . Allergy    seasonal allergies, presently upper respiratory symptoms"runny nose, post nasal drip, rare cough_tx. Zpack  . Cataract   . GERD (gastroesophageal reflux disease)    controls with diet and rare OTC meds  . Heart murmur   . Mitral valve prolapse    with moderate mitral regurgitation-LOV Dr. Percival Spanish 10-22-13    Past Surgical History:  Procedure Laterality Date  . ANTERIOR CRUCIATE LIGAMENT REPAIR Left   . BREAST BIOPSY     of benign lesions  . CHOLECYSTECTOMY N/A 08/02/2014   Procedure: LAPAROSCOPIC CHOLECYSTECTOMY WITH INTRAOPERATIVE CHOLANGIOGRAM;  Surgeon: Pedro Earls, MD;  Location[facility];  Service: General;  Laterality: N/A;  . minicus Right   . SPINE SURGERY  08/13/2016   fused C4-5  . TEE WITHOUT CARDIOVERSION  07/23/2011   Procedure: TRANSESOPHAGEAL ECHOCARDIOGRAM (TEE);  Surgeon: Loralie Champagne, MD;  Location: [place];  Service: Cardiovascular;  Laterality: N/A;  . TUBAL LIGATION      ROS:  As stated in the HPI and negative for all other systems.  PHYSICAL EXAM BP (!) 162/94 (BP Location: Left Arm, Patient Position: Sitting, Cuff Size: Normal)   Pulse 72   Ht 5\' 5"  (1.651 m)   Wt 142 lb (64.4 kg)   BMI 23.63  kg/m   GENERAL:  Well appearing NECK:  No jugular venous distention, waveform within normal limits, carotid upstroke brisk and symmetric, no bruits, no thyromegaly LUNGS:  Clear to auscultation bilaterally BACK:  No CVA tenderness CHEST:  Unremarkable HEART:  PMI not displaced or sustained,S1 and S2 within normal limits, no S3, no S4, no clicks, no rubs, soft apical systolic murmur, no diastolic murmurs ABD:  Flat, positive bowel sounds normal in frequency in pitch, no bruits, no rebound, no guarding, no midline pulsatile mass, no hepatomegaly, no splenomegaly EXT:  2 plus pulses throughout, no edema, no cyanosis no clubbing   EKG:  Sinus rhythm, rate 72 , axis within  leftward,  intervals within normal limits, no acute ST-T wave changes poor anterior R wave progression.  No change since previous. .  12/06/2016   ASSESSMENT AND PLAN  MITRAL REGURGITATION/AI:  These were mild/moderate.  I will not order an echo this year but will follow up again next year.  She has no symptoms related to this at present.   PVCs:    She has no symptoms related to these.  No change in therapy is planned  HTN:  Her BP is not controlled.  I will add chlorthalidone 25 mg daily and she will keep a BP diary.

## 2016-12-06 ENCOUNTER — Encounter: Payer: Self-pay | Admitting: Cardiology

## 2016-12-06 ENCOUNTER — Ambulatory Visit (INDEPENDENT_AMBULATORY_CARE_PROVIDER_SITE_OTHER): Payer: Medicare Other | Admitting: Cardiology

## 2016-12-06 VITALS — BP 162/94 | HR 72 | Ht 65.0 in | Wt 142.0 lb

## 2016-12-06 DIAGNOSIS — I059 Rheumatic mitral valve disease, unspecified: Secondary | ICD-10-CM | POA: Diagnosis not present

## 2016-12-06 DIAGNOSIS — I10 Essential (primary) hypertension: Secondary | ICD-10-CM

## 2016-12-06 DIAGNOSIS — K5902 Outlet dysfunction constipation: Secondary | ICD-10-CM | POA: Diagnosis not present

## 2016-12-06 DIAGNOSIS — K529 Noninfective gastroenteritis and colitis, unspecified: Secondary | ICD-10-CM | POA: Diagnosis not present

## 2016-12-06 MED ORDER — CHLORTHALIDONE 25 MG PO TABS: 25.0000 mg | ORAL_TABLET | Freq: Every day | ORAL | 3 refills | Status: DC

## 2016-12-06 NOTE — Patient Instructions (Addendum)
Your physician has recommended you make the following change in your medication:   1.) start new prescription for chlorthalidone 25 mg sent to your pharmacy. Take as directed on the bottle.  Your physician wants you to follow-up in: 1 year or sooner if needed. You will receive a reminder letter in the mail two months in advance. If you don't receive a letter, please call our office to schedule the follow-up appointment.  If you need a refill on your cardiac medications before your next appointment, please call your pharmacy.

## 2016-12-20 DIAGNOSIS — I10 Essential (primary) hypertension: Secondary | ICD-10-CM | POA: Diagnosis not present

## 2016-12-20 DIAGNOSIS — M25532 Pain in left wrist: Secondary | ICD-10-CM | POA: Diagnosis not present

## 2016-12-20 DIAGNOSIS — M25571 Pain in right ankle and joints of right foot: Secondary | ICD-10-CM | POA: Diagnosis not present

## 2016-12-20 DIAGNOSIS — Z6822 Body mass index (BMI) 22.0-22.9, adult: Secondary | ICD-10-CM | POA: Diagnosis not present

## 2016-12-22 ENCOUNTER — Ambulatory Visit: Payer: Medicare Other | Attending: Internal Medicine | Admitting: Physical Therapy

## 2016-12-22 ENCOUNTER — Encounter: Payer: Self-pay | Admitting: Physical Therapy

## 2016-12-22 DIAGNOSIS — R278 Other lack of coordination: Secondary | ICD-10-CM | POA: Insufficient documentation

## 2016-12-22 DIAGNOSIS — M6281 Muscle weakness (generalized): Secondary | ICD-10-CM

## 2016-12-22 DIAGNOSIS — M62838 Other muscle spasm: Secondary | ICD-10-CM | POA: Insufficient documentation

## 2016-12-22 NOTE — Therapy (Signed)
[facility] [address] [place], [place], [address] Phone: [phone]   Fax:  [phone]  Physical Therapy Treatment  Patient Details  Name: Carolyn Berger MRN: [id number] Date of Birth: December 08, 1945 Referring Provider: Dr. Milas Hock  Encounter Date: 12/22/2016      PT End of Session - 12/22/16 1100    Visit Number 5   Number of Visits 10   Date for PT Re-Evaluation 01/31/17   Authorization Type medicare g-code on 10th visit; Kx modifier on 15th   PT Start Time 1015   PT Stop Time 1058   PT Time Calculation (min) 43 min   Activity Tolerance Patient tolerated treatment well   Behavior During Therapy [facility] for tasks assessed/performed      Past Medical History:  Diagnosis Date  . Allergy    seasonal allergies, presently upper respiratory symptoms"runny nose, post nasal drip, rare cough_tx. Zpack  . Cataract   . GERD (gastroesophageal reflux disease)    controls with diet and rare OTC meds  . Heart murmur   . Mitral valve prolapse    with moderate mitral regurgitation-LOV Dr. Percival Spanish 10-22-13    Past Surgical History:  Procedure Laterality Date  . ANTERIOR CRUCIATE LIGAMENT REPAIR Left   . BREAST BIOPSY     of benign lesions  . CHOLECYSTECTOMY N/A 08/02/2014   Procedure: LAPAROSCOPIC CHOLECYSTECTOMY WITH INTRAOPERATIVE CHOLANGIOGRAM;  Surgeon: Pedro Earls, MD;  [facility]: [facility];  Service: General;  Laterality: N/A;  . minicus Right   . SPINE SURGERY  08/13/2016   fused C4-5  . TEE WITHOUT CARDIOVERSION  07/23/2011   Procedure: TRANSESOPHAGEAL ECHOCARDIOGRAM (TEE);  Surgeon: Loralie Champagne, MD;  Location: [place];  Service: Cardiovascular;  Laterality: N/A;  . TUBAL LIGATION      There were no vitals filed for this visit.      Subjective Assessment - 12/22/16 1017    Subjective I have a consult with my doctor for my left wrist and right ankle. During my trip, I was slightly  constipated and reduced stool leakage.  I was progressing prior to [place].  I am not getting up at night. instead of 8-9 times per night.  I have seen Dr. Micah Noel and he is happy with my progress.    Patient Stated Goals reduce sudden urgency, cramping, and # of bowel movements   Currently in Pain? No/denies                         [facility] Adult PT Treatment/Exercise - 12/22/16 0001      Self-Care   Self-Care Other Self-Care Comments   Other Self-Care Comments  instructed patient on the urge to void     Manual Therapy   Manual Therapy Myofascial release;Soft tissue mobilization   Soft tissue mobilization abdominal massage to improve tissue mobility and intestinal mobility to improve constipation; along the bil. dipahgram   Myofascial Release release the bladder from the vaginal and sac of douglas; release along the umbilicus through the 3 planes of fascia                PT Education - 12/22/16 1059    Education Details urge to void with bowel movement   Person(s) Educated Patient   Methods Explanation;Handout   Comprehension Verbalized understanding;Returned demonstration          PT Short Term Goals - 11/17/16 1404      PT SHORT TERM GOAL #  2   Title understand how to perfrom abdominal massage to promote peristalic movement of the intestines   Time 4   Period Weeks   Status Achieved           PT Long Term Goals - 12/22/16 1023      PT LONG TERM GOAL #1   Title Pt will be independent with advanced HEP   Baseline reinforced stretches and to focus on awareness of upper traps during functional movements   Time 8   Period Weeks   Status New     PT LONG TERM GOAL #2   Title FOTO < or += to 35% limitation   Baseline 46% limited   Time 8   Period Weeks   Status New     PT LONG TERM GOAL #3   Title pain prior to bowel movement decreased >/= 50% due to improved movement of the stool   Time 8   Period Weeks   Status Achieved  80% better      PT LONG TERM GOAL #4   Title ability to have 3</= bowel movements due to reduction in constipation    Time 8   Period Weeks   Status On-going  10-12 bowel movements  with diahrrhea     PT LONG TERM GOAL #5   Title understand how to relax the anal area during a bowel movement to have a complete bowel movement   Time 8   Period Weeks   Status Achieved               Plan - 12/22/16 1100    Clinical Impression Statement Patient had increased in intestinal sounds during fascial work.  Patient had decreased in swelling of abdomen after manual work.  Patient is still having trouble with urge to void and frequent bowel movements.  Patient does not have to get up in the night to have a bowel movement.  Patient will beenfit from killed therapy to improve pelvic floor strength and coordination.    Rehab Potential Excellent   Clinical Impairments Affecting Rehab Potential s/p cervical fusion 08/13/2016   PT Frequency 1x / week   PT Duration 8 weeks   PT Treatment/Interventions Biofeedback;Therapeutic activities;Therapeutic exercise;Neuromuscular re-education;Patient/family education;Manual techniques   PT Next Visit Plan lower abdominal massage, pelvic floor EMG; work on pelvic floor control and contraction more than 10 seconds; urge to void and frequent bowel movements   PT Home Exercise Plan progress as needed   Recommended Other Services recert signed 01/05/8298   Consulted and Agree with Plan of Care Patient      Patient will benefit from skilled therapeutic intervention in order to improve the following deficits and impairments:  Decreased coordination, Increased fascial restricitons, Pain, Decreased strength, Increased muscle spasms  Visit Diagnosis: Muscle weakness (generalized)  Other lack of coordination  Other muscle spasm     Problem List Patient Active Problem List   Diagnosis Date Noted  . Essential hypertension 12/06/2016  . Genetic testing 10/09/2014  . History of  laparoscopic cholecystectomy 08/02/2014  . Psoriasis 10/02/2012  . Dyspnea 07/01/2011  . HYPERLIPIDEMIA 12/23/2008  . MITRAL REGURGITATION 12/20/2008  . Mitral valve disorder 12/20/2008    Earlie Counts, PT 12/22/16 11:03 AM   [facility] [address] [place], [place], [address] Phone: [phone]   Fax:  [phone]  Name: Carolyn Berger MRN: [id number] Date of Birth: Dec 20, 1945

## 2016-12-22 NOTE — Patient Instructions (Signed)
Relaxation Exercises with the Urge to Void   When you experience an urge to void:  FIRST  Stop and stand very still    Sit down if you can    Don't move    You need to stay very still to maintain control  SECOND Squeeze your pelvic floor muscles 5 times, like a quick flick, to keep from leaking  THIRD Relax  Take a deep breath and then let it out  Try to make the urge go away by using relaxation and visualization techniques  FINALLY When you feel the urge go away somewhat, walk normally to the bathroom.   If the urge gets suddenly stronger on the way, you may stop again and relax to regain control.  [facility] [address], [address] [place], [place] [address] Phone # [phone] Fax [phone]  

## 2016-12-24 DIAGNOSIS — M654 Radial styloid tenosynovitis [de Quervain]: Secondary | ICD-10-CM | POA: Diagnosis not present

## 2016-12-27 DIAGNOSIS — S9031XA Contusion of right foot, initial encounter: Secondary | ICD-10-CM | POA: Diagnosis not present

## 2017-01-17 ENCOUNTER — Encounter: Payer: Self-pay | Admitting: Physical Therapy

## 2017-01-17 ENCOUNTER — Ambulatory Visit: Payer: Medicare Other | Attending: Internal Medicine | Admitting: Physical Therapy

## 2017-01-17 DIAGNOSIS — M6281 Muscle weakness (generalized): Secondary | ICD-10-CM | POA: Insufficient documentation

## 2017-01-17 DIAGNOSIS — R278 Other lack of coordination: Secondary | ICD-10-CM | POA: Diagnosis not present

## 2017-01-17 DIAGNOSIS — M62838 Other muscle spasm: Secondary | ICD-10-CM | POA: Insufficient documentation

## 2017-01-17 NOTE — Therapy (Signed)
[facility] [address] [place], [place], [address] Phone: [phone]   Fax:  [phone]  Physical Therapy Treatment  Patient Details  Name: Oaklyn Jakubek MRN: [id number] Date of Birth: [birth year] Referring Provider: Dr. Milas Hock  Encounter Date: 01/17/2017      PT End of Session - 01/17/17 1226    Visit Number 6   Number of Visits 10   Date for PT Re-Evaluation 01/31/17   Authorization Type medicare g-code on 10th visit; Kx modifier on 15th   PT Start Time 1144   PT Stop Time 1225   PT Time Calculation (min) 41 min   Activity Tolerance Patient tolerated treatment well   Behavior During Therapy [facility] for tasks assessed/performed      Past Medical History:  Diagnosis Date  . Allergy    seasonal allergies, presently upper respiratory symptoms"runny nose, post nasal drip, rare cough_tx. Zpack  . Cataract   . GERD (gastroesophageal reflux disease)    controls with diet and rare OTC meds  . Heart murmur   . Mitral valve prolapse    with moderate mitral regurgitation-LOV Dr. Percival Spanish 10-22-13    Past Surgical History:  Procedure Laterality Date  . ANTERIOR CRUCIATE LIGAMENT REPAIR Left   . BREAST BIOPSY     of benign lesions  . CHOLECYSTECTOMY N/A 08/02/2014   Procedure: LAPAROSCOPIC CHOLECYSTECTOMY WITH INTRAOPERATIVE CHOLANGIOGRAM;  Surgeon: Pedro Earls, MD;  [facility];  Service: General;  Laterality: N/A;  . minicus Right   . SPINE SURGERY  08/13/2016   fused C4-5  . TEE WITHOUT CARDIOVERSION  07/23/2011   Procedure: TRANSESOPHAGEAL ECHOCARDIOGRAM (TEE);  Surgeon: Loralie Champagne, MD;  Location: [place];  Service: Cardiovascular;  Laterality: N/A;  . TUBAL LIGATION      There were no vitals filed for this visit.      Subjective Assessment - 01/17/17 1148    Subjective The diarrehea is not there at night.  The mornings are rough due to bowel movements.  I am not able to go  out in the morning due to bowel movement for 3 years. No change in the morning.    Patient Stated Goals reduce sudden urgency, cramping, and # of bowel movements   Currently in Pain? No/denies                         [facility] Adult PT Treatment/Exercise - 01/17/17 0001      Self-Care   Self-Care Other Self-Care Comments   Other Self-Care Comments  discussion on how to manage the urge to have a bowel movement and making the bowels firmer     Manual Therapy   Manual Therapy Myofascial release;Soft tissue mobilization   Soft tissue mobilization abdominal massage to improve tissue mobility and intestinal mobility to improve constipation; along the bil. dipahgram   Myofascial Release release the bladder from the vaginal and sac of douglas; release along the umbilicus through the 3 planes of fascia                PT Education - 01/17/17 1225    Education provided Yes   Education Details Ways to manage the urge of bathrooming in the morning   Person(s) Educated Patient   Methods Explanation   Comprehension Verbalized understanding          PT Short Term Goals - 11/17/16 1404      PT SHORT TERM GOAL #2  Title understand how to perfrom abdominal massage to promote peristalic movement of the intestines   Time 4   Period Weeks   Status Achieved           PT Long Term Goals - 01/17/17 1228      PT LONG TERM GOAL #1   Title Pt will be independent with advanced HEP   Time 8   Period Weeks   Status On-going     PT LONG TERM GOAL #2   Title FOTO < or += to 35% limitation   Time 8   Period Weeks   Status On-going     PT LONG TERM GOAL #3   Title pain prior to bowel movement decreased >/= 50% due to improved movement of the stool   Time 8   Period Weeks   Status Achieved     PT LONG TERM GOAL #4   Title ability to have 3</= bowel movements due to reduction in constipation    Baseline usually does 6 hours of practice and 6 hours of classes -  currenlty able to do about half of that   Period Weeks   Status On-going     PT LONG TERM GOAL #5   Title understand how to relax the anal area during a bowel movement to have a complete bowel movement   Time 8   Period Weeks   Status Achieved               Plan - 01/17/17 1227    Clinical Impression Statement Patient does not have the frequent bowels in the night but she does have it in the morning.  Patient abdomen was bloated and after therapy it decreased wiht good bowel sounds.  Patient will benefit from skilled therapy to improve pelvic floor strength and coordination.    Rehab Potential Excellent   Clinical Impairments Affecting Rehab Potential s/p cervical fusion 08/13/2016   PT Frequency 1x / week   PT Duration 8 weeks   PT Treatment/Interventions Biofeedback;Therapeutic activities;Therapeutic exercise;Neuromuscular re-education;Patient/family education;Manual techniques   PT Next Visit Plan lower abdominal massage, pelvic floor EMG; work on pelvic floor control and contraction more than 10 seconds; urge to void and frequent bowel movements   PT Home Exercise Plan progress as needed   Consulted and Agree with Plan of Care Patient      Patient will benefit from skilled therapeutic intervention in order to improve the following deficits and impairments:  Decreased coordination, Increased fascial restricitons, Pain, Decreased strength, Increased muscle spasms  Visit Diagnosis: Muscle weakness (generalized)  Other lack of coordination  Other muscle spasm     Problem List Patient Active Problem List   Diagnosis Date Noted  . Essential hypertension 12/06/2016  . Genetic testing 10/09/2014  . History of laparoscopic cholecystectomy 08/02/2014  . Psoriasis 10/02/2012  . Dyspnea 07/01/2011  . HYPERLIPIDEMIA 12/23/2008  . MITRAL REGURGITATION 12/20/2008  . Mitral valve disorder 12/20/2008    Earlie Counts, PT 01/17/17 12:30 PM   [facility] [address], [place], [place], [address] Phone: [phone]   Fax:  [phone]  Name: Doha Boling MRN: [id number] Date of Birth: Feb 22, 1946

## 2017-01-17 NOTE — Patient Instructions (Signed)
Take Imodium starting today.  When you have an urge to have a bowel movement, do the urge to void exercise to make into 2 bowel movements instead of three.  Monitor the level of cramping .   First start with abdominal massage in the morning for 2-3 days.  If no changes then do the above exercise.   [facility] [address], [place], [place] [address] Phone # [phone] Fax [phone]

## 2017-01-21 DIAGNOSIS — M654 Radial styloid tenosynovitis [de Quervain]: Secondary | ICD-10-CM | POA: Diagnosis not present

## 2017-01-24 ENCOUNTER — Ambulatory Visit: Payer: Medicare Other | Admitting: Physical Therapy

## 2017-01-24 ENCOUNTER — Encounter: Payer: Self-pay | Admitting: Physical Therapy

## 2017-01-24 DIAGNOSIS — M62838 Other muscle spasm: Secondary | ICD-10-CM

## 2017-01-24 DIAGNOSIS — M6281 Muscle weakness (generalized): Secondary | ICD-10-CM | POA: Diagnosis not present

## 2017-01-24 DIAGNOSIS — R278 Other lack of coordination: Secondary | ICD-10-CM

## 2017-01-24 NOTE — Therapy (Addendum)
[facility] [address] [place], [place], [address] Phone: [phone]   Fax:  [phone]  Physical Therapy Treatment  Patient Details  Name: Carolyn Berger MRN: [id number] Date of Birth: 04-08-1946 Referring Provider: Dr. Milas Hock  Encounter Date: 01/24/2017      PT End of Session - 01/24/17 1149    Visit Number 7   Number of Visits 10   Date for PT Re-Evaluation 03/28/17   Authorization Type medicare g-code on 10th visit; Kx modifier on 15th   PT Start Time 1145   PT Stop Time 1225   PT Time Calculation (min) 40 min   Activity Tolerance Patient tolerated treatment well   Behavior During Therapy [facility] for tasks assessed/performed      Past Medical History:  Diagnosis Date  . Allergy    seasonal allergies, presently upper respiratory symptoms"runny nose, post nasal drip, rare cough_tx. Zpack  . Cataract   . GERD (gastroesophageal reflux disease)    controls with diet and rare OTC meds  . Heart murmur   . Mitral valve prolapse    with moderate mitral regurgitation-LOV Dr. Percival Spanish 10-22-13    Past Surgical History:  Procedure Laterality Date  . ANTERIOR CRUCIATE LIGAMENT REPAIR Left   . BREAST BIOPSY     of benign lesions  . CHOLECYSTECTOMY N/A 08/02/2014   Procedure: LAPAROSCOPIC CHOLECYSTECTOMY WITH INTRAOPERATIVE CHOLANGIOGRAM;  Surgeon: Pedro Earls, MD;  [facility];  Service: General;  Laterality: N/A;  . minicus Right   . SPINE SURGERY  08/13/2016   fused C4-5  . TEE WITHOUT CARDIOVERSION  07/23/2011   Procedure: TRANSESOPHAGEAL ECHOCARDIOGRAM (TEE);  Surgeon: Loralie Champagne, MD;  Location: [place];  Service: Cardiovascular;  Laterality: N/A;  . TUBAL LIGATION      There were no vitals filed for this visit.      Subjective Assessment - 01/24/17 1147    Subjective I have been doing the abdominal massage when I first waking up.  I am not having as much urgency and  cramping. I only had 2 bowel movements today. Cramping is 50% better. Since the cramping is getting better the urge to have a bowel movement is better.    Patient Stated Goals reduce sudden urgency, cramping, and # of bowel movements   Currently in Pain? No/denies   Multiple Pain Sites No            [facility] PT Assessment - 01/24/17 0001      Assessment   Medical Diagnosis K59.02 constipation by outlet dysfunction; M62.89 Pelvic  floor dysfunction   Onset Date/Surgical Date 02/06/15   Prior Therapy None     Precautions   Precautions Cervical   Precaution Comments No karate, no lifitng anything heavy     Restrictions   Weight Bearing Restrictions No     [place] residence     Prior Function   Level of [place] Retired     Associate Professor   Overall Cognitive Status Within Functional Limits for tasks assessed     Observation/Other Assessments   Focus on Therapeutic Outcomes (FOTO)  40% limitation for bowel survey  goal is 35% limitation     Strength   Overall Strength Comments bil. hip strength is 5/5; abdominal strength is 3/5     Palpation   SI assessment  pelvis in correct alignment     Transfers   Transfers Not assessed  Ambulation/Gait   Ambulation/Gait No      g-code: functional assessment tool used is FOTO score 40% limitation: functional limitation is other PT primary; goal status is CJ; discharge status is CK.  Earlie Counts, PT 04/20/17 4:58 PM              Pelvic Floor Special Questions - 01/24/17 0001    Strength weak squeeze, no lift           [facility] Adult PT Treatment/Exercise - 01/24/17 0001      Manual Therapy   Manual Therapy Myofascial release;Soft tissue mobilization   Soft tissue mobilization abdominal massage to improve tissue mobility and intestinal mobility to improve constipation; along the bil. dipahgram   Myofascial Release release the bladder from the vaginal  and sac of douglas; release along the umbilicus through the 3 planes of fascia                PT Education - 01/24/17 1215    Education provided Yes   Education Details continue with 6 second hold, continue with abdominal massage; abdominal strength   Person(s) Educated Patient   Methods Explanation   Comprehension Verbalized understanding          PT Short Term Goals - 11/17/16 1404      PT SHORT TERM GOAL #2   Title understand how to perfrom abdominal massage to promote peristalic movement of the intestines   Time 4   Period Weeks   Status Achieved           PT Long Term Goals - 01/24/17 1228      PT LONG TERM GOAL #1   Title Pt will be independent with advanced HEP   Baseline --   Time 8   Period Weeks   Status On-going  still learning     PT LONG TERM GOAL #2   Title FOTO < or += to 35% limitation   Baseline --   Time 8   Period Weeks   Status On-going     PT LONG TERM GOAL #3   Title pain prior to bowel movement decreased >/= 50% due to improved movement of the stool   Time 8   Period Weeks   Status Achieved     PT LONG TERM GOAL #4   Title ability to have 3</= bowel movements due to reduction in constipation    Baseline --   Time 8   Period Weeks   Status On-going  today was 2 bowel movements     PT LONG TERM GOAL #5   Title understand how to relax the anal area during a bowel movement to have a complete bowel movement   Time 8   Period Weeks   Status Achieved               Plan - 01/24/17 1147    Clinical Impression Statement Urge to void and abdominal cramping has improved by 50% since patient has been doing the abdominal massage in the morning.  Patient has improved bowel movements and only 2 today.  Patient is only able to contract the pelvic floor for 6 second. Patient has increased bilateral hip strength.  Patient has difficulty contracting the abdominals due to holding her breath and not bracing. Patient will benefit from  skilled therapy to redcue urge to void and number of bawel movements.    Rehab Potential Excellent   Clinical Impairments Affecting Rehab Potential s/p cervical fusion 08/13/2016   PT Frequency  1x / week   PT Duration 8 weeks   PT Treatment/Interventions Biofeedback;Therapeutic activities;Therapeutic exercise;Neuromuscular re-education;Patient/family education;Manual techniques   PT Next Visit Plan lower abdominal massage, pelvic floor EMG; work on pelvic floor control and contraction more than 10 seconds; urge to void and frequent bowel movements; see patient in 3-4 weeks   PT Home Exercise Plan progress as needed   Recommended Other Services recert sent on 07/13/154   Consulted and Agree with Plan of Care Patient      Patient will benefit from skilled therapeutic intervention in order to improve the following deficits and impairments:  Decreased coordination, Increased fascial restricitons, Pain, Decreased strength, Increased muscle spasms  Visit Diagnosis: Muscle weakness (generalized) - Plan: PT plan of care cert/re-cert  Other lack of coordination - Plan: PT plan of care cert/re-cert  Other muscle spasm - Plan: PT plan of care cert/re-cert     Problem List Patient Active Problem List   Diagnosis Date Noted  . Essential hypertension 12/06/2016  . Genetic testing 10/09/2014  . History of laparoscopic cholecystectomy 08/02/2014  . Psoriasis 10/02/2012  . Dyspnea 07/01/2011  . HYPERLIPIDEMIA 12/23/2008  . MITRAL REGURGITATION 12/20/2008  . Mitral valve disorder 12/20/2008    Earlie Counts, PT 01/24/17 12:31 PM   [facility] [address] [place], [place], [address] Phone: [phone]   Fax:  [phone]  Name: Shacoria Latif MRN: [id number] Date of Birth: 01/31/1946  PHYSICAL THERAPY DISCHARGE SUMMARY  Visits from Start of Care: 7  Current functional level related to goals / functional outcomes: See  above.    Remaining deficits: See above. Unable to assess patient due to not returning to therapy.   Education / Equipment: HEP Plan:                                                    Patient goals were not met. Patient is being discharged due to not returning since the last visit.  Thank you for the referral. Earlie Counts, PT 04/20/17 5:01 PM  ?????

## 2017-01-24 NOTE — Patient Instructions (Addendum)
Bracing With Curl-Up (Hook-Lying)    With neutral spine, tighten pelvic floor and abdominals and hold. Lift head and shoulders. Repeat 10___ times. Do _1__ times a day. Alternate arm positions: Fold arms across chest. Support neck with clasped hands.    Copyright  VHI. All rights reserved.    Bracing With Diagonal Curl-Up (Hook-Lying)    With neutral spine, tighten pelvic floor and abdominals and hold. Lift head and shoulder and rotate to one side. Repeat _10__ times, alternating sides. Do _1__ times a day. Alternate arm positions: Fold arms across chest. Support neck with clasped hands.   Copyright  VHI. All rights reserved.  [facility] [address], [place], [place] [address] Phone # [phone] Fax [phone]

## 2017-04-21 DIAGNOSIS — Z1239 Encounter for other screening for malignant neoplasm of breast: Secondary | ICD-10-CM | POA: Diagnosis not present

## 2017-04-21 DIAGNOSIS — R921 Mammographic calcification found on diagnostic imaging of breast: Secondary | ICD-10-CM | POA: Diagnosis not present

## 2017-04-21 DIAGNOSIS — R922 Inconclusive mammogram: Secondary | ICD-10-CM | POA: Diagnosis not present

## 2017-05-02 DIAGNOSIS — M654 Radial styloid tenosynovitis [de Quervain]: Secondary | ICD-10-CM | POA: Diagnosis not present

## 2017-05-03 DIAGNOSIS — H43813 Vitreous degeneration, bilateral: Secondary | ICD-10-CM | POA: Diagnosis not present

## 2017-05-03 DIAGNOSIS — Z961 Presence of intraocular lens: Secondary | ICD-10-CM | POA: Diagnosis not present

## 2017-05-17 DIAGNOSIS — M654 Radial styloid tenosynovitis [de Quervain]: Secondary | ICD-10-CM | POA: Diagnosis not present

## 2017-05-23 DIAGNOSIS — K529 Noninfective gastroenteritis and colitis, unspecified: Secondary | ICD-10-CM | POA: Diagnosis not present

## 2017-05-23 DIAGNOSIS — K5900 Constipation, unspecified: Secondary | ICD-10-CM | POA: Diagnosis not present

## 2017-05-23 DIAGNOSIS — Z23 Encounter for immunization: Secondary | ICD-10-CM | POA: Diagnosis not present

## 2017-05-24 DIAGNOSIS — M654 Radial styloid tenosynovitis [de Quervain]: Secondary | ICD-10-CM | POA: Diagnosis not present

## 2017-06-28 DIAGNOSIS — R7301 Impaired fasting glucose: Secondary | ICD-10-CM | POA: Diagnosis not present

## 2017-06-28 DIAGNOSIS — M859 Disorder of bone density and structure, unspecified: Secondary | ICD-10-CM | POA: Diagnosis not present

## 2017-06-28 DIAGNOSIS — R82998 Other abnormal findings in urine: Secondary | ICD-10-CM | POA: Diagnosis not present

## 2017-06-28 DIAGNOSIS — I10 Essential (primary) hypertension: Secondary | ICD-10-CM | POA: Diagnosis not present

## 2017-06-28 DIAGNOSIS — E7849 Other hyperlipidemia: Secondary | ICD-10-CM | POA: Diagnosis not present

## 2017-07-05 DIAGNOSIS — R7301 Impaired fasting glucose: Secondary | ICD-10-CM | POA: Diagnosis not present

## 2017-07-05 DIAGNOSIS — M545 Low back pain: Secondary | ICD-10-CM | POA: Diagnosis not present

## 2017-07-05 DIAGNOSIS — Z1389 Encounter for screening for other disorder: Secondary | ICD-10-CM | POA: Diagnosis not present

## 2017-07-05 DIAGNOSIS — K5909 Other constipation: Secondary | ICD-10-CM | POA: Diagnosis not present

## 2017-07-05 DIAGNOSIS — M542 Cervicalgia: Secondary | ICD-10-CM | POA: Diagnosis not present

## 2017-07-05 DIAGNOSIS — M25571 Pain in right ankle and joints of right foot: Secondary | ICD-10-CM | POA: Diagnosis not present

## 2017-07-05 DIAGNOSIS — E7849 Other hyperlipidemia: Secondary | ICD-10-CM | POA: Diagnosis not present

## 2017-07-05 DIAGNOSIS — Z23 Encounter for immunization: Secondary | ICD-10-CM | POA: Diagnosis not present

## 2017-07-05 DIAGNOSIS — L408 Other psoriasis: Secondary | ICD-10-CM | POA: Diagnosis not present

## 2017-07-05 DIAGNOSIS — Z6822 Body mass index (BMI) 22.0-22.9, adult: Secondary | ICD-10-CM | POA: Diagnosis not present

## 2017-07-05 DIAGNOSIS — M859 Disorder of bone density and structure, unspecified: Secondary | ICD-10-CM | POA: Diagnosis not present

## 2017-07-05 DIAGNOSIS — M25532 Pain in left wrist: Secondary | ICD-10-CM | POA: Diagnosis not present

## 2017-07-05 DIAGNOSIS — Z0000 Encounter for general adult medical examination without abnormal findings: Secondary | ICD-10-CM | POA: Diagnosis not present

## 2017-07-06 DIAGNOSIS — M859 Disorder of bone density and structure, unspecified: Secondary | ICD-10-CM | POA: Diagnosis not present

## 2017-07-12 DIAGNOSIS — Z1212 Encounter for screening for malignant neoplasm of rectum: Secondary | ICD-10-CM | POA: Diagnosis not present

## 2017-10-12 DIAGNOSIS — M25551 Pain in right hip: Secondary | ICD-10-CM | POA: Diagnosis not present

## 2017-10-18 DIAGNOSIS — Z803 Family history of malignant neoplasm of breast: Secondary | ICD-10-CM | POA: Diagnosis not present

## 2017-10-25 DIAGNOSIS — M25551 Pain in right hip: Secondary | ICD-10-CM | POA: Diagnosis not present

## 2017-10-25 DIAGNOSIS — M25561 Pain in right knee: Secondary | ICD-10-CM | POA: Diagnosis not present

## 2017-10-26 DIAGNOSIS — L400 Psoriasis vulgaris: Secondary | ICD-10-CM | POA: Diagnosis not present

## 2017-10-26 DIAGNOSIS — L298 Other pruritus: Secondary | ICD-10-CM | POA: Diagnosis not present

## 2017-11-14 DIAGNOSIS — M1611 Unilateral primary osteoarthritis, right hip: Secondary | ICD-10-CM | POA: Diagnosis not present

## 2017-11-14 DIAGNOSIS — M25551 Pain in right hip: Secondary | ICD-10-CM | POA: Diagnosis not present

## 2017-11-28 ENCOUNTER — Other Ambulatory Visit: Payer: Self-pay | Admitting: Cardiology

## 2017-11-28 NOTE — Telephone Encounter (Signed)
Rx request sent to pharmacy.  

## 2017-11-29 ENCOUNTER — Telehealth: Payer: Self-pay

## 2017-11-29 NOTE — Telephone Encounter (Signed)
   [facility] Pre-operative Risk Assessment    Request for surgical clearance:  1. What type of surgery is being performed? RIGHT TOTAL HIP ARTHROPLASTY  2. When is this surgery scheduled? 01/24/18   3. What type of clearance is required (medical clearance vs. Pharmacy clearance to hold med vs. Both)? BOTH  4. Are there any medications that need to be held prior to surgery and how long? PLEASE ADVISE ANY MEDICATIONS THAT NEED TO BE HELD.   5. Practice name and name of physician performing surgery? [facility]   6. What is your office phone number [phone]    7.   What is your office fax number [phone]  8.   Anesthesia type (None, local, MAC, general) ? SPINAL   Jacinta Shoe 11/29/2017, 2:00 PM  _________________________________________________________________   (provider comments below)

## 2017-11-29 NOTE — Telephone Encounter (Signed)
   Primary Cardiologist:James Hochrein, MD  Chart reviewed as part of pre-operative protocol coverage. Because of Carolyn Berger's past medical history and time since last visit, he/she will require a follow-up visit in order to better assess preoperative cardiovascular risk. By the time of surgery will have been >1 year from last visit. Has appointment 01/09/18 with Dr. Percival Spanish so can use this appt to discuss clearance.  Pre-op covering staff: - Please inform patient - Add preop clearance to appt notes - Please contact requesting surgeon's office via preferred method (i.e, phone, fax) to inform them of need for appointment prior to surgery.  Carolyn Pitter, PA-C  11/29/2017, 5:05 PM

## 2017-11-30 NOTE — Telephone Encounter (Signed)
Spoke with pt re: surgical clearance request.  Pt has been made aware that at her o/v 01/09/18, it will addressed.   [facility] has been made aware as well.  Pt thanked me for the call.

## 2017-12-07 ENCOUNTER — Telehealth: Payer: Self-pay | Admitting: Cardiology

## 2017-12-07 DIAGNOSIS — E7849 Other hyperlipidemia: Secondary | ICD-10-CM | POA: Diagnosis not present

## 2017-12-07 DIAGNOSIS — I10 Essential (primary) hypertension: Secondary | ICD-10-CM | POA: Diagnosis not present

## 2017-12-07 DIAGNOSIS — K219 Gastro-esophageal reflux disease without esophagitis: Secondary | ICD-10-CM | POA: Diagnosis not present

## 2017-12-07 DIAGNOSIS — K5909 Other constipation: Secondary | ICD-10-CM | POA: Diagnosis not present

## 2017-12-07 DIAGNOSIS — Z01818 Encounter for other preprocedural examination: Secondary | ICD-10-CM | POA: Diagnosis not present

## 2017-12-07 DIAGNOSIS — Z6822 Body mass index (BMI) 22.0-22.9, adult: Secondary | ICD-10-CM | POA: Diagnosis not present

## 2017-12-07 DIAGNOSIS — M1611 Unilateral primary osteoarthritis, right hip: Secondary | ICD-10-CM | POA: Diagnosis not present

## 2017-12-07 NOTE — Telephone Encounter (Signed)
Received records from [facility] on 12/07/17, Appt 01/09/18 @ 9:20 AM. NV

## 2018-01-07 NOTE — Progress Notes (Signed)
HPI The patient presents for followup of mitral valve prolapse.   Since I last saw her she has done well except for treatment of IBS and some injuries to her cervical spine and wrist.  She is to have a right total hip replacement.  She just one a karate tournament.  She is quite active.  The patient denies any new symptoms such as chest discomfort, neck or arm discomfort. There has been no new shortness of breath, PND or orthopnea. There have been no reported palpitations, presyncope or syncope.  Allergies  Allergen Reactions  . Typhoid Vaccines Rash  . Lobster [Shellfish Allergy] Nausea And Vomiting    Current Outpatient Medications  Medication Sig Dispense Refill  . atorvastatin (LIPITOR) 80 MG tablet Take 80 mg by mouth daily.    . chlorthalidone (HYGROTON) 25 MG tablet TAKE 1 TABLET(25 MG) BY MOUTH DAILY 90 tablet 0  . irbesartan (AVAPRO) 150 MG tablet TK 1 T PO D  2  . raloxifene (EVISTA) 60 MG tablet Take 60 mg by mouth every morning.     Marland Kitchen VAGIFEM 10 MCG TABS vaginal tablet Place 1 tablet vaginally daily.   4   No current facility-administered medications for this visit.     Past Medical History:  Diagnosis Date  . Allergy    seasonal allergies, presently upper respiratory symptoms"runny nose, post nasal drip, rare cough_tx. Zpack  . Cataract   . GERD (gastroesophageal reflux disease)    controls with diet and rare OTC meds  . Heart murmur   . Mitral valve prolapse    with moderate mitral regurgitation-LOV Dr. Percival Spanish 10-22-13    Past Surgical History:  Procedure Laterality Date  . ANTERIOR CRUCIATE LIGAMENT REPAIR Left   . BREAST BIOPSY     of benign lesions  . CHOLECYSTECTOMY N/A 08/02/2014   Procedure: LAPAROSCOPIC CHOLECYSTECTOMY WITH INTRAOPERATIVE CHOLANGIOGRAM;  Surgeon: Pedro Earls, MD;  Location: [facility];  Service: General;  Laterality: N/A;  . minicus Right   . SPINE SURGERY  08/13/2016   fused C4-5  . TEE WITHOUT CARDIOVERSION  07/23/2011   Procedure: TRANSESOPHAGEAL ECHOCARDIOGRAM (TEE);  Surgeon: Loralie Champagne, MD;  Location: [place];  Service: Cardiovascular;  Laterality: N/A;  . TUBAL LIGATION      ROS:  As stated in the HPI and negative for all other systems.  PHYSICAL EXAM BP 94/61   Pulse 70   Ht 5\' 5"  (1.651 m)   Wt 138 lb 6.4 oz (62.8 kg)   BMI 23.03 kg/m   GENERAL:  Well appearing NECK:  No jugular venous distention, waveform within normal limits, carotid upstroke brisk and symmetric, no bruits, no thyromegaly LUNGS:  Clear to auscultation bilaterally CHEST:  Unremarkable HEART:  PMI not displaced or sustained,S1 and S2 within normal limits, no S3, no S4, no clicks, no rubs, 3 out of 6 systolic murmur heard best at the lower left sternal border, no diastolic murmurs ABD:  Flat, positive bowel sounds normal in frequency in pitch, no bruits, no rebound, no guarding, no midline pulsatile mass, no hepatomegaly, no splenomegaly EXT:  2 plus pulses throughout, no edema, no cyanosis no clubbing   EKG:  Sinus rhythm, rate 70 .  Attention, axis leftward,  intervals within normal limits, no acute ST-T wave changes poor anterior R wave progression.   01/09/2018   ASSESSMENT AND PLAN  MITRAL REGURGITATION/AI:  These were mild/moderate.    AI:  I will follow with echo as above.   HTN:  Blood pressure was repeated and systolic was 702.  She will continue on the meds as listed.    PREOP:    The patient is not going for high risk procedure.  She has an exceptional activity level.  Has no high risk findings or features.  Therefore, according to ACC/AHA guidelines the patient is acceptable risk for planned surgery.

## 2018-01-09 ENCOUNTER — Ambulatory Visit (INDEPENDENT_AMBULATORY_CARE_PROVIDER_SITE_OTHER): Payer: Medicare Other | Admitting: Cardiology

## 2018-01-09 ENCOUNTER — Encounter: Payer: Self-pay | Admitting: Cardiology

## 2018-01-09 VITALS — BP 94/61 | HR 70 | Ht 65.0 in | Wt 138.4 lb

## 2018-01-09 DIAGNOSIS — I080 Rheumatic disorders of both mitral and aortic valves: Secondary | ICD-10-CM | POA: Diagnosis not present

## 2018-01-09 DIAGNOSIS — I351 Nonrheumatic aortic (valve) insufficiency: Secondary | ICD-10-CM | POA: Diagnosis not present

## 2018-01-09 DIAGNOSIS — I059 Rheumatic mitral valve disease, unspecified: Secondary | ICD-10-CM

## 2018-01-09 DIAGNOSIS — I10 Essential (primary) hypertension: Secondary | ICD-10-CM

## 2018-01-09 NOTE — Patient Instructions (Signed)
Medication Instructions:  Your physician recommends that you continue on your current medications as directed. Please refer to the Current Medication list given to you today.   Labwork: NONE  Testing/Procedures: Your physician has requested that you have an echocardiogram. Echocardiography is a painless test that uses sound waves to create images of your heart. It provides your doctor with information about the size and shape of your heart and how well your heart's chambers and valves are working. This procedure takes approximately one hour. There are no restrictions for this procedure.    Follow-Up: Your physician wants you to follow-up in: [place] DR. [place]. You will receive a reminder letter in the mail two months in advance. If you don't receive a letter, please call our office to schedule the follow-up appointment.   Any Other Special Instructions Will Be Listed Below (If Applicable).     If you need a refill on your cardiac medications before your next appointment, please call your pharmacy.

## 2018-01-11 ENCOUNTER — Other Ambulatory Visit: Payer: Self-pay | Admitting: Orthopedic Surgery

## 2018-01-11 NOTE — H&P (Signed)
TOTAL HIP ADMISSION H&P  Patient is admitted for right total hip arthroplasty, anterior approach .  Subjective:  Chief Complaint:   Right hip primary OA /pain  HPI: Carolyn Berger, 72 y.o. female, has a history of pain and functional disability in the right hip(s) due to arthritis and patient has failed non-surgical conservative treatments for greater than 12 weeks to include NSAID's and/or analgesics and activity modification.  Onset of symptoms was gradual starting ~1 year ago with gradually worsening course since that time.The patient noted no past surgery on the right hip(s).  Patient currently rates pain in the right hip at 7 out of 10 with activity. Patient has night pain, worsening of pain with activity and weight bearing, trendelenberg gait, pain that interfers with activities of daily living and pain with passive range of motion. Patient has evidence of periarticular osteophytes and joint space narrowing by imaging studies. This condition presents safety issues increasing the risk of falls.  There is no current active infection.  Risks, benefits and expectations were discussed with the patient.  Risks including but not limited to the risk of anesthesia, blood clots, nerve damage, blood vessel damage, failure of the prosthesis, infection and up to and including death.  Patient understand the risks, benefits and expectations and wishes to proceed with surgery.   PCP: Crist Infante, MD  D/C Plans:       Home  Post-op Meds:       No Rx given  Tranexamic Acid:      To be given - IV   Decadron:      Is to be given  FYI:      ASA  Norco  DME:   Pt already has equipment   PT:   No PT    Patient Active Problem List   Diagnosis Date Noted  . Essential hypertension 12/06/2016  . Genetic testing 10/09/2014  . History of laparoscopic cholecystectomy 08/02/2014  . Psoriasis 10/02/2012  . Dyspnea 07/01/2011  . HYPERLIPIDEMIA 12/23/2008  . MITRAL REGURGITATION 12/20/2008  . Mitral  valve disorder 12/20/2008   Past Medical History:  Diagnosis Date  . Allergy    seasonal allergies, presently upper respiratory symptoms"runny nose, post nasal drip, rare cough_tx. Zpack  . Cataract   . GERD (gastroesophageal reflux disease)    controls with diet and rare OTC meds  . Heart murmur   . Mitral valve prolapse    with moderate mitral regurgitation-LOV Dr. Percival Spanish 10-22-13    Past Surgical History:  Procedure Laterality Date  . ANTERIOR CRUCIATE LIGAMENT REPAIR Left   . BREAST BIOPSY     of benign lesions  . CHOLECYSTECTOMY N/A 08/02/2014   Procedure: LAPAROSCOPIC CHOLECYSTECTOMY WITH INTRAOPERATIVE CHOLANGIOGRAM;  Surgeon: Pedro Earls, MD;  Location: [facility];  Service: General;  Laterality: N/A;  . minicus Right   . SPINE SURGERY  08/13/2016   fused C4-5  . TEE WITHOUT CARDIOVERSION  07/23/2011   Procedure: TRANSESOPHAGEAL ECHOCARDIOGRAM (TEE);  Surgeon: Loralie Champagne, MD;  Location: [place];  Service: Cardiovascular;  Laterality: N/A;  . TUBAL LIGATION      No current facility-administered medications for this encounter.    Current Outpatient Medications  Medication Sig Dispense Refill Last Dose  . atorvastatin (LIPITOR) 80 MG tablet Take 80 mg by mouth daily.   Taking  . chlorthalidone (HYGROTON) 25 MG tablet TAKE 1 TABLET(25 MG) BY MOUTH DAILY 90 tablet 0 Taking  . irbesartan (AVAPRO) 150 MG tablet TK 1 T PO D  2 Taking  . raloxifene (EVISTA) 60 MG tablet Take 60 mg by mouth every morning.    Taking  . VAGIFEM 10 MCG TABS vaginal tablet Place 1 tablet vaginally daily.   4 Taking   Allergies  Allergen Reactions  . Typhoid Vaccines Rash  . Lobster [Shellfish Allergy] Nausea And Vomiting    Social History   Tobacco Use  . Smoking status: Never Smoker  . Smokeless tobacco: Never Used  Substance Use Topics  . Alcohol use: Yes    Comment: daily glass of wine    Family History  Problem Relation Age of Onset  . Cancer Mother        breast  . Cancer  Father        lymphoma  . Cancer Sister        uterine  . Cancer Sister        Breast CA     Review of Systems  Constitutional: Negative.   HENT: Negative.   Eyes: Negative.   Respiratory: Negative.   Cardiovascular: Negative.   Gastrointestinal: Negative.   Genitourinary: Negative.   Musculoskeletal: Positive for joint pain.  Skin: Negative.   Neurological: Negative.   Endo/Heme/Allergies: Negative.   Psychiatric/Behavioral: Negative.     Objective:  Physical Exam  Constitutional: She is oriented to person, place, and time. She appears well-developed.  HENT:  Head: Normocephalic.  Eyes: Pupils are equal, round, and reactive to light.  Neck: Neck supple. No JVD present. No tracheal deviation present. No thyromegaly present.  Cardiovascular: Normal rate, regular rhythm and intact distal pulses.  Murmur heard. Respiratory: Effort normal and breath sounds normal. No respiratory distress. She has no wheezes.  GI: Soft. There is no tenderness. There is no guarding.  Musculoskeletal:       Right hip: She exhibits decreased range of motion, decreased strength, tenderness and bony tenderness. She exhibits no swelling, no deformity and no laceration.  Lymphadenopathy:    She has no cervical adenopathy.  Neurological: She is alert and oriented to person, place, and time.  Skin: Skin is warm and dry.  Psychiatric: She has a normal mood and affect.      Labs:  Estimated body mass index is 23.03 kg/m as calculated from the following:   Height as of 01/09/18: 5\' 5"  (1.651 m).   Weight as of 01/09/18: 62.8 kg (138 lb 6.4 oz).   Imaging Review Plain radiographs demonstrate severe degenerative joint disease of the right hip(s). The bone quality appears to be good for age and reported activity level.    Preoperative templating of the joint replacement has been completed, documented, and submitted to the Operating Room personnel in order to optimize intra-operative equipment  management.     Assessment/Plan:  End stage arthritis, right hip  The patient history, physical examination, clinical judgement of the provider and imaging studies are consistent with end stage degenerative joint disease of the right hip and total hip arthroplasty is deemed medically necessary. The treatment options including medical management, injection therapy, arthroscopy and arthroplasty were discussed at length. The risks and benefits of total hip arthroplasty were presented and reviewed. The risks due to aseptic loosening, infection, stiffness, dislocation/subluxation,  thromboembolic complications and other imponderables were discussed.  The patient acknowledged the explanation, agreed to proceed with the plan and consent was signed. Patient is being admitted for inpatient treatment for surgery, pain control, PT, OT, prophylactic antibiotics, VTE prophylaxis, progressive ambulation and ADL's and discharge planning.The patient is planning to be discharged  home.     [place]. Zavian Slowey   PA-C  01/11/2018, 1:15 PM

## 2018-01-11 NOTE — Care Plan (Signed)
R THA scheduled on 01-24-18. DCP:  Home with spouse.  1 story home with 2 ste. DME:  No needs.  Borrowing a RW.  Doesn't need a 3-in-1. PT:  HEP

## 2018-01-12 ENCOUNTER — Telehealth: Payer: Self-pay

## 2018-01-12 NOTE — Telephone Encounter (Signed)
Copy of last OV notes and EKG sent to [facility] Attn: Derl Barrow for pt surgical clearance.

## 2018-01-19 NOTE — Progress Notes (Signed)
DR [facility] CARDIAC CLEARANCE 01-09-18 CHART AND Epic EKG 01-09-10 Epic AND ON CHART

## 2018-01-19 NOTE — Patient Instructions (Addendum)
Carolyn Berger  01/19/2018   Your procedure is scheduled on: 01-06-18  Report to [facility] Main  Entrance  Report to admitting at 900 AM    Call this number if you have problems the morning of surgery [phone]   Remember: Do not eat food or drink liquids :After Midnight.     Take these medicines the morning of surgery with A SIP OF WATER: ATORVASTATIN (LIPITOR), ZYRTEC                              You may not have any metal on your body including hair pins and              piercings  Do not wear jewelry, make-up, lotions, powders or perfumes, deodorant             Do not wear nail polish.  Do not shave  48 hours prior to surgery.              Men may shave face and neck.   Do not bring valuables to the hospital. [place].  Contacts, dentures or bridgework may not be worn into surgery.  Leave suitcase in the car. After surgery it may be brought to your room.                  Please read over the following fact sheets you were given: _____________________________________________________________________             [facility] - Preparing for Surgery Before surgery, you can play an important role.  Because skin is not sterile, your skin needs to be as free of germs as possible.  You can reduce the number of germs on your skin by washing with CHG (chlorahexidine gluconate) soap before surgery.  CHG is an antiseptic cleaner which kills germs and bonds with the skin to continue killing germs even after washing. Please DO NOT use if you have an allergy to CHG or antibacterial soaps.  If your skin becomes reddened/irritated stop using the CHG and inform your nurse when you arrive at Short Stay. Do not shave (including legs and underarms) for at least 48 hours prior to the first CHG shower.  You may shave your face/neck. Please follow these instructions carefully:  1.  Shower with CHG Soap the night before  surgery and the  morning of Surgery.  2.  If you choose to wash your hair, wash your hair first as usual with your  normal  shampoo.  3.  After you shampoo, rinse your hair and body thoroughly to remove the  shampoo.                           4.  Use CHG as you would any other liquid soap.  You can apply chg directly  to the skin and wash                       Gently with a scrungie or clean washcloth.  5.  Apply the CHG Soap to your body ONLY FROM THE NECK DOWN.   Do not use on face/ open  Wound or open sores. Avoid contact with eyes, ears mouth and genitals (private parts).                       Wash face,  Genitals (private parts) with your normal soap.             6.  Wash thoroughly, paying special attention to the area where your surgery  will be performed.  7.  Thoroughly rinse your body with warm water from the neck down.  8.  DO NOT shower/wash with your normal soap after using and rinsing off  the CHG Soap.                9.  Pat yourself dry with a clean towel.            10.  Wear clean pajamas.            11.  Place clean sheets on your bed the night of your first shower and do not  sleep with pets. Day of Surgery : Do not apply any lotions/deodorants the morning of surgery.  Please wear clean clothes to the hospital/surgery center.  FAILURE TO FOLLOW THESE INSTRUCTIONS MAY RESULT IN THE CANCELLATION OF YOUR SURGERY PATIENT SIGNATURE_________________________________  NURSE SIGNATURE__________________________________  ________________________________________________________________________   Adam Phenix  An incentive spirometer is a tool that can help keep your lungs clear and active. This tool measures how well you are filling your lungs with each breath. Taking long deep breaths may help reverse or decrease the chance of developing breathing (pulmonary) problems (especially infection) following:  A long period of time when you are unable to  move or be active. BEFORE THE PROCEDURE   If the spirometer includes an indicator to show your best effort, your nurse or respiratory therapist will set it to a desired goal.  If possible, sit up straight or lean slightly forward. Try not to slouch.  Hold the incentive spirometer in an upright position. INSTRUCTIONS FOR USE  1. Sit on the edge of your bed if possible, or sit up as far as you can in bed or on a chair. 2. Hold the incentive spirometer in an upright position. 3. Breathe out normally. 4. Place the mouthpiece in your mouth and seal your lips tightly around it. 5. Breathe in slowly and as deeply as possible, raising the piston or the ball toward the top of the column. 6. Hold your breath for 3-5 seconds or for as long as possible. Allow the piston or ball to fall to the bottom of the column. 7. Remove the mouthpiece from your mouth and breathe out normally. 8. Rest for a few seconds and repeat Steps 1 through 7 at least 10 times every 1-2 hours when you are awake. Take your time and take a few normal breaths between deep breaths. 9. The spirometer may include an indicator to show your best effort. Use the indicator as a goal to work toward during each repetition. 10. After each set of 10 deep breaths, practice coughing to be sure your lungs are clear. If you have an incision (the cut made at the time of surgery), support your incision when coughing by placing a pillow or rolled up towels firmly against it. Once you are able to get out of bed, walk around indoors and cough well. You may stop using the incentive spirometer when instructed by your caregiver.  RISKS AND COMPLICATIONS  Take your time so you do not get  dizzy or light-headed.  If you are in pain, you may need to take or ask for pain medication before doing incentive spirometry. It is harder to take a deep breath if you are having pain. AFTER USE  Rest and breathe slowly and easily.  It can be helpful to keep track of  a log of your progress. Your caregiver can provide you with a simple table to help with this. If you are using the spirometer at home, follow these instructions: [place] IF:   You are having difficultly using the spirometer.  You have trouble using the spirometer as often as instructed.  Your pain medication is not giving enough relief while using the spirometer.  You develop fever of 100.5 F (38.1 C) or higher. SEEK IMMEDIATE MEDICAL CARE IF:   You cough up bloody sputum that had not been present before.  You develop fever of 102 F (38.9 C) or greater.  You develop worsening pain at or near the incision site. MAKE SURE YOU:   Understand these instructions.  Will watch your condition.  Will get help right away if you are not doing well or get worse. Document Released: 10/04/2006 Document Revised: 08/16/2011 Document Reviewed: 12/05/2006 ExitCare Patient Information 2014 Exit[employer], [place].   ________________________________________________________________________  WHAT IS A BLOOD TRANSFUSION? Blood Transfusion Information  A transfusion is the replacement of blood or some of its parts. Blood is made up of multiple cells which provide different functions.  Red blood cells carry oxygen and are used for blood loss replacement.  White blood cells fight against infection.  Platelets control bleeding.  Plasma helps clot blood.  Other blood products are available for specialized needs, such as hemophilia or other clotting disorders. BEFORE THE TRANSFUSION  Who gives blood for transfusions?   Healthy volunteers who are fully evaluated to make sure their blood is safe. This is blood bank blood. Transfusion therapy is the safest it has ever been in the practice of medicine. Before blood is taken from a donor, a complete history is taken to make sure that person has no history of diseases nor engages in risky social behavior (examples are intravenous drug use or sexual  activity with multiple partners). The donor's travel history is screened to minimize risk of transmitting infections, such as malaria. The donated blood is tested for signs of infectious diseases, such as HIV and hepatitis. The blood is then tested to be sure it is compatible with you in order to minimize the chance of a transfusion reaction. If you or a relative donates blood, this is often done in anticipation of surgery and is not appropriate for emergency situations. It takes many days to process the donated blood. RISKS AND COMPLICATIONS Although transfusion therapy is very safe and saves many lives, the main dangers of transfusion include:   Getting an infectious disease.  Developing a transfusion reaction. This is an allergic reaction to something in the blood you were given. Every precaution is taken to prevent this. The decision to have a blood transfusion has been considered carefully by your caregiver before blood is given. Blood is not given unless the benefits outweigh the risks. AFTER THE TRANSFUSION  Right after receiving a blood transfusion, you will usually feel much better and more energetic. This is especially true if your red blood cells have gotten low (anemic). The transfusion raises the level of the red blood cells which carry oxygen, and this usually causes an energy increase.  The nurse administering the transfusion will  monitor you carefully for complications. HOME CARE INSTRUCTIONS  No special instructions are needed after a transfusion. You may find your energy is better. Speak with your caregiver about any limitations on activity for underlying diseases you may have. SEEK MEDICAL CARE IF:   Your condition is not improving after your transfusion.  You develop redness or irritation at the intravenous (IV) site. SEEK IMMEDIATE MEDICAL CARE IF:  Any of the following symptoms occur over the next 12 hours:  Shaking chills.  You have a temperature by mouth above 102 F  (38.9 C), not controlled by medicine.  Chest, back, or muscle pain.  People around you feel you are not acting correctly or are confused.  Shortness of breath or difficulty breathing.  Dizziness and fainting.  You get a rash or develop hives.  You have a decrease in urine output.  Your urine turns a dark color or changes to pink, red, or brown. Any of the following symptoms occur over the next 10 days:  You have a temperature by mouth above 102 F (38.9 C), not controlled by medicine.  Shortness of breath.  Weakness after normal activity.  The white part of the eye turns yellow (jaundice).  You have a decrease in the amount of urine or are urinating less often.  Your urine turns a dark color or changes to pink, red, or brown. Document Released: 05/21/2000 Document Revised: 08/16/2011 Document Reviewed: 01/08/2008 [facility] Patient Information 2014 [place], [place].  _______________________________________________________________________

## 2018-01-23 ENCOUNTER — Encounter (HOSPITAL_COMMUNITY): Payer: Self-pay

## 2018-01-23 ENCOUNTER — Encounter (HOSPITAL_COMMUNITY)
Admission: RE | Admit: 2018-01-23 | Discharge: 2018-01-23 | Disposition: A | Discharge status: Home / Self Care | Payer: Medicare Other | Source: Ambulatory Visit | Attending: Orthopedic Surgery | Admitting: Orthopedic Surgery

## 2018-01-23 ENCOUNTER — Other Ambulatory Visit: Payer: Self-pay

## 2018-01-23 HISTORY — DX: Unspecified osteoarthritis, unspecified site: M19.90

## 2018-01-23 HISTORY — DX: Other injury of unspecified body region, initial encounter: T14.8XXA

## 2018-01-23 HISTORY — DX: Essential (primary) hypertension: I10

## 2018-01-23 HISTORY — DX: Anemia, unspecified: D64.9

## 2018-01-23 LAB — BASIC METABOLIC PANEL
ANION GAP: 10 (ref 5–15)
BUN: 24 mg/dL — ABNORMAL HIGH (ref 8–23)
CO2: 28 mmol/L (ref 22–32)
Calcium: 10.6 mg/dL — ABNORMAL HIGH (ref 8.9–10.3)
Chloride: 105 mmol/L (ref 98–111)
Creatinine, Ser: 0.84 mg/dL (ref 0.44–1.00)
GFR calc Af Amer: >60 mL/min (ref >60)
Glucose, Bld: 108 mg/dL — ABNORMAL HIGH (ref 70–99)
POTASSIUM: 5.8 mmol/L — AB (ref 3.5–5.1)
Sodium: 143 mmol/L (ref 135–145)

## 2018-01-23 LAB — ABO/RH: ABO/RH(D): O NEG

## 2018-01-23 LAB — CBC
HEMATOCRIT: 41.5 % (ref 36.0–46.0)
Hemoglobin: 13.8 g/dL (ref 12.0–15.0)
MCH: 29.9 pg (ref 26.0–34.0)
MCHC: 33.3 g/dL (ref 30.0–36.0)
MCV: 89.8 fL (ref 78.0–100.0)
Platelets: 248 10*3/uL (ref 150–400)
RBC: 4.62 MIL/uL (ref 3.87–5.11)
RDW: 13.2 % (ref 11.5–15.5)
WBC: 9.4 10*3/uL (ref 4.0–10.5)

## 2018-01-23 LAB — SURGICAL PCR SCREEN
MRSA, PCR: NEGATIVE
STAPHYLOCOCCUS AUREUS: NEGATIVE

## 2018-01-23 NOTE — Anesthesia Preprocedure Evaluation (Addendum)
Anesthesia Evaluation  Patient identified by MRN, date of birth, ID band Patient awake    Reviewed: Allergy & Precautions, NPO status , Patient's Chart, lab work & pertinent test results  Airway Mallampati: II  TM Distance: >3 FB Neck ROM: Full    Dental no notable dental hx. (+) Teeth Intact, Dental Advisory Given   Pulmonary    Pulmonary exam normal breath sounds clear to auscultation       Cardiovascular Exercise Tolerance: Good hypertension, Pt. on medications Normal cardiovascular exam+ Valvular Problems/Murmurs MR  Rhythm:Regular Rate:Normal  ECHO 01/06/2016 Left ventricle: The cavity size was normal. There was mild   concentric hypertrophy. Systolic function was normal. The   estimated ejection fraction was in the range of 60% to 65%. Wall   motion was normal; there were no regional wall motion   abnormalities. Doppler parameters are consistent with abnormal   left ventricular relaxation (grade 1 diastolic dysfunction).   Doppler parameters are consistent with indeterminate ventricular   filling pressure.   Neuro/Psych negative neurological ROS  negative psych ROS   GI/Hepatic Neg liver ROS, GERD  ,  Endo/Other  negative endocrine ROS  Renal/GU negative Renal ROS  negative genitourinary   Musculoskeletal  (+) Arthritis ,   Abdominal   Peds  Hematology  (+) anemia ,   Anesthesia Other Findings R THA  Reproductive/Obstetrics                            Anesthesia Physical Anesthesia Plan  ASA: III  Anesthesia Plan: Spinal   Post-op Pain Management:    Induction:   PONV Risk Score and Plan: 2 and Treatment may vary due to age or medical condition, Ondansetron and Dexamethasone  Airway Management Planned: Natural Airway and Nasal Cannula  Additional Equipment:   Intra-op Plan:   Post-operative Plan:   Informed Consent: I have reviewed the patients History and Physical,  chart, labs and discussed the procedure including the risks, benefits and alternatives for the proposed anesthesia with the patient or authorized representative who has indicated his/her understanding and acceptance.   Dental advisory given  Plan Discussed with: CRNA  Anesthesia Plan Comments:         Anesthesia Quick Evaluation

## 2018-01-23 NOTE — Progress Notes (Signed)
MEDICAL CLEARANCE NOTE DR PERRINI  NO CHART AND LOV 12-07-17 DR PERRINI ON CHART

## 2018-01-24 ENCOUNTER — Inpatient Hospital Stay (HOSPITAL_COMMUNITY): Payer: Medicare Other | Admitting: Anesthesiology

## 2018-01-24 ENCOUNTER — Inpatient Hospital Stay (HOSPITAL_COMMUNITY): Payer: Medicare Other

## 2018-01-24 ENCOUNTER — Inpatient Hospital Stay (HOSPITAL_COMMUNITY)
Admission: RE | Admit: 2018-01-24 | Discharge: 2018-01-25 | DRG: 470 | Disposition: A | Discharge status: Home / Self Care | Payer: Medicare Other | Attending: Orthopedic Surgery | Admitting: Orthopedic Surgery

## 2018-01-24 ENCOUNTER — Other Ambulatory Visit: Payer: Self-pay

## 2018-01-24 ENCOUNTER — Encounter (HOSPITAL_COMMUNITY)
Admission: RE | Disposition: A | Discharge status: Home / Self Care | Payer: Self-pay | Source: Home / Self Care | Attending: Orthopedic Surgery

## 2018-01-24 ENCOUNTER — Encounter (HOSPITAL_COMMUNITY): Payer: Self-pay

## 2018-01-24 DIAGNOSIS — Z471 Aftercare following joint replacement surgery: Secondary | ICD-10-CM | POA: Diagnosis not present

## 2018-01-24 DIAGNOSIS — K219 Gastro-esophageal reflux disease without esophagitis: Secondary | ICD-10-CM | POA: Diagnosis not present

## 2018-01-24 DIAGNOSIS — M25751 Osteophyte, right hip: Secondary | ICD-10-CM | POA: Diagnosis not present

## 2018-01-24 DIAGNOSIS — Z981 Arthrodesis status: Secondary | ICD-10-CM | POA: Diagnosis not present

## 2018-01-24 DIAGNOSIS — I959 Hypotension, unspecified: Secondary | ICD-10-CM | POA: Diagnosis present

## 2018-01-24 DIAGNOSIS — I340 Nonrheumatic mitral (valve) insufficiency: Secondary | ICD-10-CM | POA: Diagnosis not present

## 2018-01-24 DIAGNOSIS — Z79899 Other long term (current) drug therapy: Secondary | ICD-10-CM | POA: Diagnosis not present

## 2018-01-24 DIAGNOSIS — Z9049 Acquired absence of other specified parts of digestive tract: Secondary | ICD-10-CM | POA: Diagnosis not present

## 2018-01-24 DIAGNOSIS — I10 Essential (primary) hypertension: Secondary | ICD-10-CM | POA: Diagnosis present

## 2018-01-24 DIAGNOSIS — Z887 Allergy status to serum and vaccine status: Secondary | ICD-10-CM

## 2018-01-24 DIAGNOSIS — E785 Hyperlipidemia, unspecified: Secondary | ICD-10-CM | POA: Diagnosis not present

## 2018-01-24 DIAGNOSIS — M1611 Unilateral primary osteoarthritis, right hip: Principal | ICD-10-CM | POA: Diagnosis present

## 2018-01-24 DIAGNOSIS — Z807 Family history of other malignant neoplasms of lymphoid, hematopoietic and related tissues: Secondary | ICD-10-CM

## 2018-01-24 DIAGNOSIS — Z803 Family history of malignant neoplasm of breast: Secondary | ICD-10-CM

## 2018-01-24 DIAGNOSIS — Z96649 Presence of unspecified artificial hip joint: Secondary | ICD-10-CM

## 2018-01-24 DIAGNOSIS — Z91013 Allergy to seafood: Secondary | ICD-10-CM | POA: Diagnosis not present

## 2018-01-24 DIAGNOSIS — Z96641 Presence of right artificial hip joint: Secondary | ICD-10-CM | POA: Diagnosis not present

## 2018-01-24 DIAGNOSIS — R011 Cardiac murmur, unspecified: Secondary | ICD-10-CM | POA: Diagnosis not present

## 2018-01-24 HISTORY — PX: TOTAL HIP ARTHROPLASTY: SHX124

## 2018-01-24 LAB — TYPE AND SCREEN
ABO/RH(D): O NEG
ANTIBODY SCREEN: NEGATIVE

## 2018-01-24 SURGERY — ARTHROPLASTY, HIP, TOTAL, ANTERIOR APPROACH
Anesthesia: Spinal | Site: Hip | Laterality: Right

## 2018-01-24 MED ORDER — ONDANSETRON HCL 4 MG/2ML IJ SOLN: 4.0000 mg | Freq: Four times a day (QID) | INTRAMUSCULAR | Status: DC | PRN

## 2018-01-24 MED ORDER — GABAPENTIN 300 MG PO CAPS
ORAL_CAPSULE | ORAL | Status: AC
  Filled 2018-01-24: qty 1

## 2018-01-24 MED ORDER — METHOCARBAMOL 500 MG IVPB - SIMPLE MED
INTRAVENOUS | Status: AC
  Administered 2018-01-24: 500 mg via INTRAVENOUS
  Filled 2018-01-24: qty 50

## 2018-01-24 MED ORDER — CEFAZOLIN SODIUM-DEXTROSE 2-4 GM/100ML-% IV SOLN
2.0000 g | INTRAVENOUS | Status: AC
  Administered 2018-01-24: 2 g via INTRAVENOUS
  Filled 2018-01-24: qty 100

## 2018-01-24 MED ORDER — DEXAMETHASONE SODIUM PHOSPHATE 10 MG/ML IJ SOLN
INTRAMUSCULAR | Status: AC
  Filled 2018-01-24: qty 1

## 2018-01-24 MED ORDER — STERILE WATER FOR IRRIGATION IR SOLN
Status: DC | PRN
  Administered 2018-01-24: 2000 mL

## 2018-01-24 MED ORDER — ONDANSETRON HCL 4 MG/2ML IJ SOLN
INTRAMUSCULAR | Status: AC
  Filled 2018-01-24: qty 2

## 2018-01-24 MED ORDER — GABAPENTIN 300 MG PO CAPS
300.0000 mg | ORAL_CAPSULE | Freq: Once | ORAL | Status: AC
  Administered 2018-01-24: 300 mg via ORAL

## 2018-01-24 MED ORDER — KETOROLAC TROMETHAMINE 30 MG/ML IJ SOLN
INTRAMUSCULAR | Status: AC
  Filled 2018-01-24: qty 1

## 2018-01-24 MED ORDER — HYDROMORPHONE HCL 1 MG/ML IJ SOLN: 0.2500 mg | INTRAMUSCULAR | Status: DC | PRN

## 2018-01-24 MED ORDER — IRBESARTAN 150 MG PO TABS: 150.0000 mg | ORAL_TABLET | Freq: Every day | ORAL | Status: DC

## 2018-01-24 MED ORDER — MAGNESIUM CITRATE PO SOLN: 1.0000 | Freq: Once | ORAL | Status: DC | PRN

## 2018-01-24 MED ORDER — PROPOFOL 10 MG/ML IV BOLUS
INTRAVENOUS | Status: DC | PRN
  Administered 2018-01-24: 20 mg via INTRAVENOUS

## 2018-01-24 MED ORDER — GLYCOPYRROLATE PF 0.2 MG/ML IJ SOSY
PREFILLED_SYRINGE | INTRAMUSCULAR | Status: AC
  Filled 2018-01-24: qty 1

## 2018-01-24 MED ORDER — HYDROMORPHONE HCL 1 MG/ML IJ SOLN
INTRAMUSCULAR | Status: AC
  Administered 2018-01-24: 0.25 mg via INTRAVENOUS
  Filled 2018-01-24: qty 1

## 2018-01-24 MED ORDER — SODIUM CHLORIDE 0.9 % IV BOLUS
250.0000 mL | Freq: Once | INTRAVENOUS | Status: AC
  Administered 2018-01-24: 250 mL via INTRAVENOUS

## 2018-01-24 MED ORDER — ONDANSETRON HCL 4 MG/2ML IJ SOLN
INTRAMUSCULAR | Status: DC | PRN
  Administered 2018-01-24: 4 mg via INTRAVENOUS

## 2018-01-24 MED ORDER — DEXAMETHASONE SODIUM PHOSPHATE 10 MG/ML IJ SOLN
10.0000 mg | Freq: Once | INTRAMUSCULAR | Status: AC
  Administered 2018-01-24: 10 mg via INTRAVENOUS

## 2018-01-24 MED ORDER — MEPERIDINE HCL 50 MG/ML IJ SOLN: 6.2500 mg | INTRAMUSCULAR | Status: DC | PRN

## 2018-01-24 MED ORDER — RALOXIFENE HCL 60 MG PO TABS
60.0000 mg | ORAL_TABLET | Freq: Every morning | ORAL | Status: DC
  Filled 2018-01-24: qty 1

## 2018-01-24 MED ORDER — ALUM & MAG HYDROXIDE-SIMETH 200-200-20 MG/5ML PO SUSP: 15.0000 mL | ORAL | Status: DC | PRN

## 2018-01-24 MED ORDER — GLYCOPYRROLATE 0.2 MG/ML IJ SOLN
INTRAMUSCULAR | Status: DC | PRN
  Administered 2018-01-24 (×2): 0.1 mg via INTRAVENOUS

## 2018-01-24 MED ORDER — CELECOXIB 200 MG PO CAPS
200.0000 mg | ORAL_CAPSULE | Freq: Two times a day (BID) | ORAL | Status: DC
  Administered 2018-01-24 – 2018-01-25 (×2): 200 mg via ORAL
  Filled 2018-01-24 (×2): qty 1

## 2018-01-24 MED ORDER — MIDAZOLAM HCL 5 MG/5ML IJ SOLN
INTRAMUSCULAR | Status: DC | PRN
  Administered 2018-01-24 (×2): 1 mg via INTRAVENOUS

## 2018-01-24 MED ORDER — METOCLOPRAMIDE HCL 5 MG PO TABS: 5.0000 mg | ORAL_TABLET | Freq: Three times a day (TID) | ORAL | Status: DC | PRN

## 2018-01-24 MED ORDER — HYDROMORPHONE HCL 1 MG/ML IJ SOLN
0.2500 mg | INTRAMUSCULAR | Status: DC | PRN
  Administered 2018-01-24 (×2): 0.25 mg via INTRAVENOUS
  Administered 2018-01-24: 0.5 mg via INTRAVENOUS
  Administered 2018-01-24: 0.25 mg via INTRAVENOUS
  Administered 2018-01-24: 0.5 mg via INTRAVENOUS
  Administered 2018-01-24: 0.25 mg via INTRAVENOUS

## 2018-01-24 MED ORDER — PROPOFOL 10 MG/ML IV BOLUS
INTRAVENOUS | Status: AC
  Filled 2018-01-24: qty 60

## 2018-01-24 MED ORDER — MIDAZOLAM HCL 2 MG/2ML IJ SOLN
INTRAMUSCULAR | Status: AC
  Filled 2018-01-24: qty 2

## 2018-01-24 MED ORDER — POLYETHYLENE GLYCOL 3350 17 G PO PACK: 17.0000 g | PACK | Freq: Two times a day (BID) | ORAL | 0 refills | Status: DC

## 2018-01-24 MED ORDER — FLUTICASONE PROPIONATE 50 MCG/ACT NA SUSP
2.0000 | Freq: Every day | NASAL | Status: DC
  Filled 2018-01-24: qty 16

## 2018-01-24 MED ORDER — CLONIDINE HCL 0.2 MG PO TABS
0.2000 mg | ORAL_TABLET | Freq: Once | ORAL | Status: AC
  Administered 2018-01-24: 0.2 mg via ORAL
  Filled 2018-01-24: qty 1

## 2018-01-24 MED ORDER — CEFAZOLIN SODIUM-DEXTROSE 2-4 GM/100ML-% IV SOLN
2.0000 g | Freq: Four times a day (QID) | INTRAVENOUS | Status: AC
  Administered 2018-01-24 – 2018-01-25 (×2): 2 g via INTRAVENOUS
  Filled 2018-01-24 (×2): qty 100

## 2018-01-24 MED ORDER — HYDROCODONE-ACETAMINOPHEN 7.5-325 MG PO TABS: 1.0000 | ORAL_TABLET | Freq: Once | ORAL | Status: DC | PRN

## 2018-01-24 MED ORDER — SUGAMMADEX SODIUM 200 MG/2ML IV SOLN
INTRAVENOUS | Status: AC
  Filled 2018-01-24: qty 2

## 2018-01-24 MED ORDER — CHLORHEXIDINE GLUCONATE 4 % EX LIQD: 60.0000 mL | Freq: Once | CUTANEOUS | Status: DC

## 2018-01-24 MED ORDER — ATORVASTATIN CALCIUM 80 MG PO TABS
80.0000 mg | ORAL_TABLET | Freq: Every day | ORAL | Status: DC
  Administered 2018-01-25: 80 mg via ORAL
  Filled 2018-01-24: qty 1
  Filled 2018-01-24: qty 2

## 2018-01-24 MED ORDER — BISACODYL 10 MG RE SUPP: 10.0000 mg | Freq: Every day | RECTAL | Status: DC | PRN

## 2018-01-24 MED ORDER — DOCUSATE SODIUM 100 MG PO CAPS: 100.0000 mg | ORAL_CAPSULE | Freq: Two times a day (BID) | ORAL | 0 refills | Status: DC

## 2018-01-24 MED ORDER — HYDROCODONE-ACETAMINOPHEN 7.5-325 MG PO TABS: 1.0000 | ORAL_TABLET | ORAL | 0 refills | Status: DC | PRN

## 2018-01-24 MED ORDER — DEXAMETHASONE SODIUM PHOSPHATE 10 MG/ML IJ SOLN
10.0000 mg | Freq: Once | INTRAMUSCULAR | Status: AC
  Administered 2018-01-25: 10 mg via INTRAVENOUS
  Filled 2018-01-24: qty 1

## 2018-01-24 MED ORDER — LORATADINE 10 MG PO TABS
10.0000 mg | ORAL_TABLET | Freq: Every day | ORAL | Status: DC
  Administered 2018-01-25: 10 mg via ORAL
  Filled 2018-01-24: qty 1

## 2018-01-24 MED ORDER — ONDANSETRON HCL 4 MG/2ML IJ SOLN: 4.0000 mg | Freq: Once | INTRAMUSCULAR | Status: DC | PRN

## 2018-01-24 MED ORDER — MENTHOL 3 MG MT LOZG: 1.0000 | LOZENGE | OROMUCOSAL | Status: DC | PRN

## 2018-01-24 MED ORDER — LACTATED RINGERS IV SOLN
INTRAVENOUS | Status: DC
  Administered 2018-01-24: 12:00:00 via INTRAVENOUS
  Administered 2018-01-24: 1000 mL via INTRAVENOUS

## 2018-01-24 MED ORDER — SODIUM CHLORIDE 0.9 % IR SOLN
Status: DC | PRN
  Administered 2018-01-24: 1000 mL

## 2018-01-24 MED ORDER — TRANEXAMIC ACID 1000 MG/10ML IV SOLN
1000.0000 mg | INTRAVENOUS | Status: AC
  Administered 2018-01-24: 1000 mg via INTRAVENOUS
  Filled 2018-01-24: qty 10

## 2018-01-24 MED ORDER — MORPHINE SULFATE (PF) 4 MG/ML IV SOLN: 0.5000 mg | INTRAVENOUS | Status: DC | PRN

## 2018-01-24 MED ORDER — CHLORTHALIDONE 25 MG PO TABS: 25.0000 mg | ORAL_TABLET | Freq: Every day | ORAL | Status: DC

## 2018-01-24 MED ORDER — ASPIRIN 81 MG PO CHEW: 81.0000 mg | CHEWABLE_TABLET | Freq: Two times a day (BID) | ORAL | 0 refills | Status: AC

## 2018-01-24 MED ORDER — HYDROMORPHONE HCL 1 MG/ML IJ SOLN
0.2500 mg | INTRAMUSCULAR | Status: DC | PRN
  Administered 2018-01-24 (×2): 0.5 mg via INTRAVENOUS

## 2018-01-24 MED ORDER — ACETAMINOPHEN 10 MG/ML IV SOLN
1000.0000 mg | Freq: Once | INTRAVENOUS | Status: DC | PRN
  Administered 2018-01-24: 1000 mg via INTRAVENOUS

## 2018-01-24 MED ORDER — PHENOL 1.4 % MT LIQD
1.0000 | OROMUCOSAL | Status: DC | PRN
  Filled 2018-01-24: qty 177

## 2018-01-24 MED ORDER — FENTANYL CITRATE (PF) 100 MCG/2ML IJ SOLN
INTRAMUSCULAR | Status: DC | PRN
  Administered 2018-01-24: 100 ug via INTRAVENOUS

## 2018-01-24 MED ORDER — ACETAMINOPHEN 10 MG/ML IV SOLN
INTRAVENOUS | Status: AC
  Filled 2018-01-24: qty 100

## 2018-01-24 MED ORDER — PROPOFOL 500 MG/50ML IV EMUL
INTRAVENOUS | Status: DC | PRN
  Administered 2018-01-24: 125 ug/kg/min via INTRAVENOUS

## 2018-01-24 MED ORDER — BUPIVACAINE IN DEXTROSE 0.75-8.25 % IT SOLN
INTRATHECAL | Status: DC | PRN
  Administered 2018-01-24: 2 mL via INTRATHECAL

## 2018-01-24 MED ORDER — ACETAMINOPHEN 325 MG PO TABS: 325.0000 mg | ORAL_TABLET | Freq: Four times a day (QID) | ORAL | Status: DC | PRN

## 2018-01-24 MED ORDER — PROPOFOL 10 MG/ML IV BOLUS
INTRAVENOUS | Status: AC
  Filled 2018-01-24: qty 40

## 2018-01-24 MED ORDER — SODIUM CHLORIDE 0.9 % IV SOLN
INTRAVENOUS | Status: DC
  Administered 2018-01-24: 17:00:00 via INTRAVENOUS

## 2018-01-24 MED ORDER — FENTANYL CITRATE (PF) 100 MCG/2ML IJ SOLN
INTRAMUSCULAR | Status: AC
  Filled 2018-01-24: qty 2

## 2018-01-24 MED ORDER — PHENYLEPHRINE HCL 10 MG/ML IJ SOLN
INTRAMUSCULAR | Status: AC
  Filled 2018-01-24: qty 1

## 2018-01-24 MED ORDER — FERROUS SULFATE 325 (65 FE) MG PO TABS: 325.0000 mg | ORAL_TABLET | Freq: Three times a day (TID) | ORAL | 3 refills | Status: DC

## 2018-01-24 MED ORDER — ONDANSETRON HCL 4 MG PO TABS: 4.0000 mg | ORAL_TABLET | Freq: Four times a day (QID) | ORAL | Status: DC | PRN

## 2018-01-24 MED ORDER — METHOCARBAMOL 500 MG PO TABS: 500.0000 mg | ORAL_TABLET | Freq: Four times a day (QID) | ORAL | 0 refills | Status: DC | PRN

## 2018-01-24 MED ORDER — METHOCARBAMOL 500 MG PO TABS: 500.0000 mg | ORAL_TABLET | Freq: Four times a day (QID) | ORAL | Status: DC | PRN

## 2018-01-24 MED ORDER — HYDROCODONE-ACETAMINOPHEN 5-325 MG PO TABS
1.0000 | ORAL_TABLET | ORAL | Status: DC | PRN
  Administered 2018-01-24: 2 via ORAL
  Administered 2018-01-25: 1 via ORAL
  Filled 2018-01-24: qty 2
  Filled 2018-01-24: qty 1

## 2018-01-24 MED ORDER — DOCUSATE SODIUM 100 MG PO CAPS
100.0000 mg | ORAL_CAPSULE | Freq: Two times a day (BID) | ORAL | Status: DC
  Administered 2018-01-24 – 2018-01-25 (×2): 100 mg via ORAL
  Filled 2018-01-24 (×2): qty 1

## 2018-01-24 MED ORDER — POLYETHYLENE GLYCOL 3350 17 G PO PACK
17.0000 g | PACK | Freq: Two times a day (BID) | ORAL | Status: DC
  Administered 2018-01-24 – 2018-01-25 (×2): 17 g via ORAL
  Filled 2018-01-24 (×2): qty 1

## 2018-01-24 MED ORDER — DIPHENHYDRAMINE HCL 12.5 MG/5ML PO ELIX: 12.5000 mg | ORAL_SOLUTION | ORAL | Status: DC | PRN

## 2018-01-24 MED ORDER — HYDROMORPHONE HCL 1 MG/ML IJ SOLN
INTRAMUSCULAR | Status: AC
  Filled 2018-01-24: qty 1

## 2018-01-24 MED ORDER — HYDROMORPHONE HCL 1 MG/ML IJ SOLN
INTRAMUSCULAR | Status: AC
  Administered 2018-01-24: 0.5 mg via INTRAVENOUS
  Filled 2018-01-24: qty 1

## 2018-01-24 MED ORDER — ASPIRIN 81 MG PO CHEW
81.0000 mg | CHEWABLE_TABLET | Freq: Two times a day (BID) | ORAL | Status: DC
  Administered 2018-01-24 – 2018-01-25 (×2): 81 mg via ORAL
  Filled 2018-01-24 (×2): qty 1

## 2018-01-24 MED ORDER — TRANEXAMIC ACID 1000 MG/10ML IV SOLN
1000.0000 mg | Freq: Once | INTRAVENOUS | Status: AC
  Administered 2018-01-24: 1000 mg via INTRAVENOUS
  Filled 2018-01-24: qty 1000

## 2018-01-24 MED ORDER — METHOCARBAMOL 500 MG IVPB - SIMPLE MED
500.0000 mg | Freq: Four times a day (QID) | INTRAVENOUS | Status: DC | PRN
  Administered 2018-01-24: 500 mg via INTRAVENOUS
  Filled 2018-01-24: qty 50

## 2018-01-24 MED ORDER — FERROUS SULFATE 325 (65 FE) MG PO TABS
325.0000 mg | ORAL_TABLET | Freq: Three times a day (TID) | ORAL | Status: DC
  Administered 2018-01-25: 325 mg via ORAL
  Filled 2018-01-24 (×2): qty 1

## 2018-01-24 MED ORDER — METOCLOPRAMIDE HCL 5 MG/ML IJ SOLN: 5.0000 mg | Freq: Three times a day (TID) | INTRAMUSCULAR | Status: DC | PRN

## 2018-01-24 MED ORDER — HYDROCODONE-ACETAMINOPHEN 7.5-325 MG PO TABS
1.0000 | ORAL_TABLET | ORAL | Status: DC | PRN
  Administered 2018-01-25: 1 via ORAL
  Filled 2018-01-24: qty 1

## 2018-01-24 SURGICAL SUPPLY — 42 items
ADH SKN CLS APL DERMABOND .7 (GAUZE/BANDAGES/DRESSINGS) ×1
BAG DECANTER FOR FLEXI CONT (MISCELLANEOUS) IMPLANT
BAG SPEC THK2 15X12 ZIP CLS (MISCELLANEOUS)
BAG ZIPLOCK 12X15 (MISCELLANEOUS) IMPLANT
BLADE SAG 18X100X1.27 (BLADE) ×3 IMPLANT
COVER PERINEAL POST (MISCELLANEOUS) ×3 IMPLANT
COVER SURGICAL LIGHT HANDLE (MISCELLANEOUS) ×3 IMPLANT
CUP ACET PINNACLE SECTR 50MM (Hips) IMPLANT
DERMABOND ADVANCED (GAUZE/BANDAGES/DRESSINGS) ×2
DERMABOND ADVANCED .7 DNX12 (GAUZE/BANDAGES/DRESSINGS) ×1 IMPLANT
DRAPE STERI IOBAN 125X83 (DRAPES) ×3 IMPLANT
DRAPE U-SHAPE 47X51 STRL (DRAPES) ×6 IMPLANT
DRESSING AQUACEL AG SP 3.5X10 (GAUZE/BANDAGES/DRESSINGS) ×1 IMPLANT
DRSG AQUACEL AG SP 3.5X10 (GAUZE/BANDAGES/DRESSINGS) ×3
DURAPREP 26ML APPLICATOR (WOUND CARE) ×3 IMPLANT
ELECT REM PT RETURN 15FT ADLT (MISCELLANEOUS) ×3 IMPLANT
ELIMINATOR HOLE APEX DEPUY (Hips) ×2 IMPLANT
GLOVE BIOGEL M STRL SZ7.5 (GLOVE) ×4 IMPLANT
GLOVE BIOGEL PI IND STRL 7.5 (GLOVE) ×1 IMPLANT
GLOVE BIOGEL PI IND STRL 8.5 (GLOVE) ×1 IMPLANT
GLOVE BIOGEL PI INDICATOR 7.5 (GLOVE) ×16
GLOVE BIOGEL PI INDICATOR 8.5 (GLOVE)
GLOVE ECLIPSE 8.0 STRL XLNG CF (GLOVE) ×2 IMPLANT
GLOVE ORTHO TXT STRL SZ7.5 (GLOVE) ×3 IMPLANT
GOWN STRL REUS W/TWL 2XL LVL3 (GOWN DISPOSABLE) ×5 IMPLANT
GOWN STRL REUS W/TWL LRG LVL3 (GOWN DISPOSABLE) ×7 IMPLANT
HEAD FEMORAL 32 CERAMIC (Hips) ×2 IMPLANT
HOLDER FOLEY CATH W/STRAP (MISCELLANEOUS) ×3 IMPLANT
LINER ACET PNNCL PLUS4 NEUTRAL (Hips) IMPLANT
PACK ANTERIOR HIP CUSTOM (KITS) ×3 IMPLANT
PINNACLE PLUS 4 NEUTRAL (Hips) ×3 IMPLANT
PINNACLE SECTOR CUP 50MM (Hips) ×3 IMPLANT
SCREW PINN CAN BONE 6.5MMX15MM (Screw) ×2 IMPLANT
STEM FEM ACTIS HIGH SZ3 (Stem) ×2 IMPLANT
SUT MNCRL AB 4-0 PS2 18 (SUTURE) ×3 IMPLANT
SUT STRATAFIX 0 PDS 27 VIOLET (SUTURE) ×3
SUT VIC AB 1 CT1 36 (SUTURE) ×9 IMPLANT
SUT VIC AB 2-0 CT1 27 (SUTURE) ×6
SUT VIC AB 2-0 CT1 TAPERPNT 27 (SUTURE) ×2 IMPLANT
SUTURE STRATFX 0 PDS 27 VIOLET (SUTURE) ×1 IMPLANT
TRAY FOLEY CATH 14FR (SET/KITS/TRAYS/PACK) ×2 IMPLANT
YANKAUER SUCT BULB TIP 10FT TU (MISCELLANEOUS) IMPLANT

## 2018-01-24 NOTE — Interval H&P Note (Signed)
History and Physical Interval Note:  01/24/2018 10:03 AM  Carolyn Berger  has presented today for surgery, with the diagnosis of Right hip osteoarthritis  The various methods of treatment have been discussed with the patient and family. After consideration of risks, benefits and other options for treatment, the patient has consented to  Procedure(s) with comments: RIGHT TOTAL HIP ARTHROPLASTY ANTERIOR APPROACH (Right) - 70 mins as a surgical intervention .  The patient's history has been reviewed, patient examined, no change in status, stable for surgery.  I have reviewed the patient's chart and labs.  Questions were answered to the patient's satisfaction.     Mauri Pole

## 2018-01-24 NOTE — Transfer of Care (Signed)
Immediate Anesthesia Transfer of Care Note  Patient: Carolyn Berger  Procedure(s) Performed: RIGHT TOTAL HIP ARTHROPLASTY ANTERIOR APPROACH (Right Hip)  Patient Location: PACU  Anesthesia Type:Spinal  Level of Consciousness: awake, alert , oriented and patient cooperative  Airway & Oxygen Therapy: Patient Spontanous Breathing and Patient connected to face mask oxygen  Post-op Assessment: Report given to RN and Post -op Vital signs reviewed and stable  Post vital signs: stable  Last Vitals:  Vitals Value Taken Time  BP 138/73 01/24/2018  2:30 PM  Temp    Pulse 74 01/24/2018  2:40 PM  Resp 7 01/24/2018  2:40 PM  SpO2 98 % 01/24/2018  2:40 PM  Vitals shown include unvalidated device data.  Last Pain:  Vitals:   01/24/18 1400  TempSrc:   PainSc: 9       Patients Stated Pain Goal: 4 (76/14/70 9295)  Complications: No apparent anesthesia complications

## 2018-01-24 NOTE — Anesthesia Postprocedure Evaluation (Signed)
Anesthesia Post Note  Patient: Carolyn Berger  Procedure(s) Performed: RIGHT TOTAL HIP ARTHROPLASTY ANTERIOR APPROACH (Right Hip)     Patient location during evaluation: PACU Anesthesia Type: Spinal Level of consciousness: oriented and awake and alert Pain management: pain level controlled Vital Signs Assessment: post-procedure vital signs reviewed and stable Respiratory status: spontaneous breathing, respiratory function stable and patient connected to nasal cannula oxygen Cardiovascular status: blood pressure returned to baseline and stable Postop Assessment: no headache, no backache and no apparent nausea or vomiting Anesthetic complications: no    Last Vitals:  Vitals:   01/24/18 1600 01/24/18 1732  BP:  (!) 92/47  Pulse:  95  Resp:    Temp: 36.7 C 36.6 C  SpO2:      Last Pain:  Vitals:   01/24/18 1951  TempSrc:   PainSc: [place]

## 2018-01-24 NOTE — Discharge Instructions (Signed)

## 2018-01-24 NOTE — Anesthesia Procedure Notes (Signed)
Spinal  Patient location during procedure: OR Staffing Anesthesiologist: Barnet Glasgow, MD Performed: anesthesiologist  Preanesthetic Checklist Completed: patient identified, surgical consent, pre-op evaluation, timeout performed, IV checked, risks and benefits discussed and monitors and equipment checked Spinal Block Patient position: sitting Prep: site prepped and draped and DuraPrep Patient monitoring: heart rate, cardiac monitor, continuous pulse ox and blood pressure Approach: midline Location: L3-4 Injection technique: single-shot Needle Needle type: Pencan  Needle gauge: 24 G Needle length: 10 cm Needle insertion depth: 6 cm Assessment Sensory level: T4 Additional Notes 1 attempt . Pt tolerated procedure well.

## 2018-01-24 NOTE — Op Note (Signed)
NAME:  Carolyn Berger                ACCOUNT NO.: [id number]      MEDICAL RECORD NO.: [id number]      FACILITY:  [facility]      PHYSICIAN:  Mauri Pole  DATE OF BIRTH:  Oct 19, 1945     DATE OF PROCEDURE:  01/24/2018                                 OPERATIVE REPORT         PREOPERATIVE DIAGNOSIS: Right  hip osteoarthritis.      POSTOPERATIVE DIAGNOSIS:  Right hip osteoarthritis.      PROCEDURE:  Right total hip replacement through an anterior approach   utilizing DePuy THR system, component size 88mm pinnacle cup, a size 32+4 neutral   Altrex liner, a size 3 Hi Actis stem with a 32+1 delta ceramic   ball.      SURGEON:  Pietro Cassis. Alvan Dame, M.D.      ASSISTANT:  Nehemiah Massed, PA-C     ANESTHESIA:  Spinal.      SPECIMENS:  None.      COMPLICATIONS:  None.      BLOOD LOSS:  125 cc     DRAINS:  None.      INDICATION OF THE PROCEDURE:  Carolyn Berger is a 72 y.o. female who had   presented to office for evaluation of right hip pain.  Radiographs revealed   progressive degenerative changes with bone-on-bone   articulation of the  hip joint, including subchondral cystic changes and osteophytes.  The patient had painful limited range of   motion significantly affecting their overall quality of life and function.  The patient was failing to    respond to conservative measures including medications and/or injections and activity modification and at this point was ready   to proceed with more definitive measures.  Consent was obtained for   benefit of pain relief.  Specific risks of infection, DVT, component   failure, dislocation, neurovascular injury, and need for revision surgery were reviewed in the office as well discussion of   the anterior versus posterior approach were reviewed.     PROCEDURE IN DETAIL:  The patient was brought to operative theater.   Once adequate anesthesia, preoperative antibiotics, 2 gm of Ancef, 1 gm of Tranexamic Acid, and  10 mg of Decadron were administered, the patient was positioned supine on the [employer] table.  Once the patient was safely positioned with adequate padding of boney prominences we predraped out the hip, and used fluoroscopy to confirm orientation of the pelvis.      The right hip was then prepped and draped from proximal iliac crest to   mid thigh with a shower curtain technique.      Time-out was performed identifying the patient, planned procedure, and the appropriate extremity.     An incision was then made 2 cm lateral to the   anterior superior iliac spine extending over the orientation of the   tensor fascia lata muscle and sharp dissection was carried down to the   fascia of the muscle.      The fascia was then incised.  The muscle belly was identified and swept   laterally and retractor placed along the superior neck.  Following   cauterization of the circumflex vessels and removing some pericapsular  fat, a second cobra retractor was placed on the inferior neck.  A T-capsulotomy was made along the line of the   superior neck to the trochanteric fossa, then extended proximally and   distally.  Tag sutures were placed and the retractors were then placed   intracapsular.  We then identified the trochanteric fossa and   orientation of my neck cut and then made a neck osteotomy with the femur on traction.  The femoral   head was removed without difficulty or complication.  Traction was let   off and retractors were placed posterior and anterior around the   acetabulum.      The labrum and foveal tissue were debrided.  I began reaming with a 44 mm   reamer and reamed up to 49 mm reamer with good bony bed preparation and a 50 mm  cup was chosen.  The final 50 mm Pinnacle cup was then impacted under fluoroscopy to confirm the depth of penetration and orientation with respect to   Abduction and forward flexion.  A screw was placed into the ilium followed by the hole eliminator.  The  final   32+4 neutral Altrex liner was impacted with good visualized rim fit.  The cup was positioned anatomically within the acetabular portion of the pelvis.      At this point, the femur was rolled to 100 degrees.  Further capsule was   released off the inferior aspect of the femoral neck.  I then   released the superior capsule proximally.  With the leg in a neutral position the hook was placed laterally   along the femur under the vastus lateralis origin and elevated manually and then held in position using the hook attachment on the bed.  The leg was then extended and adducted with the leg rolled to 100   degrees of external rotation.  Retractors were placed along the medial calcar and posteriorly over the greater trochanter.  Once the proximal femur was fully   exposed, I used a box osteotome to set orientation.  I then began   broaching with the starting chili pepper broach and passed this by hand and then broached up to 3.  With the 3 broach in place I chose a high offset neck and did several trial reductions.  The offset was appropriate, leg lengths   appeared to be equal best matched with the 32+1 head ball trial confirmed radiographically.   Given these findings, I went ahead and dislocated the hip, repositioned all   retractors and positioned the right hip in the extended and abducted position.  The final 3 Hi Actis stem was   chosen and it was impacted down to the level of neck cut.  Based on this   and the trial reductions, a final 32+1 delta ceramic ball was chosen and   impacted onto a clean and dry trunnion, and the hip was reduced.  The   hip had been irrigated throughout the case again at this point.  I did   reapproximate the superior capsular leaflet to the anterior leaflet   using #1 Vicryl.  The fascia of the   tensor fascia lata muscle was then reapproximated using #1 Vicryl and #0 Stratafix sutures.  The   remaining wound was closed with 2-0 Vicryl and running 4-0  Monocryl.   The hip was cleaned, dried, and dressed sterilely using Dermabond and   Aquacel dressing.  The patient was then brought   to recovery room in  stable condition tolerating the procedure well.    Nehemiah Massed, PA-C was present for the entirety of the case involved from   preoperative positioning, perioperative retractor management, general   facilitation of the case, as well as primary wound closure as assistant.            Pietro Cassis Alvan Dame, M.D.        01/24/2018 12:53 PM

## 2018-01-24 NOTE — Progress Notes (Signed)
Care Plan Notes 10/26/2017 to 01/24/2018       Care Plan by Leonides Grills A at 01/11/2018 11:32 AM    Date of Service   Author Author Type Status Note Type File Time  01/11/2018  Carolyn Berger  Signed Care Plan 01/11/2018             R THA scheduled on 01-24-18. DCP:  Home with spouse.  1 story home with 2 ste. DME:  No needs.  Borrowing a RW.  Doesn't need a 3-in-1. PT:  HEP

## 2018-01-24 NOTE — Anesthesia Procedure Notes (Signed)
Procedure Name: MAC Date/Time: 01/24/2018 11:05 AM Performed by: Lissa Morales, CRNA Pre-anesthesia Checklist: Patient identified, Emergency Drugs available, Suction available, Patient being monitored and Timeout performed Patient Re-evaluated:Patient Re-evaluated prior to induction Oxygen Delivery Method: Simple face mask Placement Confirmation: positive ETCO2

## 2018-01-25 ENCOUNTER — Encounter (HOSPITAL_COMMUNITY): Payer: Self-pay | Admitting: Orthopedic Surgery

## 2018-01-25 LAB — CBC
HCT: 35.3 % — ABNORMAL LOW (ref 36.0–46.0)
Hemoglobin: 11.5 g/dL — ABNORMAL LOW (ref 12.0–15.0)
MCH: 30 pg (ref 26.0–34.0)
MCHC: 32.6 g/dL (ref 30.0–36.0)
MCV: 92.2 fL (ref 78.0–100.0)
Platelets: 190 10*3/uL (ref 150–400)
RBC: 3.83 MIL/uL — AB (ref 3.87–5.11)
RDW: 13.3 % (ref 11.5–15.5)
WBC: 12.3 10*3/uL — ABNORMAL HIGH (ref 4.0–10.5)

## 2018-01-25 LAB — BASIC METABOLIC PANEL
Anion gap: 8 (ref 5–15)
BUN: 17 mg/dL (ref 8–23)
CHLORIDE: 109 mmol/L (ref 98–111)
CO2: 23 mmol/L (ref 22–32)
Calcium: 8.3 mg/dL — ABNORMAL LOW (ref 8.9–10.3)
Creatinine, Ser: 0.75 mg/dL (ref 0.44–1.00)
GFR calc non Af Amer: >60 mL/min (ref >60)
Glucose, Bld: 133 mg/dL — ABNORMAL HIGH (ref 70–99)
POTASSIUM: 4.2 mmol/L (ref 3.5–5.1)
SODIUM: 140 mmol/L (ref 135–145)

## 2018-01-25 NOTE — Evaluation (Signed)
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 She was independent with adls prior to admission and now needs mostly set up/min guard assist except max A for LB dressing. Husband present and will assist with adls as needed.   All education was completed.    Follow Up Recommendations  Supervision/Assistance - 24 hour    Equipment Recommendations  (may want to consider a shower seat) Has a grab bar in accessible shower   Recommendations for Other Services       Precautions / Restrictions Precautions Precautions: Fall Restrictions Weight Bearing Restrictions: No Other Position/Activity Restrictions: WBAT      Mobility Bed Mobility Overal bed mobility: Needs Assistance Bed Mobility: Supine to Sit;Sit to Supine     Supine to sit: Min guard Sit to supine: Min guard   General bed mobility comments: cues for sequence and use of L LE or strap to self assist  Transfers Overall transfer level: Needs assistance   Transfers: Sit to/from Stand Sit to Stand: Min guard;Supervision         General transfer comment: cues for LE management and use of UEs to self assist    Balance                                           ADL either performed or assessed with clinical judgement   ADL Overall ADL's : Needs assistance/impaired     Grooming: Set up;Sitting   Upper Body Bathing: Set up;Sitting   Lower Body Bathing: Minimal assistance;Sit to/from stand   Upper Body Dressing : Set up;Sitting   Lower Body Dressing: Maximal assistance;Sit to/from stand   Toilet Transfer: Min guard;Stand-pivot;RW(chair)             General ADL Comments: educated on sequence for adls and performed dressing. Pt has an accessible shower  and feels comfortable with toilet transfers.  She has enough reach to shave legs. Husband can assist as needed. She can almost don underwear with set up.   Kitchen tongs would help with reach, or husband can assist.  Educated him on donning ted hose.  Reviewed general safety and working within pain tolerance.  Husband went to get her a long sponge.  If pt does not get a seat for shower, recommended keeping both feet on floor and washing whatever she couldn't reach when sitting on commode     Vision         Perception     Praxis      Pertinent Vitals/Pain Pain Assessment: 0-10 Pain Score: 3  Pain Location: R hip Pain Descriptors / Indicators: Aching;Sore Pain Intervention(s): Limited activity within patient's tolerance;Monitored during session;Premedicated before session;Repositioned     Hand Dominance     Extremity/Trunk Assessment Upper Extremity Assessment Upper Extremity Assessment: Overall WFL for tasks assessed           Communication Communication Communication: No difficulties   Cognition Arousal/Alertness: Awake/alert Behavior During Therapy: WFL for tasks assessed/performed Overall Cognitive Status: Within Functional Limits for tasks assessed  General Comments       Exercises    Shoulder Instructions      Home Living Family/patient expects to be discharged to:: Private residence Living Arrangements: Spouse/significant other Available Help at Discharge: Family               Bathroom Shower/Tub: Walk-in Corporate treasurer Toilet: Handicapped height     Home Equipment: Grab bars - tub/shower;Grab bars - toilet   Additional Comments: shower is w/c accessible      Prior Functioning/Environment Level of Independence: Independent                 OT Problem List:        OT Treatment/Interventions:      OT Goals(Current goals can be found in the care plan section) Acute Rehab OT  Goals 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 OT Goal Formulation: All assessment and education complete, DC therapy  OT Frequency:     Barriers to D/C:            Co-evaluation              AM-PAC PT "6 Clicks" Daily Activity     Outcome Measure Help from another person eating meals?: None Help from another person taking care of personal grooming?: A Little Help from another person toileting, which includes using toliet, bedpan, or urinal?: A Little Help from another person bathing (including washing, rinsing, drying)?: A Little Help from another person to put on and taking off regular upper body clothing?: A Little Help from another person to put on and taking off regular lower body clothing?: A Lot 6 Click Score: 18   End of Session Nurse Communication: (finished with OT;ready for d/c)  Activity Tolerance: Patient tolerated treatment well Patient left: in chair;with call bell/phone within reach;with family/visitor present  OT Visit Diagnosis: Pain Pain - Right/Left: Right Pain - part of body: Hip                Time: 1338-1400 OT Time Calculation (min): 22 min Charges:  OT General Charges $OT Visit: 1 Visit OT Evaluation $OT Eval Low Complexity: 1 Low  Lesle Chris, OTR/L [phone] 01/25/2018  [place] 01/25/2018, 3:17 PM

## 2018-01-25 NOTE — Progress Notes (Signed)
Spoke with T. Shuford, PA with concerns of patient's blood pressure. 92/47, 87/49 and c/o of feeling lightheaded. Given orders for 266mL NS bolus over 1 hour and recheck BP/monitor closely. Pt's dressing to Rhip is CDI, foley draining clear yellow urine. Bed alarm in place, patient has no c/o pain currently and plan of care discussed with patient and husband at bedside. We will continue to closely monitor patient.

## 2018-01-25 NOTE — Evaluation (Signed)
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  Pt should progress to dc home with family assist.  OOB deferred this session - pt receiving bolus for low BP.    Follow Up Recommendations Follow surgeon's recommendation for DC plan and follow-up therapies    Equipment Recommendations  None recommended by PT    Recommendations for Other Services       Precautions / Restrictions Precautions Precautions: Fall Restrictions Weight Bearing Restrictions: No Other Position/Activity Restrictions: WBAT      Mobility  Bed Mobility               General bed mobility comments: OOB deferred to after bolus for low BP  Transfers                    Ambulation/Gait                Stairs            Wheelchair Mobility    Modified Rankin (Stroke Patients Only)       Balance                                             Pertinent Vitals/Pain Pain Assessment: 0-10 Pain Score: 3  Pain Location: R hip Pain Descriptors / Indicators: Aching;Sore Pain Intervention(s): Limited activity within patient's tolerance;Monitored during session;Premedicated before session;Ice applied    Home Living Family/patient expects to be discharged to:: Private residence Living Arrangements: Spouse/significant other Available Help at Discharge: Family Type of Home: House Home Access: Stairs to enter Entrance Stairs-Rails: None Technical brewer of Steps: 2 Home Layout: One level Home Equipment: Environmental consultant - standard      Prior Function Level of Independence: Independent               Hand Dominance        Extremity/Trunk Assessment   Upper Extremity  Assessment Upper Extremity Assessment: Overall WFL for tasks assessed    Lower Extremity Assessment Lower Extremity Assessment: RLE deficits/detail RLE Deficits / Details: 2+/5 strength at hip with AAROM at hip to 90 flex and 20 abd       Communication   Communication: No difficulties  Cognition Arousal/Alertness: Awake/alert Behavior During Therapy: WFL for tasks assessed/performed Overall Cognitive Status: Within Functional Limits for tasks assessed                                        General Comments      Exercises Total Joint Exercises Ankle Circles/Pumps: AROM;Both;20 reps;Supine Quad Sets: AROM;Both;15 reps;Supine Heel Slides: AAROM;Right;20 reps;Supine Hip ABduction/ADduction: AAROM;Right;15 reps;Supine   Assessment/Plan    PT Assessment Patient needs continued PT services  PT Problem List Decreased strength;Decreased range of motion;Decreased activity tolerance;Decreased mobility;Decreased knowledge of use of DME;Pain       PT Treatment Interventions DME instruction;Gait training;Stair training;Functional mobility training;Therapeutic activities;Therapeutic exercise;Patient/family education    PT Goals (Current goals can be found in the Care Plan section)  Acute Rehab PT Goals  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 : A Lot Difficulty sitting down on and standing up from a chair with arms (e.g., wheelchair, bedside commode, etc,.)?: A Lot Help needed moving to and from a bed to chair (including a wheelchair)?: A Little Help needed  walking in hospital room?: A Little Help needed climbing 3-5 steps with a railing? : A Little 6 Click Score: 15    End of Session   Activity Tolerance: Patient tolerated treatment well Patient left: in bed;with call bell/phone within reach;with family/visitor present Nurse Communication: Mobility status PT Visit Diagnosis: Difficulty in walking, not elsewhere classified (R26.2)    Time: 5102-5852 PT Time Calculation (min) (ACUTE ONLY): 20 min   Charges:   PT Evaluation $PT Eval Low Complexity: 1 Low          Pg [phone]   Koreen Lizaola 01/25/2018, 8:51 AM

## 2018-01-25 NOTE — Progress Notes (Signed)
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  Spouse present and reviewed car transfers, stairs and home therex with written instruction and progression provided.   Follow Up Recommendations  Follow surgeon's recommendation for DC plan and follow-up therapies     Equipment Recommendations  None recommended by PT    Recommendations for Other Services       Precautions / Restrictions Precautions Precautions: Fall Restrictions Weight Bearing Restrictions: No Other Position/Activity Restrictions: WBAT    Mobility  Bed Mobility Overal bed mobility: Needs Assistance Bed Mobility: Supine to Sit;Sit to Supine     Supine to sit: Min guard Sit to supine: Min guard   General bed mobility comments: cues for sequence and use of L LE or strap to self assist  Transfers Overall transfer level: Needs assistance   Transfers: Sit to/from Stand Sit to Stand: Min guard;Supervision         General transfer comment: cues for LE management and use of UEs to self assist  Ambulation/Gait Ambulation/Gait assistance: Min guard;Supervision Gait Distance (Feet): 150 Feet Assistive device: Standard walker Gait Pattern/deviations: Step-to pattern;Shuffle;Trunk flexed Gait velocity: decr   General Gait Details: cues for sequence, posture, position from RW and ER on R   Stairs Stairs: Yes Stairs assistance: Min assist Stair Management: No rails;Step to pattern;Forwards Number of Stairs: 2 General stair comments: RW at top of stairs; cues for sequence and foot placement   Wheelchair Mobility    Modified Rankin (Stroke Patients Only)       Balance                                            Cognition Arousal/Alertness:  Awake/alert Behavior During Therapy: WFL for tasks assessed/performed Overall Cognitive Status: Within Functional Limits for tasks assessed                                        Exercises Total Joint Exercises Ankle Circles/Pumps: AROM;Both;20 reps;Supine Quad Sets: AROM;Both;15 reps;Supine Heel Slides: AAROM;Right;20 reps;Supine Hip ABduction/ADduction: AAROM;Right;15 reps;Supine Long Arc Quad: AROM;Right;15 reps;Seated    General Comments        Pertinent Vitals/Pain Pain Assessment: 0-10 Pain Score: 3  Pain Location: R hip Pain Descriptors / Indicators: Aching;Sore Pain Intervention(s): Limited activity within patient's tolerance;Monitored during session;Premedicated before session;Ice applied    Home Living                      Prior Function            PT Goals (current goals can now be found in the care plan section) Acute Rehab PT Goals 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 PT Goal Formulation: With patient Time For Goal Achievement: 02/01/18 Potential to Achieve Goals: Good Progress towards PT goals: Progressing toward goals    Frequency    7X/week      PT Plan Current plan remains appropriate    Co-evaluation              AM-PAC PT "6  Clicks" Daily Activity  Outcome Measure  Difficulty turning over in bed (including adjusting bedclothes, sheets and blankets)?: A Lot Difficulty moving from lying on back to sitting on the side of the bed? : A Lot Difficulty sitting down on and standing up from a chair with arms (e.g., wheelchair, bedside commode, etc,.)?: A Lot Help needed moving to and from a bed to chair (including a wheelchair)?: A Little Help needed walking in hospital room?: A Little Help needed climbing 3-5 steps with a railing? : A Little 6 Click Score: 15    End of Session Equipment Utilized During Treatment: Gait belt Activity Tolerance: Patient tolerated treatment well Patient left: in bed;with call  bell/phone within reach;with family/visitor present Nurse Communication: Mobility status PT Visit Diagnosis: Difficulty in walking, not elsewhere classified (R26.2)     Time: 2707-8675 PT Time Calculation (min) (ACUTE ONLY): 50 min  Charges:  $Gait Training: 8-22 mins $Therapeutic Exercise: 8-22 mins $Therapeutic Activity: 8-22 mins                     Pg [phone]    Priscilla Finklea 01/25/2018, 12:44 PM

## 2018-01-25 NOTE — Discharge Summary (Signed)
Physician Discharge Summary  Patient ID: SHAVONN CONVEY MRN: [id number] DOB/AGE: December 25, 1945 72 y.o.  Admit date: 01/24/2018 Discharge date:  01/25/2018  Procedures:  Procedure(s) (LRB): RIGHT TOTAL HIP ARTHROPLASTY ANTERIOR APPROACH (Right)  Attending Physician:  Dr. Paralee Cancel   Admission Diagnoses:   Right hip primary OA /pain  Discharge Diagnoses:  Principal Problem:   S/P right THA, AA Active Problems:   S/P hip replacement  Past Medical History:  Diagnosis Date  . Allergy    seasonal allergies, presently upper respiratory symptoms"runny nose, post nasal drip, rare cough_tx. Zpack  . Anemia    hx of blood transfusion 1972 after childbirth hemarrahe no reaction   . Arthritis    oa  . Cataract   . GERD (gastroesophageal reflux disease)    controls with diet and rare OTC meds  . Heart murmur    mvp insufficiency  . Hematoma 20 yrs ago   after breast biopsy  . Hypertension   . Mitral valve prolapse    with moderate mitral regurgitation-LOV Dr. Percival Spanish 10-22-13    HPI:    Carolyn Berger, 72 y.o. female, has a history of pain and functional disability in the right hip(s) due to arthritis and patient has failed non-surgical conservative treatments for greater than 12 weeks to include NSAID's and/or analgesics and activity modification.  Onset of symptoms was gradual starting ~1 year ago with gradually worsening course since that time.The patient noted no past surgery on the right hip(s).  Patient currently rates pain in the right hip at 7 out of 10 with activity. Patient has night pain, worsening of pain with activity and weight bearing, trendelenberg gait, pain that interfers with activities of daily living and pain with passive range of motion. Patient has evidence of periarticular osteophytes and joint space narrowing by imaging studies. This condition presents safety issues increasing the risk of falls. There is no current active infection.  Risks, benefits  and expectations were discussed with the patient.  Risks including but not limited to the risk of anesthesia, blood clots, nerve damage, blood vessel damage, failure of the prosthesis, infection and up to and including death.  Patient understand the risks, benefits and expectations and wishes to proceed with surgery.   PCP: Crist Infante, MD   Discharged Condition: good  Hospital Course:  Patient underwent the above stated procedure on 01/24/2018. Patient tolerated the procedure well and brought to the recovery room in good condition and subsequently to the floor.  POD #1 BP: 98/48 ; Pulse: 62 ; Temp: 98 F (36.7 C) ; Resp: 20 Patient reports pain as mild, pain controlled. States that she has had some issues with hypotension.  Otherwise no events throughout the night.  Discussed staying well hydrated and holding HTN meds at home until BP elevates.  Pt's husband is a doctor and states that he will monitor at home.  Patient is ready to be discharged home. Dorsiflexion/plantar flexion intact, incision: dressing C/D/I, no cellulitis present and compartment soft.   LABS  Basename    HGB     11.5  HCT     35.3    Discharge Exam: General appearance: alert, cooperative and no distress Extremities: Homans sign is negative, no sign of DVT, no edema, redness or tenderness in the calves or thighs and no ulcers, gangrene or trophic changes  Disposition: Home with follow up in 2 weeks   Follow-up Information    Paralee Cancel, MD. Schedule an appointment as soon as possible for  a visit in 2 weeks.   Specialty:  Orthopedic Surgery Contact information: [address] [place] [address] [phone]           Discharge Instructions    Call MD / Call 911   Complete by:  As directed    If you experience chest pain or shortness of breath, CALL 911 and be transported to the hospital emergency room.  If you develope a fever above 101 F, pus (white drainage) or increased drainage or  redness at the wound, or calf pain, call your surgeon's office.   Change dressing   Complete by:  As directed    Maintain surgical dressing until follow up in the clinic. If the edges start to pull up, may reinforce with tape. If the dressing is no longer working, may remove and cover with gauze and tape, but must keep the area dry and clean.  Call with any questions or concerns.   Constipation Prevention   Complete by:  As directed    Drink plenty of fluids.  Prune juice may be helpful.  You may use a stool softener, such as Colace (over the counter) 100 mg twice a day.  Use MiraLax (over the counter) for constipation as needed.   Diet - low sodium heart healthy   Complete by:  As directed    Discharge instructions   Complete by:  As directed    Maintain surgical dressing until follow up in the clinic. If the edges start to pull up, may reinforce with tape. If the dressing is no longer working, may remove and cover with gauze and tape, but must keep the area dry and clean.  Follow up in 2 weeks at [facility]. Call with any questions or concerns.   Increase activity slowly as tolerated   Complete by:  As directed    Weight bearing as tolerated with assist device (walker, cane, etc) as directed, use it as long as suggested by your surgeon or therapist, typically at least 4-6 weeks.   TED hose   Complete by:  As directed    Use stockings (TED hose) for 2 weeks on both leg(s).  You may remove them at night for sleeping.      Allergies as of 01/25/2018      Reactions   Typhoid Vaccines Rash   Lobster [shellfish Allergy] Nausea And Vomiting   Lobster only      Medication List    STOP taking these medications   ibuprofen 200 MG tablet Commonly known as:  ADVIL,MOTRIN     TAKE these medications   aspirin 81 MG chewable tablet Chew 1 tablet (81 mg total) by mouth 2 (two) times daily. Take for 4 weeks, then resume regular dose.   atorvastatin 80 MG tablet Commonly known as:   LIPITOR Take 80 mg by mouth daily.   cetirizine 10 MG tablet Commonly known as:  ZYRTEC Take 10 mg by mouth daily as needed for allergies.   chlorthalidone 25 MG tablet Commonly known as:  HYGROTON TAKE 1 TABLET(25 MG) BY MOUTH DAILY What changed:  See the new instructions.   docusate sodium 100 MG capsule Commonly known as:  COLACE Take 1 capsule (100 mg total) by mouth 2 (two) times daily.   ferrous sulfate 325 (65 FE) MG tablet Take 1 tablet (325 mg total) by mouth 3 (three) times daily with meals.   fluticasone 50 MCG/ACT nasal spray Commonly known as:  FLONASE Place 2 sprays into both  nostrils daily.   HYDROcodone-acetaminophen 7.5-325 MG tablet Commonly known as:  NORCO Take 1-2 tablets by mouth every 4 (four) hours as needed for moderate pain.   irbesartan 150 MG tablet Commonly known as:  AVAPRO Take 150 mg by mouth daily.   methocarbamol 500 MG tablet Commonly known as:  ROBAXIN Take 1 tablet (500 mg total) by mouth every 6 (six) hours as needed for muscle spasms.   polyethylene glycol packet Commonly known as:  MIRALAX / GLYCOLAX Take 17 g by mouth 2 (two) times daily.   raloxifene 60 MG tablet Commonly known as:  EVISTA Take 60 mg by mouth every morning.   VAGIFEM 10 MCG Tabs vaginal tablet Generic drug:  Estradiol Place 10 mcg vaginally once a week.            Discharge Care Instructions  (From admission, onward)         Start     Ordered   01/25/18 0000  Change dressing    Comments:  Maintain surgical dressing until follow up in the clinic. If the edges start to pull up, may reinforce with tape. If the dressing is no longer working, may remove and cover with gauze and tape, but must keep the area dry and clean.  Call with any questions or concerns.   01/25/18 4373           Signed: [place] Pugh. Jamario Colina   PA-C  01/25/2018, 9:05 AM

## 2018-01-25 NOTE — Progress Notes (Signed)
     Subjective: 1 Day Post-Op Procedure(s) (LRB): RIGHT TOTAL HIP ARTHROPLASTY ANTERIOR APPROACH (Right)   Patient reports pain as mild, pain controlled. States that she has had some issues with hypotension.  Otherwise no events throughout the night.  Discussed staying well hydrated and holding HTN meds at home until BP elevates.  Pt's husband is a doctor and states that he will monitor at home.  Patient is ready to be discharged home, if she does well with PT.    Objective:   VITALS:   Vitals:   01/25/18 0215 01/25/18 0557  BP: 96/60 (!) 98/48  Pulse: (!) 58 62  Resp:  20  Temp:  98 F (36.7 C)  SpO2:  100%    Dorsiflexion/Plantar flexion intact Incision: dressing C/D/I No cellulitis present Compartment soft  LABS Recent Labs    01/23/18 1222 01/25/18 0527  HGB 13.8 11.5*  HCT 41.5 35.3*  WBC 9.4 12.3*  PLT 248 190    Recent Labs    01/23/18 1222 01/25/18 0527  NA 143 140  K 5.8* 4.2  BUN 24* 17  CREATININE 0.84 0.75  GLUCOSE 108* 133*     Assessment/Plan: 1 Day Post-Op Procedure(s) (LRB): RIGHT TOTAL HIP ARTHROPLASTY ANTERIOR APPROACH (Right) Foley cath d/c'ed 250cc NS bolus Advance diet Up with therapy D/C IV fluids Discharge home Follow up in 2 weeks at [facility] Follow up with OLIN,Christyn Gutkowski D in 2 weeks.  Contact information:  [facility]) [address], [address] [place]. [place]   PAC  01/25/2018, 8:58 AM

## 2018-02-08 DIAGNOSIS — Z96641 Presence of right artificial hip joint: Secondary | ICD-10-CM | POA: Diagnosis not present

## 2018-02-08 DIAGNOSIS — Z471 Aftercare following joint replacement surgery: Secondary | ICD-10-CM | POA: Diagnosis not present

## 2018-02-08 DIAGNOSIS — M545 Low back pain: Secondary | ICD-10-CM | POA: Diagnosis not present

## 2018-02-08 DIAGNOSIS — M5416 Radiculopathy, lumbar region: Secondary | ICD-10-CM | POA: Diagnosis not present

## 2018-02-21 DIAGNOSIS — Z6821 Body mass index (BMI) 21.0-21.9, adult: Secondary | ICD-10-CM | POA: Diagnosis not present

## 2018-02-21 DIAGNOSIS — B353 Tinea pedis: Secondary | ICD-10-CM | POA: Diagnosis not present

## 2018-02-22 ENCOUNTER — Other Ambulatory Visit: Payer: Self-pay

## 2018-02-22 ENCOUNTER — Other Ambulatory Visit: Payer: Self-pay | Admitting: Cardiology

## 2018-02-22 ENCOUNTER — Ambulatory Visit (HOSPITAL_COMMUNITY): Payer: Medicare Other | Attending: Cardiology

## 2018-02-22 DIAGNOSIS — I080 Rheumatic disorders of both mitral and aortic valves: Secondary | ICD-10-CM | POA: Insufficient documentation

## 2018-02-22 DIAGNOSIS — I059 Rheumatic mitral valve disease, unspecified: Secondary | ICD-10-CM

## 2018-02-22 DIAGNOSIS — I119 Hypertensive heart disease without heart failure: Secondary | ICD-10-CM | POA: Insufficient documentation

## 2018-02-26 DIAGNOSIS — L509 Urticaria, unspecified: Secondary | ICD-10-CM | POA: Diagnosis not present

## 2018-02-26 DIAGNOSIS — L282 Other prurigo: Secondary | ICD-10-CM | POA: Diagnosis not present

## 2018-02-26 DIAGNOSIS — Z6821 Body mass index (BMI) 21.0-21.9, adult: Secondary | ICD-10-CM | POA: Diagnosis not present

## 2018-03-08 DIAGNOSIS — Z96641 Presence of right artificial hip joint: Secondary | ICD-10-CM | POA: Diagnosis not present

## 2018-03-08 DIAGNOSIS — M1611 Unilateral primary osteoarthritis, right hip: Secondary | ICD-10-CM | POA: Diagnosis not present

## 2018-03-08 DIAGNOSIS — Z471 Aftercare following joint replacement surgery: Secondary | ICD-10-CM | POA: Diagnosis not present

## 2018-03-11 DIAGNOSIS — Z23 Encounter for immunization: Secondary | ICD-10-CM | POA: Diagnosis not present

## 2018-03-28 DIAGNOSIS — L728 Other follicular cysts of the skin and subcutaneous tissue: Secondary | ICD-10-CM | POA: Diagnosis not present

## 2018-03-28 DIAGNOSIS — L298 Other pruritus: Secondary | ICD-10-CM | POA: Diagnosis not present

## 2018-04-19 DIAGNOSIS — M1611 Unilateral primary osteoarthritis, right hip: Secondary | ICD-10-CM | POA: Diagnosis not present

## 2018-04-19 DIAGNOSIS — M25551 Pain in right hip: Secondary | ICD-10-CM | POA: Diagnosis not present

## 2018-04-26 DIAGNOSIS — Z1231 Encounter for screening mammogram for malignant neoplasm of breast: Secondary | ICD-10-CM | POA: Diagnosis not present

## 2018-04-26 DIAGNOSIS — Z803 Family history of malignant neoplasm of breast: Secondary | ICD-10-CM | POA: Diagnosis not present

## 2018-05-09 DIAGNOSIS — M25551 Pain in right hip: Secondary | ICD-10-CM | POA: Diagnosis not present

## 2018-05-18 DIAGNOSIS — M25551 Pain in right hip: Secondary | ICD-10-CM | POA: Diagnosis not present

## 2018-05-24 DIAGNOSIS — M25551 Pain in right hip: Secondary | ICD-10-CM | POA: Diagnosis not present

## 2018-05-26 DIAGNOSIS — M25551 Pain in right hip: Secondary | ICD-10-CM | POA: Diagnosis not present

## 2018-05-29 DIAGNOSIS — M25551 Pain in right hip: Secondary | ICD-10-CM | POA: Diagnosis not present

## 2018-06-01 DIAGNOSIS — M25551 Pain in right hip: Secondary | ICD-10-CM | POA: Diagnosis not present

## 2018-06-06 DIAGNOSIS — M25551 Pain in right hip: Secondary | ICD-10-CM | POA: Diagnosis not present

## 2018-06-09 DIAGNOSIS — Z1211 Encounter for screening for malignant neoplasm of colon: Secondary | ICD-10-CM | POA: Diagnosis not present

## 2018-06-13 DIAGNOSIS — Z961 Presence of intraocular lens: Secondary | ICD-10-CM | POA: Diagnosis not present

## 2018-06-13 DIAGNOSIS — M25551 Pain in right hip: Secondary | ICD-10-CM | POA: Diagnosis not present

## 2018-06-13 DIAGNOSIS — H43813 Vitreous degeneration, bilateral: Secondary | ICD-10-CM | POA: Diagnosis not present

## 2018-06-16 DIAGNOSIS — M25551 Pain in right hip: Secondary | ICD-10-CM | POA: Diagnosis not present

## 2018-07-21 DIAGNOSIS — Z124 Encounter for screening for malignant neoplasm of cervix: Secondary | ICD-10-CM | POA: Diagnosis not present

## 2018-07-21 DIAGNOSIS — R7301 Impaired fasting glucose: Secondary | ICD-10-CM | POA: Diagnosis not present

## 2018-07-21 DIAGNOSIS — I10 Essential (primary) hypertension: Secondary | ICD-10-CM | POA: Diagnosis not present

## 2018-07-21 DIAGNOSIS — M859 Disorder of bone density and structure, unspecified: Secondary | ICD-10-CM | POA: Diagnosis not present

## 2018-07-21 DIAGNOSIS — R82998 Other abnormal findings in urine: Secondary | ICD-10-CM | POA: Diagnosis not present

## 2018-07-21 DIAGNOSIS — E7849 Other hyperlipidemia: Secondary | ICD-10-CM | POA: Diagnosis not present

## 2018-07-25 ENCOUNTER — Telehealth: Payer: Self-pay | Admitting: Hematology and Oncology

## 2018-07-25 NOTE — Telephone Encounter (Signed)
A new patient appt has been scheduled for the pt to see Dr. Alvy Bimler on 2/19 at 930am. I cld and spoke to Carolyn Berger's husband who's been made of the appt date and time. Aware to arrive 30 minutes early to be checked in on time.

## 2018-07-26 ENCOUNTER — Inpatient Hospital Stay: Payer: Medicare Other | Attending: Hematology and Oncology | Admitting: Hematology and Oncology

## 2018-07-26 ENCOUNTER — Telehealth: Payer: Self-pay | Admitting: Hematology and Oncology

## 2018-07-26 DIAGNOSIS — Z803 Family history of malignant neoplasm of breast: Secondary | ICD-10-CM | POA: Insufficient documentation

## 2018-07-26 DIAGNOSIS — Z96641 Presence of right artificial hip joint: Secondary | ICD-10-CM | POA: Insufficient documentation

## 2018-07-26 DIAGNOSIS — Z808 Family history of malignant neoplasm of other organs or systems: Secondary | ICD-10-CM | POA: Diagnosis not present

## 2018-07-26 DIAGNOSIS — Z807 Family history of other malignant neoplasms of lymphoid, hematopoietic and related tissues: Secondary | ICD-10-CM | POA: Diagnosis not present

## 2018-07-26 DIAGNOSIS — D699 Hemorrhagic condition, unspecified: Secondary | ICD-10-CM | POA: Diagnosis not present

## 2018-07-26 NOTE — Telephone Encounter (Signed)
Gave avs and calendar ° °

## 2018-07-27 ENCOUNTER — Encounter: Payer: Self-pay | Admitting: Hematology and Oncology

## 2018-07-27 DIAGNOSIS — D699 Hemorrhagic condition, unspecified: Secondary | ICD-10-CM | POA: Insufficient documentation

## 2018-07-27 NOTE — Progress Notes (Signed)
[facility] CONSULT NOTE  Patient Care Team: Crist Infante, MD as PCP - General (Internal Medicine) Minus Breeding, MD as PCP - Cardiology (Cardiology) Guest, Benn Moulder, MD (Internal Medicine)  ASSESSMENT & PLAN:  Bleeding disorder [facility]) The patient has recurrent bleeding history in the past, suspicious for possible bleeding disorder In 2014, one of her physicians have ordered screening test for von Willebrand disease Her von Willebrand factor level, ristocetin cofactor activity and factor VIII level were within normal limits It is possible that she might have platelet disorders that could be contributing to her excessive bleeding risk I recommend repeat baseline coagulation study and platelet aggregation study for further evaluation I will call her with test results   Orders Placed This Encounter  Procedures  . CBC with Differential/Platelet    Standing Status:   Future    Standing Expiration Date:   08/31/2019  . APTT    Standing Status:   Future    Standing Expiration Date:   08/31/2019  . Protime-INR    Standing Status:   Future    Standing Expiration Date:   08/31/2019  . Comprehensive metabolic panel    Standing Status:   Future    Standing Expiration Date:   08/31/2019  . Fibrinogen    Standing Status:   Future    Standing Expiration Date:   08/31/2019  . Platelet aggregation study, blood    Standing Status:   Future    Standing Expiration Date:   08/31/2019    CHIEF COMPLAINTS/PURPOSE OF CONSULTATION:  Significant bleeding history, worrisome for possible bleeding disorder  HISTORY OF PRESENTING ILLNESS:  Carolyn Berger 73 y.o. female is here because of history of recurrent bleeding.  She is here accompanied by her husband The patient had history of heavy menstruation.  She was placed on oral contraceptives for several years in the past for heavy menstruation. In 1972, after the first child was born, she had postpartum hemorrhage requiring 2 units of  blood Her second son was born in 67 without postpartum complication Approximately 25 years ago, she underwent breast biopsy, complicated by hematoma She underwent right hip replacement surgery in 2353, complicated by severe postoperative hematoma.   Thankfully, she did not need blood transfusion after her hip replacement surgery but she did develop significant postoperative anemia requiring oral iron supplement She denies family history of bleeding disorder She had wisdom teeth extraction without excessive bleeding.   She denies spontaneous hematuria, nosebleed or hematochezia. She denies over-the-counter supplements such as garlic supplement, NSAID or aspirin-like therapy. She has history of psoriasis and has been placed on multiple different agents but has not been on any treatment for the last year and a half.  She denies recent skin rashes or joint pain.  MEDICAL HISTORY:  Past Medical History:  Diagnosis Date  . Allergy    seasonal allergies, presently upper respiratory symptoms"runny nose, post nasal drip, rare cough_tx. Zpack  . Anemia    hx of blood transfusion 1972 after childbirth hemarrahe no reaction   . Arthritis    oa  . Cataract   . GERD (gastroesophageal reflux disease)    controls with diet and rare OTC meds  . Heart murmur    mvp insufficiency  . Hematoma 20 yrs ago   after breast biopsy  . Hypertension   . Mitral valve prolapse    with moderate mitral regurgitation-LOV Dr. Percival Spanish 10-22-13    SURGICAL HISTORY: Past Surgical History:  Procedure Laterality Date  .  ANTERIOR CRUCIATE LIGAMENT REPAIR Left 20 yrs ago  . BREAST BIOPSY Bilateral last 20 yrs ago   of benign lesions right x 2 left x 1  . CHOLECYSTECTOMY N/A 08/02/2014   Procedure: LAPAROSCOPIC CHOLECYSTECTOMY WITH INTRAOPERATIVE CHOLANGIOGRAM;  Surgeon: Pedro Earls, MD;  Location: [facility];  Service: General;  Laterality: N/A;  . EYE SURGERY Bilateral    2016 ioc with lens implants  . left wrist  contracture surgery  2018   done under local in office  . minicus Right 20 yrs ago   meniscus repair right knee  . 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 TEE WITHOUT CARDIOVERSION  07/23/2011   Procedure: TRANSESOPHAGEAL ECHOCARDIOGRAM (TEE);  Surgeon: Loralie Champagne, MD;  Location: [place];  Service: Cardiovascular;  Laterality: N/A;  . TOTAL HIP ARTHROPLASTY Right 01/24/2018   Procedure: RIGHT TOTAL HIP ARTHROPLASTY ANTERIOR APPROACH;  Surgeon: Paralee Cancel, MD;  Location: [facility];  Service: Orthopedics;  Laterality: Right;  70 mins  . TUBAL LIGATION  1980 or 1981    SOCIAL HISTORY: Social History   Socioeconomic History  . Marital status: Married    Spouse name: Not on file  . Number of children: Not on file  . Years of education: Not on file  . Highest education level: Not on file  Occupational History  . Not on file  Social Needs  . Financial resource strain: Not on file  . Food insecurity:    Worry: Not on file    Inability: Not on file  . Transportation needs:    Medical: Not on file    Non-medical: Not on file  Tobacco Use  . Smoking status: Never Smoker  . Smokeless tobacco: Never Used  Substance and Sexual Activity  . Alcohol use: Yes    Comment: 1-2 glasses of wine per day  . Drug use: Never  . Sexual activity: Yes  Lifestyle  . Physical activity:    Days per week: Not on file    Minutes per session: Not on file  . Stress: Not on file  Relationships  . Social connections:    Talks on phone: Not on file    Gets together: Not on file    Attends religious service: Not on file    Active member of club or organization: Not on file    Attends meetings of clubs or organizations: Not on file    Relationship status: Not on file  . Intimate partner violence:    Fear of current or ex partner: Not on file    Emotionally abused: Not on file    Physically abused: Not on file    Forced sexual activity: Not on  file  Other Topics Concern  . Not on file  Social History Narrative   Exercise karate daily for > 1 hour    FAMILY HISTORY: Family History  Problem Relation Age of Onset  . Cancer Mother        breast  . Cancer Father        lymphoma  . Cancer Sister        uterine  . Cancer Sister        Breast CA    ALLERGIES:  is allergic to typhoid vaccines; lobster [shellfish allergy]; and celebrex [celecoxib].  MEDICATIONS:  Current Outpatient Medications  Medication Sig Dispense Refill  . atorvastatin (LIPITOR) 80 MG tablet Take 80 mg by mouth daily.    . cetirizine (  ZYRTEC) 10 MG tablet Take 10 mg by mouth daily as needed for allergies.    . chlorthalidone (HYGROTON) 25 MG tablet TAKE 1 TABLET(25 MG) BY MOUTH DAILY 90 tablet 3  . fluticasone (FLONASE) 50 MCG/ACT nasal spray Place 2 sprays into both nostrils daily.    . irbesartan (AVAPRO) 150 MG tablet Take 150 mg by mouth daily.   2  . raloxifene (EVISTA) 60 MG tablet Take 60 mg by mouth every morning.     Marland Kitchen VAGIFEM 10 MCG TABS vaginal tablet Place 10 mcg vaginally once a week.   4   No current facility-administered medications for this visit.    She has recent cold-like illness with some minor congestion.  Denies fever or chills  REVIEW OF SYSTEMS:   Constitutional: Denies fevers, chills or abnormal night sweats Eyes: Denies blurriness of vision, double vision or watery eyes Ears, nose, mouth, throat, and face: Denies mucositis or sore throat Respiratory: Denies cough, dyspnea or wheezes Cardiovascular: Denies palpitation, chest discomfort or lower extremity swelling Gastrointestinal:  Denies nausea, heartburn or change in bowel habits Skin: Denies abnormal skin rashes Lymphatics: Denies new lymphadenopathy or easy bruising Neurological:Denies numbness, tingling or new weaknesses Behavioral/Psych: Mood is stable, no new changes  All other systems were reviewed with the patient and are negative.  PHYSICAL EXAMINATION: ECOG  PERFORMANCE STATUS: 1 - Symptomatic but completely ambulatory  Vitals:   07/26/18 0910  BP: 139/68  Pulse: 85  Resp: 18  Temp: 97.6 F (36.4 C)  SpO2: 100%   Filed Weights   07/26/18 0910  Weight: 139 lb 3.2 oz (63.1 kg)    GENERAL:alert, no distress and comfortable SKIN: skin color, texture, turgor are normal, no rashes or significant lesions EYES: normal, conjunctiva are pink and non-injected, sclera clear OROPHARYNX:no exudate, no erythema and lips, buccal mucosa, and tongue normal  NECK: supple, thyroid normal size, non-tender, without nodularity LYMPH:  no palpable lymphadenopathy in the cervical, axillary or inguinal LUNGS: clear to auscultation and percussion with normal breathing effort HEART: regular rate & rhythm and no murmurs and no lower extremity edema ABDOMEN:abdomen soft, non-tender and normal bowel sounds Musculoskeletal:no cyanosis of digits and no clubbing  PSYCH: alert & oriented x 3 with fluent speech NEURO: no focal motor/sensory deficits  LABORATORY DATA:  I have reviewed the data as listed Lab Results  Component Value Date   WBC 12.3 (H) 01/25/2018   HGB 11.5 (L) 01/25/2018   HCT 35.3 (L) 01/25/2018   MCV 92.2 01/25/2018   PLT 190 01/25/2018   Recent Labs    01/23/18 1222 01/25/18 0527  NA 143 140  K 5.8* 4.2  CL 105 109  CO2 28 23  GLUCOSE 108* 133*  BUN 24* 17  CREATININE 0.84 0.75  CALCIUM 10.6* 8.3*  GFRNONAA >60 >60  GFRAA >60 >60    I spent 30 minutes counseling the patient face to face. The total time spent in the appointment was 40 minutes and more than 50% was on counseling.  All questions were answered. The patient knows to call the clinic with any problems, questions or concerns.  Heath Lark, MD 07/27/2018 10:29 AM

## 2018-07-27 NOTE — Assessment & Plan Note (Addendum)
The patient has recurrent bleeding history in the past, suspicious for possible bleeding disorder In 2014, one of her physicians have ordered screening test for von Willebrand disease Her von Willebrand factor level, ristocetin cofactor activity and factor VIII level were within normal limits It is possible that she might have platelet disorders that could be contributing to her excessive bleeding risk I recommend repeat baseline coagulation study and platelet aggregation study for further evaluation I will call her with test results

## 2018-07-28 DIAGNOSIS — Z23 Encounter for immunization: Secondary | ICD-10-CM | POA: Diagnosis not present

## 2018-07-28 DIAGNOSIS — Z1331 Encounter for screening for depression: Secondary | ICD-10-CM | POA: Diagnosis not present

## 2018-07-28 DIAGNOSIS — E559 Vitamin D deficiency, unspecified: Secondary | ICD-10-CM | POA: Diagnosis not present

## 2018-07-28 DIAGNOSIS — M25532 Pain in left wrist: Secondary | ICD-10-CM | POA: Diagnosis not present

## 2018-07-28 DIAGNOSIS — Z1339 Encounter for screening examination for other mental health and behavioral disorders: Secondary | ICD-10-CM | POA: Diagnosis not present

## 2018-07-28 DIAGNOSIS — B353 Tinea pedis: Secondary | ICD-10-CM | POA: Diagnosis not present

## 2018-07-28 DIAGNOSIS — Z0000 Encounter for general adult medical examination without abnormal findings: Secondary | ICD-10-CM | POA: Diagnosis not present

## 2018-07-28 DIAGNOSIS — M25571 Pain in right ankle and joints of right foot: Secondary | ICD-10-CM | POA: Diagnosis not present

## 2018-07-28 DIAGNOSIS — Z6822 Body mass index (BMI) 22.0-22.9, adult: Secondary | ICD-10-CM | POA: Diagnosis not present

## 2018-07-28 DIAGNOSIS — M1611 Unilateral primary osteoarthritis, right hip: Secondary | ICD-10-CM | POA: Diagnosis not present

## 2018-07-28 DIAGNOSIS — R7301 Impaired fasting glucose: Secondary | ICD-10-CM | POA: Diagnosis not present

## 2018-07-28 DIAGNOSIS — N183 Chronic kidney disease, stage 3 (moderate): Secondary | ICD-10-CM | POA: Diagnosis not present

## 2018-07-28 DIAGNOSIS — D689 Coagulation defect, unspecified: Secondary | ICD-10-CM | POA: Diagnosis not present

## 2018-08-03 ENCOUNTER — Encounter: Payer: Self-pay | Admitting: Hematology and Oncology

## 2018-08-03 ENCOUNTER — Inpatient Hospital Stay: Payer: Medicare Other

## 2018-08-03 ENCOUNTER — Other Ambulatory Visit: Payer: Self-pay | Admitting: *Deleted

## 2018-08-03 DIAGNOSIS — Z803 Family history of malignant neoplasm of breast: Secondary | ICD-10-CM | POA: Diagnosis not present

## 2018-08-03 DIAGNOSIS — Z808 Family history of malignant neoplasm of other organs or systems: Secondary | ICD-10-CM | POA: Diagnosis not present

## 2018-08-03 DIAGNOSIS — D699 Hemorrhagic condition, unspecified: Secondary | ICD-10-CM

## 2018-08-03 DIAGNOSIS — Z807 Family history of other malignant neoplasms of lymphoid, hematopoietic and related tissues: Secondary | ICD-10-CM | POA: Diagnosis not present

## 2018-08-03 DIAGNOSIS — Z96641 Presence of right artificial hip joint: Secondary | ICD-10-CM | POA: Diagnosis not present

## 2018-08-03 LAB — COMPREHENSIVE METABOLIC PANEL
ALT: 24 U/L (ref 0–44)
AST: 23 U/L (ref 15–41)
Albumin: 4.2 g/dL (ref 3.5–5.0)
Alkaline Phosphatase: 77 U/L (ref 38–126)
Anion gap: 10 (ref 5–15)
BILIRUBIN TOTAL: 0.9 mg/dL (ref 0.3–1.2)
BUN: 19 mg/dL (ref 8–23)
CO2: 27 mmol/L (ref 22–32)
CREATININE: 0.94 mg/dL (ref 0.44–1.00)
Calcium: 9.9 mg/dL (ref 8.9–10.3)
Chloride: 104 mmol/L (ref 98–111)
GFR calc Af Amer: >60 mL/min (ref >60)
Glucose, Bld: 97 mg/dL (ref 70–99)
Potassium: 3.8 mmol/L (ref 3.5–5.1)
Sodium: 141 mmol/L (ref 135–145)
TOTAL PROTEIN: 7.6 g/dL (ref 6.5–8.1)

## 2018-08-03 LAB — CBC WITH DIFFERENTIAL/PLATELET
Abs Immature Granulocytes: 0.02 10*3/uL (ref 0.00–0.07)
Basophils Absolute: 0 10*3/uL (ref 0.0–0.1)
Basophils Relative: 1 %
EOS ABS: 0.1 10*3/uL (ref 0.0–0.5)
EOS PCT: 1 %
HEMATOCRIT: 41.5 % (ref 36.0–46.0)
Hemoglobin: 13.5 g/dL (ref 12.0–15.0)
Immature Granulocytes: 0 %
LYMPHS ABS: 2.3 10*3/uL (ref 0.7–4.0)
Lymphocytes Relative: 32 %
MCH: 29.1 pg (ref 26.0–34.0)
MCHC: 32.5 g/dL (ref 30.0–36.0)
MCV: 89.4 fL (ref 80.0–100.0)
MONOS PCT: 7 %
Monocytes Absolute: 0.5 10*3/uL (ref 0.1–1.0)
NRBC: 0 % (ref 0.0–0.2)
Neutro Abs: 4.2 10*3/uL (ref 1.7–7.7)
Neutrophils Relative %: 59 %
Platelets: 243 10*3/uL (ref 150–400)
RBC: 4.64 MIL/uL (ref 3.87–5.11)
RDW: 13.2 % (ref 11.5–15.5)
WBC: 7.2 10*3/uL (ref 4.0–10.5)

## 2018-08-03 LAB — PROTIME-INR
INR: 0.8 (ref 0.8–1.2)
Prothrombin Time: 11.4 seconds (ref 11.4–15.2)

## 2018-08-03 LAB — APTT: aPTT: 29 seconds (ref 24–36)

## 2018-08-03 LAB — FIBRINOGEN: FIBRINOGEN: 408 mg/dL (ref 210–475)

## 2018-08-07 ENCOUNTER — Telehealth: Payer: Self-pay | Admitting: Hematology and Oncology

## 2018-08-07 ENCOUNTER — Other Ambulatory Visit: Payer: Self-pay | Admitting: Hematology and Oncology

## 2018-08-07 NOTE — Telephone Encounter (Signed)
I have reviewed her results with her husband, Gerald Stabs. All the test results are within normal limits I have addressed all his questions.

## 2018-08-25 IMAGING — DX DG PORTABLE PELVIS
1 series · 1 of 1 positions shown · non-contrast
Comparison: 01/24/2018.

CLINICAL DATA: Total right hip replacement.

EXAM:
PORTABLE PELVIS 1-2 VIEWS

[pelvis ap]
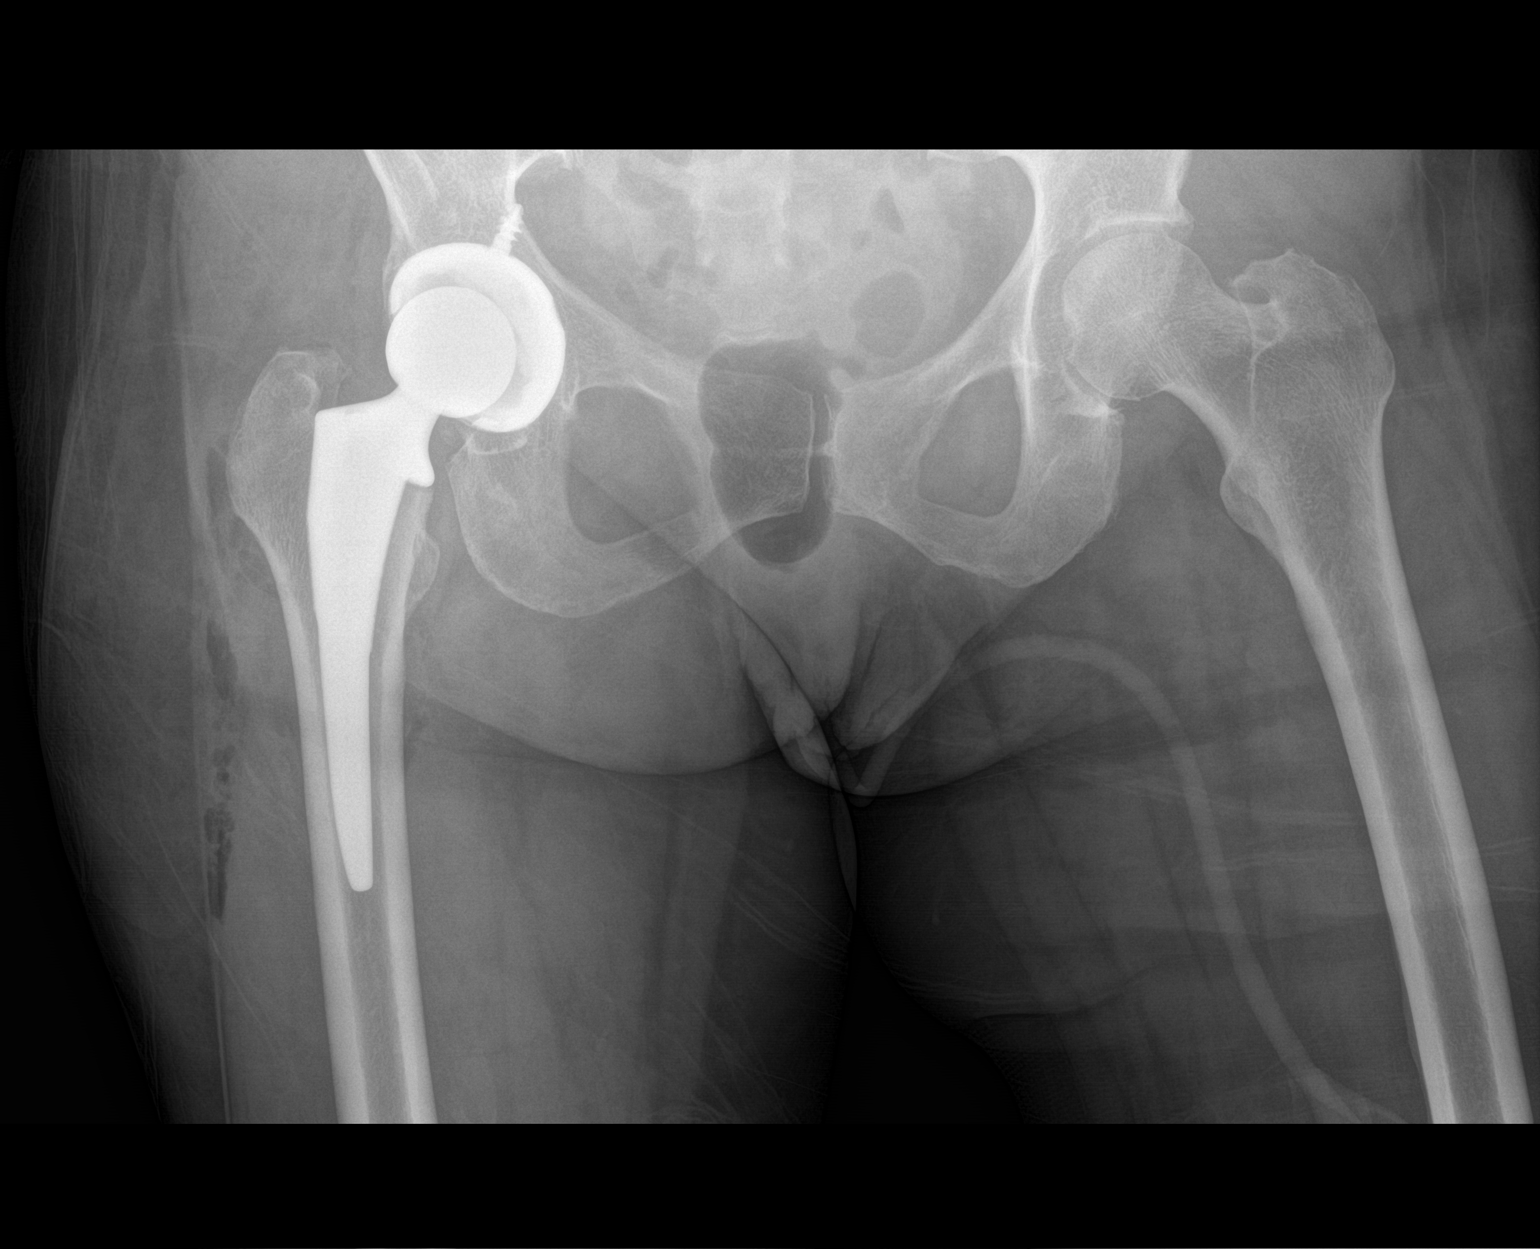

[1 of 1 positions shown; findings below may reference images not displayed]

FINDINGS: Total right hip replacement. Anatomic alignment. Hardware intact. No
acute bony abnormality.
IMPRESSION: Total right hip replacement anatomic alignment.

## 2018-09-27 DIAGNOSIS — L218 Other seborrheic dermatitis: Secondary | ICD-10-CM | POA: Diagnosis not present

## 2018-09-27 DIAGNOSIS — L400 Psoriasis vulgaris: Secondary | ICD-10-CM | POA: Diagnosis not present

## 2019-02-01 DIAGNOSIS — L249 Irritant contact dermatitis, unspecified cause: Secondary | ICD-10-CM | POA: Diagnosis not present

## 2019-02-01 DIAGNOSIS — L400 Psoriasis vulgaris: Secondary | ICD-10-CM | POA: Diagnosis not present

## 2019-02-09 DIAGNOSIS — Z23 Encounter for immunization: Secondary | ICD-10-CM | POA: Diagnosis not present

## 2019-02-23 ENCOUNTER — Ambulatory Visit: Payer: Medicare Other | Admitting: Cardiology

## 2019-02-25 ENCOUNTER — Other Ambulatory Visit: Payer: Self-pay | Admitting: Cardiology

## 2019-03-01 NOTE — Progress Notes (Signed)
HPI The patient presents for followup of mitral valve prolapse.   Since I last saw her she had total hip replacement.   She is doing OK from a cardiac standpoint.  She does get some low blood pressures.  This happens particularly at night when she gets up to the bathroom.  Feels dizzy even after she sits for a while.  She is not had any frank syncope.  Palpitations.  She is not had any new shortness of breath, PND or orthopnea.  She still does her martial arts  Allergies  Allergen Reactions  . Typhoid Vaccines Rash  . Lobster [Shellfish Allergy] Nausea And Vomiting    Lobster only  . Celebrex [Celecoxib] Hives    Current Outpatient Medications  Medication Sig Dispense Refill  . atorvastatin (LIPITOR) 80 MG tablet Take 80 mg by mouth daily.    . fluticasone (FLONASE) 50 MCG/ACT nasal spray Place 2 sprays into both nostrils daily.    . irbesartan (AVAPRO) 150 MG tablet Take 150 mg by mouth daily.   2  . raloxifene (EVISTA) 60 MG tablet Take 60 mg by mouth every morning.     Marland Kitchen VAGIFEM 10 MCG TABS vaginal tablet Place 10 mcg vaginally once a week.   4   No current facility-administered medications for this visit.     Past Medical History:  Diagnosis Date  . Allergy    seasonal allergies, presently upper respiratory symptoms"runny nose, post nasal drip, rare cough_tx. Zpack  . Anemia    hx of blood transfusion 1972 after childbirth hemarrahe no reaction   . Arthritis    oa  . Cataract   . GERD (gastroesophageal reflux disease)    controls with diet and rare OTC meds  . Heart murmur    mvp insufficiency  . Hematoma 20 yrs ago   after breast biopsy  . Hypertension   . Mitral valve prolapse    with moderate mitral regurgitation-LOV Dr. Percival Spanish 10-22-13    Past Surgical History:  Procedure Laterality Date  . ANTERIOR CRUCIATE LIGAMENT REPAIR Left 20 yrs ago  . BREAST BIOPSY Bilateral last 20 yrs ago   of benign lesions right x 2 left x 1  . CHOLECYSTECTOMY N/A  08/02/2014   Procedure: LAPAROSCOPIC CHOLECYSTECTOMY WITH INTRAOPERATIVE CHOLANGIOGRAM;  Surgeon: Pedro Earls, MD;  Location: [facility];  Service: General;  Laterality: N/A;  . EYE SURGERY Bilateral    2016 ioc with lens implants  . left wrist contracture surgery  2018   done under local in office  . minicus Right 20 yrs ago   meniscus repair right knee  . 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 TEE WITHOUT CARDIOVERSION  07/23/2011   Procedure: TRANSESOPHAGEAL ECHOCARDIOGRAM (TEE);  Surgeon: Loralie Champagne, MD;  Location: [place];  Service: Cardiovascular;  Laterality: N/A;  . TOTAL HIP ARTHROPLASTY Right 01/24/2018   Procedure: RIGHT TOTAL HIP ARTHROPLASTY ANTERIOR APPROACH;  Surgeon: Paralee Cancel, MD;  Location: [facility];  Service: Orthopedics;  Laterality: Right;  70 mins  . TUBAL LIGATION  1980 or 1981    ROS:  As stated in the HPI and negative for all other systems.  PHYSICAL EXAM BP 100/60   Temp 97.9 F (36.6 C) (Temporal)   Ht 5' 5.5" (1.664 m)   Wt 141 lb (64 kg)   SpO2 97%   BMI 23.11 kg/m   GENERAL:  Well appearing NECK:  No jugular venous  distention, waveform within normal limits, carotid upstroke brisk and symmetric, no bruits, no thyromegaly LUNGS:  Clear to auscultation bilaterally CHEST:  Unremarkable HEART:  PMI not displaced or sustained,S1 and S2 within normal limits, no S3, no S4, no clicks, no rubs, slight 2 out of 6 apical systolic murmur nonradiating, no diastolic murmurs ABD:  Flat, positive bowel sounds normal in frequency in pitch, no bruits, no rebound, no guarding, no midline pulsatile mass, no hepatomegaly, no splenomegaly EXT:  2 plus pulses throughout, no edema, no cyanosis no clubbing   EKG:  Sinus rhythm, rate 72 .  Attention, axis leftward,  intervals within normal limits, no acute ST-T wave changes poor anterior R wave progression.   03/02/2019   ASSESSMENT AND PLAN  MITRAL  REGURGITATION/AI:  These were mild/moderate.    AI: This was mild to moderate.  I will follow this with physical exam.  I would not expect any changes in this year.  HTN: Blood pressure was low.  We will stop her chlorthalidone and she is getting keep a blood pressure diary.

## 2019-03-02 ENCOUNTER — Encounter: Payer: Self-pay | Admitting: Cardiology

## 2019-03-02 ENCOUNTER — Ambulatory Visit (INDEPENDENT_AMBULATORY_CARE_PROVIDER_SITE_OTHER): Payer: Medicare Other | Admitting: Cardiology

## 2019-03-02 ENCOUNTER — Other Ambulatory Visit: Payer: Self-pay

## 2019-03-02 VITALS — BP 100/60 | Temp 97.9°F | Ht 65.5 in | Wt 141.0 lb

## 2019-03-02 DIAGNOSIS — I351 Nonrheumatic aortic (valve) insufficiency: Secondary | ICD-10-CM

## 2019-03-02 DIAGNOSIS — I059 Rheumatic mitral valve disease, unspecified: Secondary | ICD-10-CM | POA: Diagnosis not present

## 2019-03-02 DIAGNOSIS — I10 Essential (primary) hypertension: Secondary | ICD-10-CM

## 2019-03-02 NOTE — Patient Instructions (Signed)
Medication Instructions:  Stop taking Chlorthalidone.  If you need a refill on your cardiac medications before your next appointment, please call your pharmacy.   Lab work: NONE  Testing/Procedures: NONE  Follow-Up: At [facility], you and your health needs are our priority.  As part of our continuing mission to provide you with exceptional heart care, we have created designated Provider Care Teams.  These Care Teams include your primary Cardiologist (physician) and Advanced Practice Providers (APPs -  Physician Assistants and Nurse Practitioners) who all work together to provide you with the care you need, when you need it. You will need a follow up appointment in 1 years.  Please call our office 2 months in advance to schedule this appointment.  You may see Minus Breeding, MD or one of the following Advanced Practice Providers on your designated Care Team:   Rosaria Ferries, PA-C Jory Sims, DNP, ANP

## 2019-03-14 DIAGNOSIS — L309 Dermatitis, unspecified: Secondary | ICD-10-CM | POA: Diagnosis not present

## 2019-05-16 DIAGNOSIS — Z803 Family history of malignant neoplasm of breast: Secondary | ICD-10-CM | POA: Diagnosis not present

## 2019-05-16 DIAGNOSIS — Z1231 Encounter for screening mammogram for malignant neoplasm of breast: Secondary | ICD-10-CM | POA: Diagnosis not present

## 2019-06-15 DIAGNOSIS — Z961 Presence of intraocular lens: Secondary | ICD-10-CM | POA: Diagnosis not present

## 2019-06-15 DIAGNOSIS — H401134 Primary open-angle glaucoma, bilateral, indeterminate stage: Secondary | ICD-10-CM | POA: Diagnosis not present

## 2019-06-15 DIAGNOSIS — H43813 Vitreous degeneration, bilateral: Secondary | ICD-10-CM | POA: Diagnosis not present

## 2019-07-08 ENCOUNTER — Ambulatory Visit: Payer: Medicare Other

## 2019-07-09 DIAGNOSIS — Z961 Presence of intraocular lens: Secondary | ICD-10-CM | POA: Diagnosis not present

## 2019-07-09 DIAGNOSIS — H401131 Primary open-angle glaucoma, bilateral, mild stage: Secondary | ICD-10-CM | POA: Diagnosis not present

## 2019-07-09 DIAGNOSIS — H43813 Vitreous degeneration, bilateral: Secondary | ICD-10-CM | POA: Diagnosis not present

## 2019-07-13 ENCOUNTER — Ambulatory Visit: Payer: Medicare Other

## 2019-07-14 ENCOUNTER — Ambulatory Visit: Payer: Self-pay

## 2019-07-24 DIAGNOSIS — R7301 Impaired fasting glucose: Secondary | ICD-10-CM | POA: Diagnosis not present

## 2019-07-24 DIAGNOSIS — M8580 Other specified disorders of bone density and structure, unspecified site: Secondary | ICD-10-CM | POA: Diagnosis not present

## 2019-07-24 DIAGNOSIS — M8589 Other specified disorders of bone density and structure, multiple sites: Secondary | ICD-10-CM | POA: Diagnosis not present

## 2019-07-24 DIAGNOSIS — M859 Disorder of bone density and structure, unspecified: Secondary | ICD-10-CM | POA: Diagnosis not present

## 2019-07-24 DIAGNOSIS — E7849 Other hyperlipidemia: Secondary | ICD-10-CM | POA: Diagnosis not present

## 2019-07-31 DIAGNOSIS — D689 Coagulation defect, unspecified: Secondary | ICD-10-CM | POA: Diagnosis not present

## 2019-07-31 DIAGNOSIS — R7301 Impaired fasting glucose: Secondary | ICD-10-CM | POA: Diagnosis not present

## 2019-07-31 DIAGNOSIS — Z79899 Other long term (current) drug therapy: Secondary | ICD-10-CM | POA: Diagnosis not present

## 2019-07-31 DIAGNOSIS — Z0000 Encounter for general adult medical examination without abnormal findings: Secondary | ICD-10-CM | POA: Diagnosis not present

## 2019-07-31 DIAGNOSIS — N183 Chronic kidney disease, stage 3 unspecified: Secondary | ICD-10-CM | POA: Diagnosis not present

## 2019-07-31 DIAGNOSIS — K5900 Constipation, unspecified: Secondary | ICD-10-CM | POA: Diagnosis not present

## 2019-07-31 DIAGNOSIS — E559 Vitamin D deficiency, unspecified: Secondary | ICD-10-CM | POA: Diagnosis not present

## 2019-07-31 DIAGNOSIS — Z1331 Encounter for screening for depression: Secondary | ICD-10-CM | POA: Diagnosis not present

## 2019-08-01 DIAGNOSIS — Z1212 Encounter for screening for malignant neoplasm of rectum: Secondary | ICD-10-CM | POA: Diagnosis not present

## 2019-09-12 DIAGNOSIS — L309 Dermatitis, unspecified: Secondary | ICD-10-CM | POA: Diagnosis not present

## 2019-09-12 DIAGNOSIS — L218 Other seborrheic dermatitis: Secondary | ICD-10-CM | POA: Diagnosis not present

## 2019-09-18 DIAGNOSIS — H43813 Vitreous degeneration, bilateral: Secondary | ICD-10-CM | POA: Diagnosis not present

## 2019-09-18 DIAGNOSIS — Z961 Presence of intraocular lens: Secondary | ICD-10-CM | POA: Diagnosis not present

## 2019-09-18 DIAGNOSIS — H0102A Squamous blepharitis right eye, upper and lower eyelids: Secondary | ICD-10-CM | POA: Diagnosis not present

## 2019-09-18 DIAGNOSIS — H0102B Squamous blepharitis left eye, upper and lower eyelids: Secondary | ICD-10-CM | POA: Diagnosis not present

## 2019-09-18 DIAGNOSIS — H401131 Primary open-angle glaucoma, bilateral, mild stage: Secondary | ICD-10-CM | POA: Diagnosis not present

## 2020-02-26 DIAGNOSIS — Z961 Presence of intraocular lens: Secondary | ICD-10-CM | POA: Diagnosis not present

## 2020-02-26 DIAGNOSIS — H401131 Primary open-angle glaucoma, bilateral, mild stage: Secondary | ICD-10-CM | POA: Diagnosis not present

## 2020-02-26 DIAGNOSIS — H0102A Squamous blepharitis right eye, upper and lower eyelids: Secondary | ICD-10-CM | POA: Diagnosis not present

## 2020-02-26 DIAGNOSIS — H0102B Squamous blepharitis left eye, upper and lower eyelids: Secondary | ICD-10-CM | POA: Diagnosis not present

## 2020-02-26 DIAGNOSIS — H43813 Vitreous degeneration, bilateral: Secondary | ICD-10-CM | POA: Diagnosis not present

## 2020-03-17 DIAGNOSIS — L218 Other seborrheic dermatitis: Secondary | ICD-10-CM | POA: Diagnosis not present

## 2020-03-17 DIAGNOSIS — L309 Dermatitis, unspecified: Secondary | ICD-10-CM | POA: Diagnosis not present

## 2020-03-22 DIAGNOSIS — Z23 Encounter for immunization: Secondary | ICD-10-CM | POA: Diagnosis not present

## 2020-04-05 DIAGNOSIS — Z23 Encounter for immunization: Secondary | ICD-10-CM | POA: Diagnosis not present

## 2020-05-12 DIAGNOSIS — Z20822 Contact with and (suspected) exposure to covid-19: Secondary | ICD-10-CM | POA: Diagnosis not present

## 2020-06-07 DIAGNOSIS — I351 Nonrheumatic aortic (valve) insufficiency: Secondary | ICD-10-CM | POA: Insufficient documentation

## 2020-06-07 NOTE — Progress Notes (Signed)
Cardiology Office Note   Date:  06/09/2020   ID:  Carolyn Berger, DOB [birth year], MRN [id number]  PCP:  Rodrigo Ran, MD  Cardiologist:   Rollene Rotunda, MD    Chief Complaint  Patient presents with  . Heart Murmur      History of Present Illness: Carolyn Berger is a 75 y.o. female who presents for follow up of MVP.   She had mild MR and moderate AI on last echo 2019.  Since I last saw her she has done well.  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  The patient denies any new symptoms such as chest discomfort, neck or arm discomfort. There has been no new shortness of breath, PND or orthopnea. There have been no reported palpitations, presyncope or syncope.    Past Medical History:  Diagnosis Date  . Allergy    seasonal allergies, presently upper respiratory symptoms"runny nose, post nasal drip, rare cough_tx. Zpack  . Anemia    hx of blood transfusion 1972 after childbirth hemarrahe no reaction   . Arthritis    oa  . Cataract   . GERD (gastroesophageal reflux disease)    controls with diet and rare OTC meds  . Heart murmur    mvp insufficiency  . Hematoma 20 yrs ago   after breast biopsy  . Hypertension   . Mitral valve prolapse    with moderate mitral regurgitation-LOV Dr. Antoine Poche 10-22-13    Past Surgical History:  Procedure Laterality Date  . ANTERIOR CRUCIATE LIGAMENT REPAIR Left 20 yrs ago  . BREAST BIOPSY Bilateral last 20 yrs ago   of benign lesions right x 2 left x 1  . CHOLECYSTECTOMY N/A 08/02/2014   Procedure: LAPAROSCOPIC CHOLECYSTECTOMY WITH INTRAOPERATIVE CHOLANGIOGRAM;  Surgeon: Valarie Merino, MD;  Location: [facility];  Service: General;  Laterality: N/A;  . EYE SURGERY Bilateral    2016 ioc with lens implants  . left wrist contracture surgery  2018   done under local in office  . minicus Right 20 yrs ago   meniscus repair right knee  . 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 TEE WITHOUT CARDIOVERSION  07/23/2011   Procedure: TRANSESOPHAGEAL ECHOCARDIOGRAM (TEE);  Surgeon: Marca Ancona, MD;  Location: [facility];  Service: Cardiovascular;  Laterality: N/A;  . TOTAL HIP ARTHROPLASTY Right 01/24/2018   Procedure: RIGHT TOTAL HIP ARTHROPLASTY ANTERIOR APPROACH;  Surgeon: Durene Romans, MD;  Location: [facility];  Service: Orthopedics;  Laterality: Right;  70 mins  . TUBAL LIGATION  1980 or 1981     Current Outpatient Medications  Medication Sig Dispense Refill  . atorvastatin (LIPITOR) 80 MG tablet Take 80 mg by mouth daily.    . fluticasone (FLONASE) 50 MCG/ACT nasal spray Place 2 sprays into both nostrils daily.    . irbesartan (AVAPRO) 150 MG tablet Take 150 mg by mouth daily.   2  . omeprazole (PRILOSEC OTC) 20 MG tablet Take 20 mg by mouth daily.    . raloxifene (EVISTA) 60 MG tablet Take 60 mg by mouth every morning.     Marland Kitchen VAGIFEM 10 MCG TABS vaginal tablet Place 10 mcg vaginally once a week.   4   No current facility-administered medications for this visit.    Allergies:   Typhoid vaccines, Lobster [shellfish allergy], and Celebrex [celecoxib]    ROS:  Please see the history of present illness.  Otherwise, review of systems are positive for none.   All other systems are reviewed and negative.    PHYSICAL EXAM: VS:  BP (!) 162/90   Pulse 61   Ht 5\' 5"  (1.651 m)   Wt 136 lb 6.4 oz (61.9 kg)   BMI 22.70 kg/m  , BMI Body mass index is 22.7 kg/m. GENERAL:  Well appearing NECK:  No jugular venous distention, waveform within normal limits, carotid upstroke brisk and symmetric, no bruits, no thyromegaly LUNGS:  Clear to auscultation bilaterally CHEST:  Unremarkable HEART:  PMI not displaced or sustained,S1 and S2 within normal limits, no S3, no S4, no clicks, no rubs, 2 out of 6 apical systolic murmur early peaking and radiating slightly at the aortic outflow tract, no diastolic murmurs, no axillary murmurs ABD:  Flat, positive bowel  sounds normal in frequency in pitch, no bruits, no rebound, no guarding, no midline pulsatile mass, no hepatomegaly, no splenomegaly EXT:  2 plus pulses throughout, no edema, no cyanosis no clubbing     EKG:  EKG is ordered today. The ekg ordered today demonstrates normal sinus rhythm, rate 61, axis within normal limits, intervals within normal limits, poor anterior R wave progression low voltage throughout.  No change from previous.   Recent Labs: No results found for requested labs within last 8760 hours.    Lipid Panel    Component Value Date/Time   CHOL 248 (H) 10/02/2012 1214   TRIG 149 10/02/2012 1214   HDL 65 10/02/2012 1214   CHOLHDL 3.8 10/02/2012 1214   VLDL 30 10/02/2012 1214   LDLCALC 153 (H) 10/02/2012 1214      Wt Readings from Last 3 Encounters:  06/09/20 136 lb 6.4 oz (61.9 kg)  03/02/19 141 lb (64 kg)  07/26/18 139 lb 3.2 oz (63.1 kg)      Other studies Reviewed: Additional studies/ records that were reviewed today include: Labs. Review of the above records demonstrates: She had a good lipid profile in February and has been followed by Crist Infante, MD  ASSESSMENT AND PLAN:  MITRAL REGURGITATION:   I will check an echocardiogram this year.. Further evaluation plans will be based on these results.  AORTIC INSUFFICIENCY: This will be assessed as above.  HTN: This blood pressure has been high as not seen and very unusual.  She is going through check this more frequently and let me know if it is elevated I probably would increase her Avapro.  Current medicines are reviewed at length with the patient today.  The patient does not have concerns regarding medicines.  The following changes have been made:  no change  Labs/ tests ordered today include: None  Orders Placed This Encounter  Procedures  . EKG 12-Lead  . ECHOCARDIOGRAM COMPLETE     Disposition:   FU with me in 12 months.     Signed, Minus Breeding, MD  06/09/2020 10:05 AM    [facility]

## 2020-06-09 ENCOUNTER — Ambulatory Visit (INDEPENDENT_AMBULATORY_CARE_PROVIDER_SITE_OTHER): Payer: Medicare Other | Admitting: Cardiology

## 2020-06-09 ENCOUNTER — Encounter: Payer: Self-pay | Admitting: Cardiology

## 2020-06-09 ENCOUNTER — Other Ambulatory Visit: Payer: Self-pay

## 2020-06-09 VITALS — BP 162/90 | HR 61 | Ht 65.0 in | Wt 136.4 lb

## 2020-06-09 DIAGNOSIS — I10 Essential (primary) hypertension: Secondary | ICD-10-CM | POA: Diagnosis not present

## 2020-06-09 DIAGNOSIS — I351 Nonrheumatic aortic (valve) insufficiency: Secondary | ICD-10-CM | POA: Diagnosis not present

## 2020-06-09 DIAGNOSIS — I059 Rheumatic mitral valve disease, unspecified: Secondary | ICD-10-CM

## 2020-06-09 NOTE — Patient Instructions (Signed)
Medication Instructions:  No changes *If you need a refill on your cardiac medications before your next appointment, please call your pharmacy*  Lab Work: None ordered this visit  Testing/Procedures: Your physician has requested that you have an echocardiogram. Echocardiography is a painless test that uses sound waves to create images of your heart. It provides your doctor with information about the size and shape of your heart and how well your heart's chambers and valves are working. This procedure takes approximately one hour. There are no restrictions for this procedure. [address]  Follow-Up: At [facility], you and your health needs are our priority.  As part of our continuing mission to provide you with exceptional heart care, we have created designated Provider Care Teams.  These Care Teams include your primary Cardiologist (physician) and Advanced Practice Providers (APPs -  Physician Assistants and Nurse Practitioners) who all work together to provide you with the care you need, when you need it.   Your next appointment:   12 month(s)  You will receive a reminder letter in the mail two months in advance. If you don't receive a letter, please call our office to schedule the follow-up appointment.  The format for your next appointment:   In Person  Provider:   Rollene Rotunda, MD

## 2020-07-01 ENCOUNTER — Other Ambulatory Visit (HOSPITAL_COMMUNITY): Payer: Medicare Other

## 2020-07-08 ENCOUNTER — Other Ambulatory Visit: Payer: Self-pay

## 2020-07-08 ENCOUNTER — Ambulatory Visit (HOSPITAL_COMMUNITY): Payer: Medicare Other | Attending: Internal Medicine

## 2020-07-08 DIAGNOSIS — I351 Nonrheumatic aortic (valve) insufficiency: Secondary | ICD-10-CM | POA: Insufficient documentation

## 2020-07-08 LAB — ECHOCARDIOGRAM COMPLETE
Area-P 1/2: 3.91 cm2
P 1/2 time: 429 msec
S' Lateral: 2.6 cm

## 2020-08-08 DIAGNOSIS — E559 Vitamin D deficiency, unspecified: Secondary | ICD-10-CM | POA: Diagnosis not present

## 2020-08-08 DIAGNOSIS — R7301 Impaired fasting glucose: Secondary | ICD-10-CM | POA: Diagnosis not present

## 2020-08-08 DIAGNOSIS — E785 Hyperlipidemia, unspecified: Secondary | ICD-10-CM | POA: Diagnosis not present

## 2020-08-15 DIAGNOSIS — E785 Hyperlipidemia, unspecified: Secondary | ICD-10-CM | POA: Diagnosis not present

## 2020-08-15 DIAGNOSIS — K219 Gastro-esophageal reflux disease without esophagitis: Secondary | ICD-10-CM | POA: Diagnosis not present

## 2020-08-15 DIAGNOSIS — L409 Psoriasis, unspecified: Secondary | ICD-10-CM | POA: Diagnosis not present

## 2020-08-15 DIAGNOSIS — Z0000 Encounter for general adult medical examination without abnormal findings: Secondary | ICD-10-CM | POA: Diagnosis not present

## 2020-08-15 DIAGNOSIS — M1611 Unilateral primary osteoarthritis, right hip: Secondary | ICD-10-CM | POA: Diagnosis not present

## 2020-08-15 DIAGNOSIS — E559 Vitamin D deficiency, unspecified: Secondary | ICD-10-CM | POA: Diagnosis not present

## 2020-08-15 DIAGNOSIS — R5383 Other fatigue: Secondary | ICD-10-CM | POA: Diagnosis not present

## 2020-08-15 DIAGNOSIS — Z1212 Encounter for screening for malignant neoplasm of rectum: Secondary | ICD-10-CM | POA: Diagnosis not present

## 2020-08-15 DIAGNOSIS — N183 Chronic kidney disease, stage 3 unspecified: Secondary | ICD-10-CM | POA: Diagnosis not present

## 2020-08-15 DIAGNOSIS — D689 Coagulation defect, unspecified: Secondary | ICD-10-CM | POA: Diagnosis not present

## 2020-08-15 DIAGNOSIS — K5900 Constipation, unspecified: Secondary | ICD-10-CM | POA: Diagnosis not present

## 2020-08-15 DIAGNOSIS — J309 Allergic rhinitis, unspecified: Secondary | ICD-10-CM | POA: Diagnosis not present

## 2020-08-15 DIAGNOSIS — M8589 Other specified disorders of bone density and structure, multiple sites: Secondary | ICD-10-CM | POA: Diagnosis not present

## 2020-08-15 DIAGNOSIS — R011 Cardiac murmur, unspecified: Secondary | ICD-10-CM | POA: Diagnosis not present

## 2020-08-15 DIAGNOSIS — E538 Deficiency of other specified B group vitamins: Secondary | ICD-10-CM | POA: Diagnosis not present

## 2020-08-15 DIAGNOSIS — E875 Hyperkalemia: Secondary | ICD-10-CM | POA: Diagnosis not present

## 2020-08-15 DIAGNOSIS — R82998 Other abnormal findings in urine: Secondary | ICD-10-CM | POA: Diagnosis not present

## 2020-08-15 DIAGNOSIS — I10 Essential (primary) hypertension: Secondary | ICD-10-CM | POA: Diagnosis not present

## 2020-08-15 DIAGNOSIS — R7301 Impaired fasting glucose: Secondary | ICD-10-CM | POA: Diagnosis not present

## 2020-08-19 ENCOUNTER — Other Ambulatory Visit: Payer: Self-pay | Admitting: Internal Medicine

## 2020-08-19 DIAGNOSIS — Z9189 Other specified personal risk factors, not elsewhere classified: Secondary | ICD-10-CM

## 2020-08-19 DIAGNOSIS — Z0000 Encounter for general adult medical examination without abnormal findings: Secondary | ICD-10-CM

## 2020-08-28 DIAGNOSIS — Z01419 Encounter for gynecological examination (general) (routine) without abnormal findings: Secondary | ICD-10-CM | POA: Diagnosis not present

## 2020-09-10 DIAGNOSIS — H401131 Primary open-angle glaucoma, bilateral, mild stage: Secondary | ICD-10-CM | POA: Diagnosis not present

## 2020-09-10 DIAGNOSIS — Z961 Presence of intraocular lens: Secondary | ICD-10-CM | POA: Diagnosis not present

## 2020-09-10 DIAGNOSIS — H0102B Squamous blepharitis left eye, upper and lower eyelids: Secondary | ICD-10-CM | POA: Diagnosis not present

## 2020-09-10 DIAGNOSIS — H43813 Vitreous degeneration, bilateral: Secondary | ICD-10-CM | POA: Diagnosis not present

## 2020-09-10 DIAGNOSIS — H0102A Squamous blepharitis right eye, upper and lower eyelids: Secondary | ICD-10-CM | POA: Diagnosis not present

## 2020-09-22 ENCOUNTER — Other Ambulatory Visit: Payer: Self-pay

## 2020-09-22 ENCOUNTER — Ambulatory Visit
Admission: RE | Admit: 2020-09-22 | Discharge: 2020-09-22 | Disposition: A | Discharge status: Home / Self Care | Payer: No Typology Code available for payment source | Source: Ambulatory Visit | Attending: Internal Medicine | Admitting: Internal Medicine

## 2020-09-22 DIAGNOSIS — Z0000 Encounter for general adult medical examination without abnormal findings: Secondary | ICD-10-CM

## 2020-09-22 DIAGNOSIS — Z9189 Other specified personal risk factors, not elsewhere classified: Secondary | ICD-10-CM

## 2020-09-22 MED ORDER — GADOBUTROL 1 MMOL/ML IV SOLN
6.0000 mL | Freq: Once | INTRAVENOUS | Status: AC | PRN
  Administered 2020-09-22: 6 mL via INTRAVENOUS

## 2020-10-06 DIAGNOSIS — L298 Other pruritus: Secondary | ICD-10-CM | POA: Diagnosis not present

## 2020-10-06 DIAGNOSIS — L309 Dermatitis, unspecified: Secondary | ICD-10-CM | POA: Diagnosis not present

## 2020-10-06 DIAGNOSIS — L218 Other seborrheic dermatitis: Secondary | ICD-10-CM | POA: Diagnosis not present

## 2020-10-13 DIAGNOSIS — Z23 Encounter for immunization: Secondary | ICD-10-CM | POA: Diagnosis not present

## 2021-03-13 DIAGNOSIS — M79672 Pain in left foot: Secondary | ICD-10-CM | POA: Diagnosis not present

## 2021-03-13 DIAGNOSIS — M25572 Pain in left ankle and joints of left foot: Secondary | ICD-10-CM | POA: Diagnosis not present

## 2021-03-14 DIAGNOSIS — Z23 Encounter for immunization: Secondary | ICD-10-CM | POA: Diagnosis not present

## 2021-03-30 DIAGNOSIS — M25562 Pain in left knee: Secondary | ICD-10-CM | POA: Diagnosis not present

## 2021-04-14 DIAGNOSIS — D225 Melanocytic nevi of trunk: Secondary | ICD-10-CM | POA: Diagnosis not present

## 2021-04-14 DIAGNOSIS — L814 Other melanin hyperpigmentation: Secondary | ICD-10-CM | POA: Diagnosis not present

## 2021-04-14 DIAGNOSIS — L821 Other seborrheic keratosis: Secondary | ICD-10-CM | POA: Diagnosis not present

## 2021-04-14 DIAGNOSIS — L812 Freckles: Secondary | ICD-10-CM | POA: Diagnosis not present

## 2021-04-20 DIAGNOSIS — M25562 Pain in left knee: Secondary | ICD-10-CM | POA: Diagnosis not present

## 2021-04-22 DIAGNOSIS — S83512A Sprain of anterior cruciate ligament of left knee, initial encounter: Secondary | ICD-10-CM | POA: Diagnosis not present

## 2021-04-22 DIAGNOSIS — S83242A Other tear of medial meniscus, current injury, left knee, initial encounter: Secondary | ICD-10-CM | POA: Diagnosis not present

## 2021-04-22 DIAGNOSIS — S83412A Sprain of medial collateral ligament of left knee, initial encounter: Secondary | ICD-10-CM | POA: Diagnosis not present

## 2021-05-08 DIAGNOSIS — M25562 Pain in left knee: Secondary | ICD-10-CM | POA: Diagnosis not present

## 2021-05-13 DIAGNOSIS — H43813 Vitreous degeneration, bilateral: Secondary | ICD-10-CM | POA: Diagnosis not present

## 2021-05-13 DIAGNOSIS — H26491 Other secondary cataract, right eye: Secondary | ICD-10-CM | POA: Diagnosis not present

## 2021-05-13 DIAGNOSIS — M25562 Pain in left knee: Secondary | ICD-10-CM | POA: Diagnosis not present

## 2021-05-13 DIAGNOSIS — H401131 Primary open-angle glaucoma, bilateral, mild stage: Secondary | ICD-10-CM | POA: Diagnosis not present

## 2021-05-13 DIAGNOSIS — H0102A Squamous blepharitis right eye, upper and lower eyelids: Secondary | ICD-10-CM | POA: Diagnosis not present

## 2021-05-13 DIAGNOSIS — H0102B Squamous blepharitis left eye, upper and lower eyelids: Secondary | ICD-10-CM | POA: Diagnosis not present

## 2021-05-13 DIAGNOSIS — Z961 Presence of intraocular lens: Secondary | ICD-10-CM | POA: Diagnosis not present

## 2021-05-15 DIAGNOSIS — M25562 Pain in left knee: Secondary | ICD-10-CM | POA: Diagnosis not present

## 2021-05-19 DIAGNOSIS — M25562 Pain in left knee: Secondary | ICD-10-CM | POA: Diagnosis not present

## 2021-05-22 DIAGNOSIS — M25562 Pain in left knee: Secondary | ICD-10-CM | POA: Diagnosis not present

## 2021-05-25 DIAGNOSIS — M25562 Pain in left knee: Secondary | ICD-10-CM | POA: Diagnosis not present

## 2021-05-28 DIAGNOSIS — M25562 Pain in left knee: Secondary | ICD-10-CM | POA: Diagnosis not present

## 2021-06-02 DIAGNOSIS — M25562 Pain in left knee: Secondary | ICD-10-CM | POA: Diagnosis not present

## 2021-06-05 DIAGNOSIS — M25562 Pain in left knee: Secondary | ICD-10-CM | POA: Diagnosis not present

## 2021-06-05 DIAGNOSIS — H60503 Unspecified acute noninfective otitis externa, bilateral: Secondary | ICD-10-CM | POA: Diagnosis not present

## 2021-06-05 DIAGNOSIS — H9203 Otalgia, bilateral: Secondary | ICD-10-CM | POA: Diagnosis not present

## 2021-06-05 DIAGNOSIS — L409 Psoriasis, unspecified: Secondary | ICD-10-CM | POA: Diagnosis not present

## 2021-06-08 NOTE — Progress Notes (Signed)
Cardiology Office Note   Date:  06/09/2021   ID:  TREY BEBEE, DOB 1945/10/22, MRN [id number]  PCP:  Crist Infante, MD  Cardiologist:   Minus Breeding, MD    Chief Complaint  Patient presents with   Valvular Heart Disease      History of Present Illness: Carolyn Berger is a 76 y.o. female who presents for follow up of MVP.   She had mild MR and moderate AI on last echo in Feb 2022.  Since I last saw her she has done well.  The patient denies any new symptoms such as chest discomfort, neck or arm discomfort. There has been no new shortness of breath, PND or orthopnea. There have been no reported palpitations, presyncope or syncope.   She unfortunately tore her meniscus recently and she has been limiting her physical activities.  She has been back to the gym doing some elliptical and bike training without bringing on any symptoms.  Past Medical History:  Diagnosis Date   Allergy    seasonal allergies, presently upper respiratory symptoms"runny nose, post nasal drip, rare cough_tx. Zpack   Anemia    hx of blood transfusion 1972 after childbirth hemarrahe no reaction    Arthritis    oa   Cataract    GERD (gastroesophageal reflux disease)    controls with diet and rare OTC meds   Heart murmur    mvp insufficiency   Hematoma 20 yrs ago   after breast biopsy   Hypertension    Mitral valve prolapse    with moderate mitral regurgitation-LOV Dr. Percival Spanish 10-22-13    Past Surgical History:  Procedure Laterality Date   ANTERIOR CRUCIATE LIGAMENT REPAIR Left 20 yrs ago   BREAST BIOPSY Bilateral last 20 yrs ago   of benign lesions right x 2 left x 1   CHOLECYSTECTOMY N/A 08/02/2014   Procedure: LAPAROSCOPIC CHOLECYSTECTOMY WITH INTRAOPERATIVE CHOLANGIOGRAM;  Surgeon: Pedro Earls, MD;  Location: [facility];  Service: General;  Laterality: N/A;   EYE SURGERY Bilateral    2016 ioc with lens implants   left wrist contracture surgery  2018   done under local in office    minicus Right 20 yrs ago   meniscus repair right knee   SPINE SURGERY  08/13/2016   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     esomeprazole (NEXIUM) 20 MG capsule Take 20 mg by mouth daily.     fluticasone (FLONASE) 50 MCG/ACT nasal spray Place 2 sprays into both nostrils daily.     irbesartan (AVAPRO) 150 MG tablet Take 150 mg by mouth daily.   2   NEOMYCIN-POLYMYXIN-HYDROCORTISONE (CORTISPORIN) 1 % SOLN OTIC solution Place 4 drops into the right ear in the morning, at noon, in the evening, and at bedtime.     raloxifene (EVISTA) 60 MG tablet Take 60 mg by mouth every morning.      VAGIFEM 10 MCG  TABS vaginal tablet Place 10 mcg vaginally once a week.   4   omeprazole (PRILOSEC OTC) 20 MG tablet Take 20 mg by mouth daily.     No current facility-administered medications for this visit.    Allergies:   Typhoid vaccines, Lobster [shellfish allergy], and Celebrex [celecoxib]    ROS:  Please see the history of present illness.   Otherwise, review of systems are positive for none.   All other systems are reviewed and negative.    PHYSICAL EXAM: VS:  BP 122/72 (BP Location: Left Arm, Patient Position: Sitting, Cuff Size: Normal)    Pulse 76    Ht 5\' 5"  (1.651 m)    Wt 151 lb 3.2 oz (68.6 kg)    SpO2 97%    BMI 25.16 kg/m  , BMI Body mass index is 25.16 kg/m. GENERAL:  Well appearing NECK:  No jugular venous distention,  waveform within normal limits, carotid upstroke brisk and symmetric, no bruits, no thyromegaly LUNGS:  Clear to auscultation bilaterally CHEST:  Unremarkable HEART:  PMI not displaced or sustained,S1 and S2 within normal limits, no S3, no S4, no clicks, no rubs, 2 out of 6 brief apical and left mid sternal border systolic murmur, no diastolic murmurs ABD:  Flat, positive bowel sounds normal in frequency in pitch, no bruits, no rebound, no guarding, no midline pulsatile mass, no hepatomegaly, no splenomegaly EXT:  2 plus pulses throughout, no edema, no cyanosis no clubbing   EKG:  EKG is  ordered today. The ekg ordered today demonstrates normal sinus rhythm, rate 76, axis within normal limits, intervals within normal limits, poor anterior R wave progression low voltage throughout.  No change from previous.  No change from previous.   Recent Labs: No results found for requested labs within last 8760 hours.    Lipid Panel    Component Value Date/Time   CHOL 248 (H) 10/02/2012 1214   TRIG 149 10/02/2012 1214   HDL 65 10/02/2012 1214   CHOLHDL 3.8 10/02/2012 1214   VLDL 30 10/02/2012 1214   LDLCALC 153 (H) 10/02/2012 1214      Wt Readings from Last 3 Encounters:  06/09/21 151 lb 3.2 oz (68.6 kg)  06/09/20 136 lb 6.4 oz (61.9 kg)  03/02/19 141 lb (64 kg)      Other studies Reviewed: Additional studies/ records that were reviewed today include: Old EKG, labs. Review of the above records demonstrates: See elsewhere ASSESSMENT AND PLAN:  MITRAL REGURGITATION:   I will follow up with an echo .    AORTIC INSUFFICIENCY:  This will be followed as above.   HTN: This blood pressure is at target.  No change in therapy   current medicines are reviewed at length with the patient today.  The patient does not have concerns regarding medicines.  The following changes have been made:  None  Labs/ tests ordered today include: None  Orders Placed This Encounter  Procedures   EKG 12-Lead    ECHOCARDIOGRAM COMPLETE     Disposition:   FU with me in 12 months.     Signed, Minus Breeding, MD  06/09/2021 1:44 PM    [facility]

## 2021-06-09 ENCOUNTER — Encounter: Payer: Self-pay | Admitting: Cardiology

## 2021-06-09 ENCOUNTER — Other Ambulatory Visit: Payer: Self-pay

## 2021-06-09 ENCOUNTER — Ambulatory Visit (INDEPENDENT_AMBULATORY_CARE_PROVIDER_SITE_OTHER): Payer: Medicare Other | Admitting: Cardiology

## 2021-06-09 VITALS — BP 122/72 | HR 76 | Ht 65.0 in | Wt 151.2 lb

## 2021-06-09 DIAGNOSIS — I351 Nonrheumatic aortic (valve) insufficiency: Secondary | ICD-10-CM | POA: Diagnosis not present

## 2021-06-09 DIAGNOSIS — I059 Rheumatic mitral valve disease, unspecified: Secondary | ICD-10-CM | POA: Diagnosis not present

## 2021-06-09 DIAGNOSIS — I10 Essential (primary) hypertension: Secondary | ICD-10-CM

## 2021-06-09 NOTE — Patient Instructions (Signed)
Medication Instructions:  Your Physician recommend you continue on your current medication as directed.    *If you need a refill on your cardiac medications before your next appointment, please call your pharmacy*   Testing/Procedures: Your physician has requested that you have an echocardiogram. Echocardiography is a painless test that uses sound waves to create images of your heart. It provides your doctor with information about the size and shape of your heart and how well your heart's chambers and valves are working. This procedure takes approximately one hour. There are no restrictions for this procedure.   Follow-Up: At [facility], you and your health needs are our priority.  As part of our continuing mission to provide you with exceptional heart care, we have created designated Provider Care Teams.  These Care Teams include your primary Cardiologist (physician) and Advanced Practice Providers (APPs -  Physician Assistants and Nurse Practitioners) who all work together to provide you with the care you need, when you need it.  We recommend signing up for the patient portal called "MyChart".  Sign up information is provided on this After Visit Summary.  MyChart is used to connect with patients for Virtual Visits (Telemedicine).  Patients are able to view lab/test results, encounter notes, upcoming appointments, etc.  Non-urgent messages can be sent to your provider as well.   To learn more about what you can do with MyChart, go to [email or web address].    Your next appointment:   12 month(s)  The format for your next appointment:   In Person  Provider:   Minus Breeding, MD

## 2021-06-15 DIAGNOSIS — M25562 Pain in left knee: Secondary | ICD-10-CM | POA: Diagnosis not present

## 2021-06-19 ENCOUNTER — Telehealth: Payer: Self-pay

## 2021-06-19 NOTE — Telephone Encounter (Signed)
° °  Pre-operative Risk Assessment    Patient Name: Carolyn Berger  DOB: 11/15/1945 MRN: [id number]      Request for Surgical Clearance    Procedure:   Left knee arthroscopic partial medial meniscus  Date of Surgery:  Clearance 07/02/21                                 Surgeon:  Dr. Ernestene Mention  Surgeon's Group or Practice Name:  [facility] Phone number:  [phone] Fax number:  [phone]   Type of Clearance Requested:   - Medical    Type of Anesthesia:   General with knee block    Additional requests/questions:    SignedJacqulynn Cadet   06/19/2021, 2:23 PM

## 2021-06-19 NOTE — Telephone Encounter (Signed)
Pending echo on 1/20

## 2021-06-24 DIAGNOSIS — M25562 Pain in left knee: Secondary | ICD-10-CM | POA: Diagnosis not present

## 2021-06-25 DIAGNOSIS — Z1231 Encounter for screening mammogram for malignant neoplasm of breast: Secondary | ICD-10-CM | POA: Diagnosis not present

## 2021-06-26 ENCOUNTER — Other Ambulatory Visit: Payer: Self-pay

## 2021-06-26 ENCOUNTER — Ambulatory Visit (HOSPITAL_COMMUNITY): Payer: Medicare Other | Attending: Cardiovascular Disease

## 2021-06-26 DIAGNOSIS — I059 Rheumatic mitral valve disease, unspecified: Secondary | ICD-10-CM | POA: Insufficient documentation

## 2021-06-26 DIAGNOSIS — M25562 Pain in left knee: Secondary | ICD-10-CM | POA: Diagnosis not present

## 2021-06-26 LAB — ECHOCARDIOGRAM COMPLETE
Area-P 1/2: 3.03 cm2
MV M vel: 5.5 m/s
MV Peak grad: 120.8 mmHg
P 1/2 time: 537 msec
S' Lateral: 2.9 cm

## 2021-06-29 NOTE — Telephone Encounter (Signed)
° °  Primary Cardiologist: Minus Breeding, MD  Chart reviewed as part of pre-operative protocol coverage. Given past medical history and time since last visit, based on ACC/AHA guidelines, Carolyn Berger would be at acceptable risk for the planned procedure without further cardiovascular testing.   Her RCRI is a class I risk, 0.4% risk of major cardiac event.  I will route this recommendation to the requesting party via Epic fax function and remove from pre-op pool.  Please call with questions.  Jossie Ng. Yaiden Yang NP-C    06/29/2021, 4:41 PM [facility] [place] [address] Office [phone] Fax [phone]

## 2021-06-30 DIAGNOSIS — M25562 Pain in left knee: Secondary | ICD-10-CM | POA: Diagnosis not present

## 2021-07-02 DIAGNOSIS — G8918 Other acute postprocedural pain: Secondary | ICD-10-CM | POA: Diagnosis not present

## 2021-07-02 DIAGNOSIS — Y999 Unspecified external cause status: Secondary | ICD-10-CM | POA: Diagnosis not present

## 2021-07-02 DIAGNOSIS — S83232A Complex tear of medial meniscus, current injury, left knee, initial encounter: Secondary | ICD-10-CM | POA: Diagnosis not present

## 2021-07-02 DIAGNOSIS — M94262 Chondromalacia, left knee: Secondary | ICD-10-CM | POA: Diagnosis not present

## 2021-07-02 DIAGNOSIS — X58XXXA Exposure to other specified factors, initial encounter: Secondary | ICD-10-CM | POA: Diagnosis not present

## 2021-07-09 DIAGNOSIS — M25562 Pain in left knee: Secondary | ICD-10-CM | POA: Diagnosis not present

## 2021-07-19 DIAGNOSIS — L02214 Cutaneous abscess of groin: Secondary | ICD-10-CM | POA: Diagnosis not present

## 2021-07-29 DIAGNOSIS — M25562 Pain in left knee: Secondary | ICD-10-CM | POA: Diagnosis not present

## 2021-08-05 DIAGNOSIS — M25562 Pain in left knee: Secondary | ICD-10-CM | POA: Diagnosis not present

## 2021-08-07 DIAGNOSIS — M25562 Pain in left knee: Secondary | ICD-10-CM | POA: Diagnosis not present

## 2021-08-10 DIAGNOSIS — M25562 Pain in left knee: Secondary | ICD-10-CM | POA: Diagnosis not present

## 2021-08-13 DIAGNOSIS — M25562 Pain in left knee: Secondary | ICD-10-CM | POA: Diagnosis not present

## 2021-08-17 DIAGNOSIS — M25562 Pain in left knee: Secondary | ICD-10-CM | POA: Diagnosis not present

## 2021-08-20 DIAGNOSIS — M25562 Pain in left knee: Secondary | ICD-10-CM | POA: Diagnosis not present

## 2021-09-03 DIAGNOSIS — E559 Vitamin D deficiency, unspecified: Secondary | ICD-10-CM | POA: Diagnosis not present

## 2021-09-03 DIAGNOSIS — I10 Essential (primary) hypertension: Secondary | ICD-10-CM | POA: Diagnosis not present

## 2021-09-03 DIAGNOSIS — E785 Hyperlipidemia, unspecified: Secondary | ICD-10-CM | POA: Diagnosis not present

## 2021-09-03 DIAGNOSIS — M8589 Other specified disorders of bone density and structure, multiple sites: Secondary | ICD-10-CM | POA: Diagnosis not present

## 2021-09-03 DIAGNOSIS — R7301 Impaired fasting glucose: Secondary | ICD-10-CM | POA: Diagnosis not present

## 2021-09-10 DIAGNOSIS — Z0000 Encounter for general adult medical examination without abnormal findings: Secondary | ICD-10-CM | POA: Diagnosis not present

## 2021-09-10 DIAGNOSIS — J309 Allergic rhinitis, unspecified: Secondary | ICD-10-CM | POA: Diagnosis not present

## 2021-09-10 DIAGNOSIS — H9391 Unspecified disorder of right ear: Secondary | ICD-10-CM | POA: Diagnosis not present

## 2021-09-10 DIAGNOSIS — M8580 Other specified disorders of bone density and structure, unspecified site: Secondary | ICD-10-CM | POA: Diagnosis not present

## 2021-09-10 DIAGNOSIS — Z1212 Encounter for screening for malignant neoplasm of rectum: Secondary | ICD-10-CM | POA: Diagnosis not present

## 2021-09-10 DIAGNOSIS — K219 Gastro-esophageal reflux disease without esophagitis: Secondary | ICD-10-CM | POA: Diagnosis not present

## 2021-09-10 DIAGNOSIS — Z1331 Encounter for screening for depression: Secondary | ICD-10-CM | POA: Diagnosis not present

## 2021-09-10 DIAGNOSIS — R1013 Epigastric pain: Secondary | ICD-10-CM | POA: Diagnosis not present

## 2021-09-10 DIAGNOSIS — R82998 Other abnormal findings in urine: Secondary | ICD-10-CM | POA: Diagnosis not present

## 2021-09-10 DIAGNOSIS — R011 Cardiac murmur, unspecified: Secondary | ICD-10-CM | POA: Diagnosis not present

## 2021-09-10 DIAGNOSIS — R7301 Impaired fasting glucose: Secondary | ICD-10-CM | POA: Diagnosis not present

## 2021-09-10 DIAGNOSIS — D689 Coagulation defect, unspecified: Secondary | ICD-10-CM | POA: Diagnosis not present

## 2021-09-10 DIAGNOSIS — I10 Essential (primary) hypertension: Secondary | ICD-10-CM | POA: Diagnosis not present

## 2021-09-10 DIAGNOSIS — Z1339 Encounter for screening examination for other mental health and behavioral disorders: Secondary | ICD-10-CM | POA: Diagnosis not present

## 2021-09-17 ENCOUNTER — Other Ambulatory Visit: Payer: Self-pay | Admitting: Internal Medicine

## 2021-09-17 DIAGNOSIS — R1013 Epigastric pain: Secondary | ICD-10-CM

## 2021-09-22 ENCOUNTER — Ambulatory Visit
Admission: RE | Admit: 2021-09-22 | Discharge: 2021-09-22 | Disposition: A | Discharge status: Home / Self Care | Payer: Medicare Other | Source: Ambulatory Visit | Attending: Internal Medicine | Admitting: Internal Medicine

## 2021-09-22 DIAGNOSIS — R1013 Epigastric pain: Secondary | ICD-10-CM | POA: Diagnosis not present

## 2021-09-22 DIAGNOSIS — K6389 Other specified diseases of intestine: Secondary | ICD-10-CM | POA: Diagnosis not present

## 2021-09-22 DIAGNOSIS — M4186 Other forms of scoliosis, lumbar region: Secondary | ICD-10-CM | POA: Diagnosis not present

## 2021-09-22 DIAGNOSIS — M47816 Spondylosis without myelopathy or radiculopathy, lumbar region: Secondary | ICD-10-CM | POA: Diagnosis not present

## 2021-09-22 MED ORDER — IOPAMIDOL (ISOVUE-300) INJECTION 61%
100.0000 mL | Freq: Once | INTRAVENOUS | Status: AC | PRN
  Administered 2021-09-22: 100 mL via INTRAVENOUS

## 2021-11-04 DIAGNOSIS — M1712 Unilateral primary osteoarthritis, left knee: Secondary | ICD-10-CM | POA: Diagnosis not present

## 2021-11-10 DIAGNOSIS — H0102A Squamous blepharitis right eye, upper and lower eyelids: Secondary | ICD-10-CM | POA: Diagnosis not present

## 2021-11-10 DIAGNOSIS — H401131 Primary open-angle glaucoma, bilateral, mild stage: Secondary | ICD-10-CM | POA: Diagnosis not present

## 2021-11-10 DIAGNOSIS — H0102B Squamous blepharitis left eye, upper and lower eyelids: Secondary | ICD-10-CM | POA: Diagnosis not present

## 2021-11-10 DIAGNOSIS — H26491 Other secondary cataract, right eye: Secondary | ICD-10-CM | POA: Diagnosis not present

## 2021-11-10 DIAGNOSIS — Z961 Presence of intraocular lens: Secondary | ICD-10-CM | POA: Diagnosis not present

## 2021-11-10 DIAGNOSIS — H43813 Vitreous degeneration, bilateral: Secondary | ICD-10-CM | POA: Diagnosis not present

## 2021-11-11 DIAGNOSIS — M1712 Unilateral primary osteoarthritis, left knee: Secondary | ICD-10-CM | POA: Diagnosis not present

## 2021-11-26 DIAGNOSIS — H903 Sensorineural hearing loss, bilateral: Secondary | ICD-10-CM | POA: Insufficient documentation

## 2021-12-02 DIAGNOSIS — Z23 Encounter for immunization: Secondary | ICD-10-CM | POA: Diagnosis not present

## 2022-01-04 DIAGNOSIS — M1712 Unilateral primary osteoarthritis, left knee: Secondary | ICD-10-CM | POA: Diagnosis not present

## 2022-02-17 DIAGNOSIS — M1712 Unilateral primary osteoarthritis, left knee: Secondary | ICD-10-CM | POA: Diagnosis not present

## 2022-03-07 DIAGNOSIS — Z23 Encounter for immunization: Secondary | ICD-10-CM | POA: Diagnosis not present

## 2022-04-14 DIAGNOSIS — D225 Melanocytic nevi of trunk: Secondary | ICD-10-CM | POA: Diagnosis not present

## 2022-04-14 DIAGNOSIS — L814 Other melanin hyperpigmentation: Secondary | ICD-10-CM | POA: Diagnosis not present

## 2022-04-14 DIAGNOSIS — L720 Epidermal cyst: Secondary | ICD-10-CM | POA: Diagnosis not present

## 2022-04-14 DIAGNOSIS — L821 Other seborrheic keratosis: Secondary | ICD-10-CM | POA: Diagnosis not present

## 2022-04-23 IMAGING — CT CT ABD-PELV W/ CM
1 of 3 series · 12 of 32 positions shown, 18 images · IV contrast (APPLIED)
Comparison: 04/05/2014.

CLINICAL DATA: Epigastric pain for several weeks. History of a
cholecystectomy.

EXAM:
CT ABDOMEN AND PELVIS WITH CONTRAST
TECHNIQUE: Multidetector CT imaging of the abdomen and pelvis was performed
using the standard protocol following bolus administration of
intravenous contrast.

[Series 2: abd/pelvis w/cm · axial · 0.79mm/px · z∈[+912,+1252]mm · 12 of 82 slices shown, 18 images]
[im 7/82  soft-tissue]
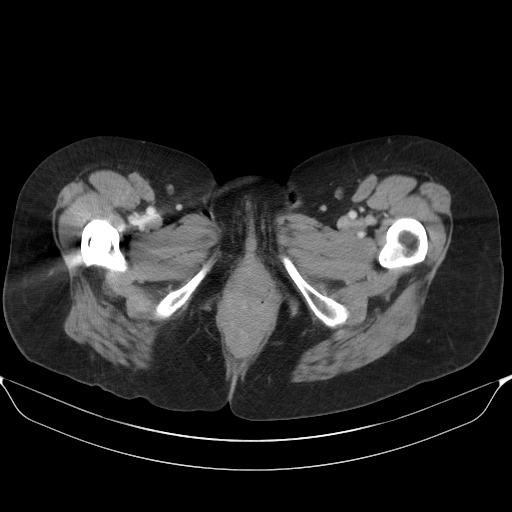
[im 7/82  bone]
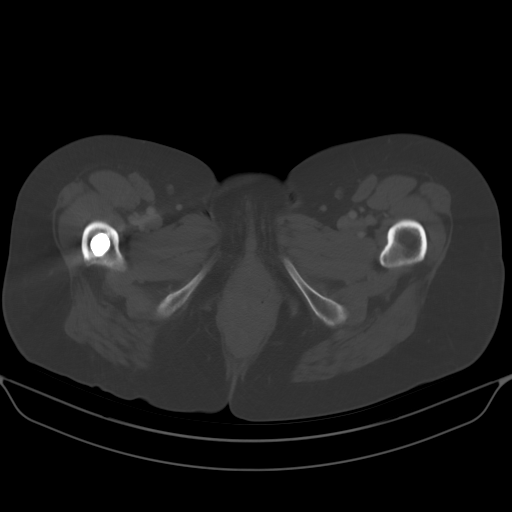
[im 13/82  soft-tissue]
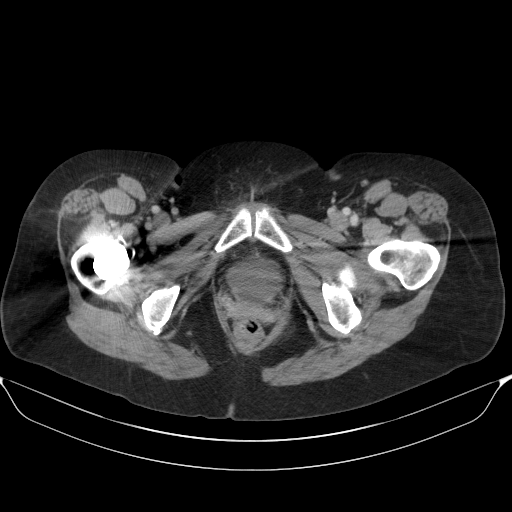
[im 19/82  soft-tissue]
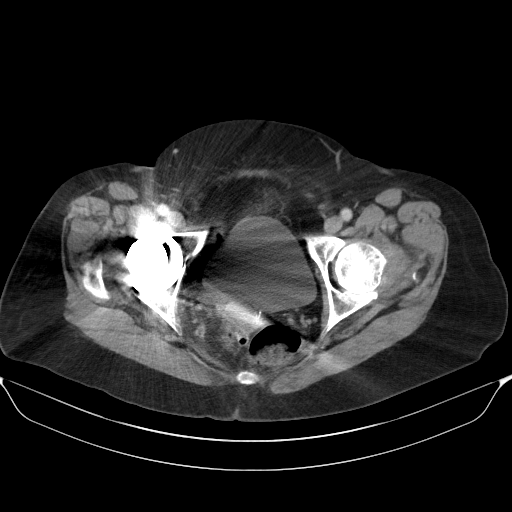
[im 25/82  soft-tissue]
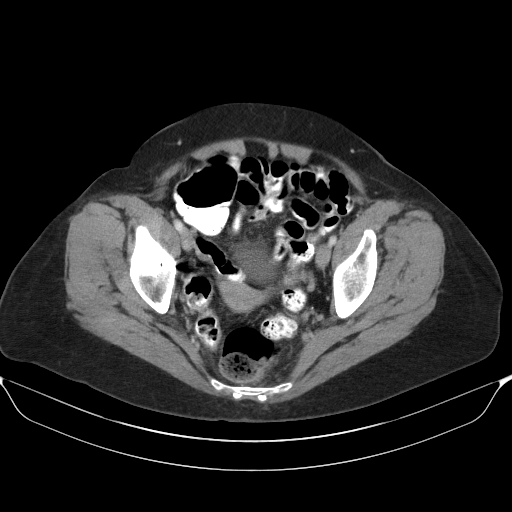
[im 32/82  soft-tissue]
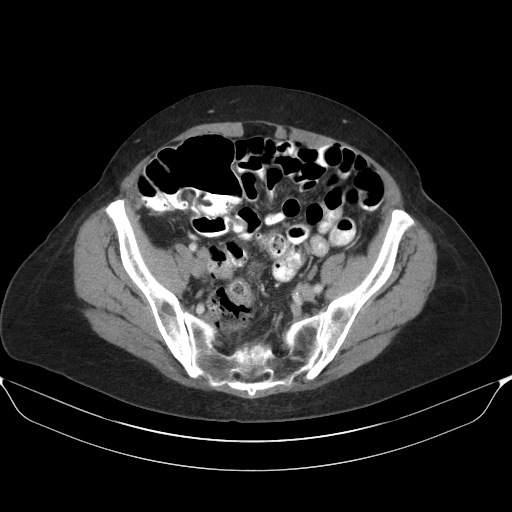
[im 38/82  soft-tissue]
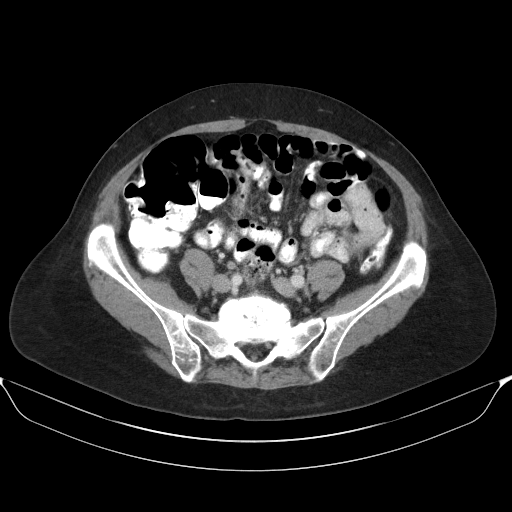
[im 44/82  soft-tissue]
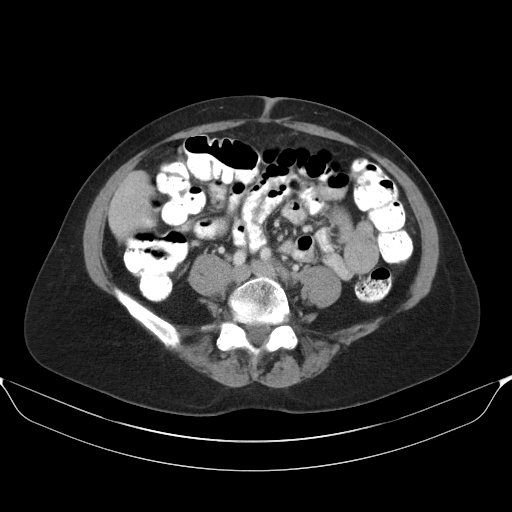
[im 50/82  soft-tissue]
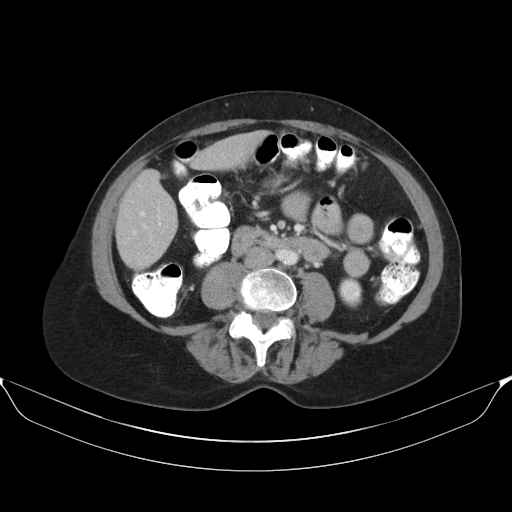
[im 57/82  soft-tissue]
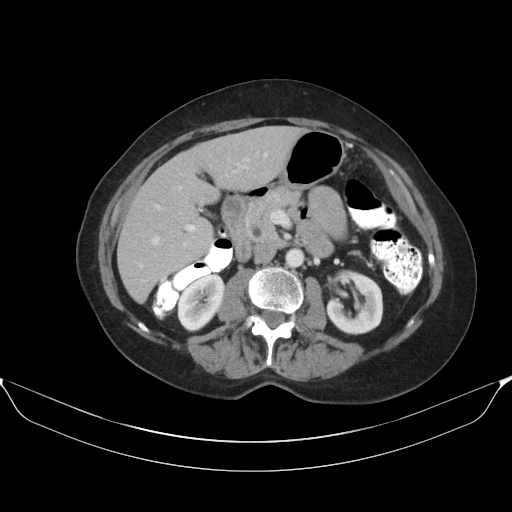
[im 57/82  lung]
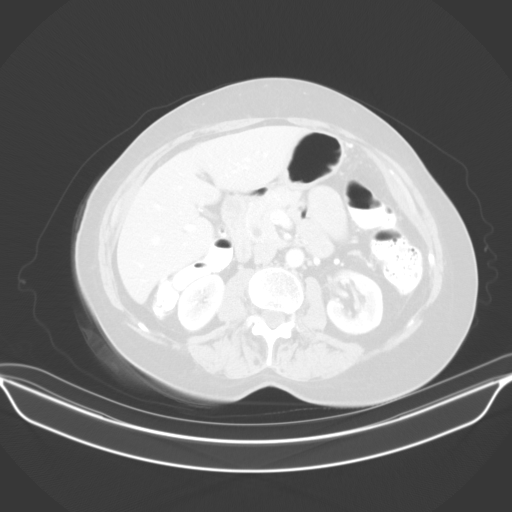
[im 57/82  bone]
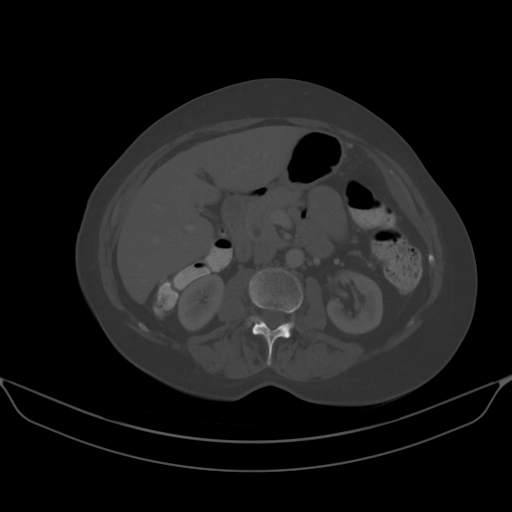
[im 63/82  soft-tissue]
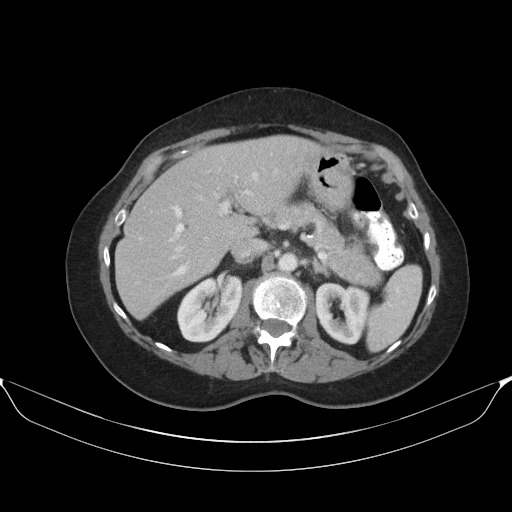
[im 63/82  lung]
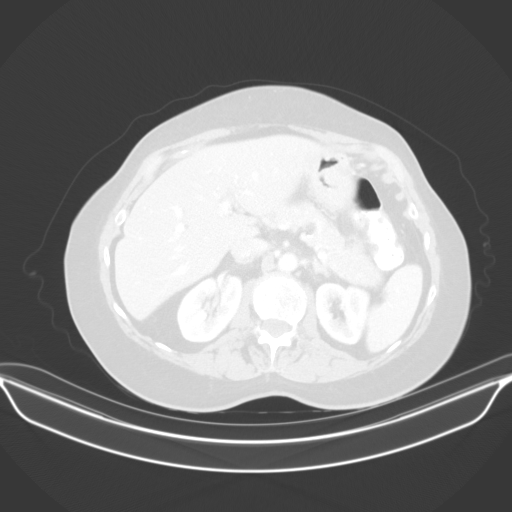
[im 69/82  soft-tissue]
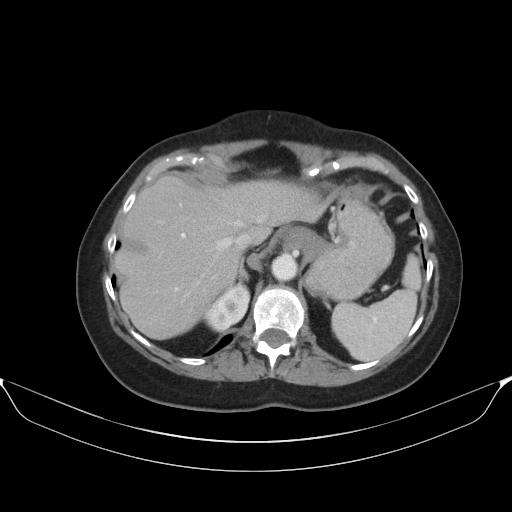
[im 69/82  lung]
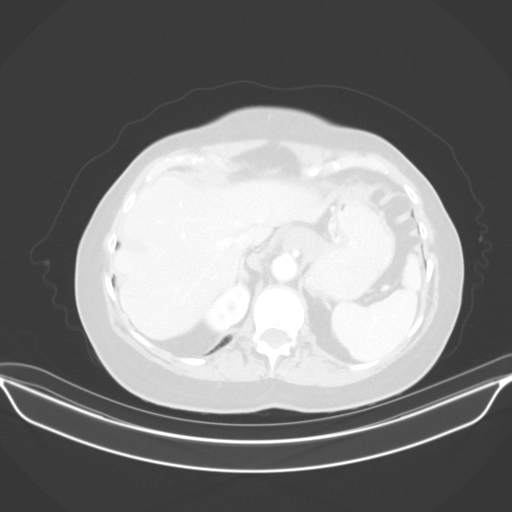
[im 75/82  soft-tissue]
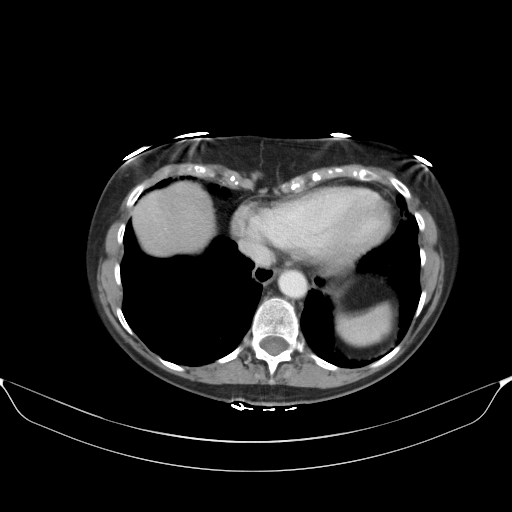
[im 75/82  lung]
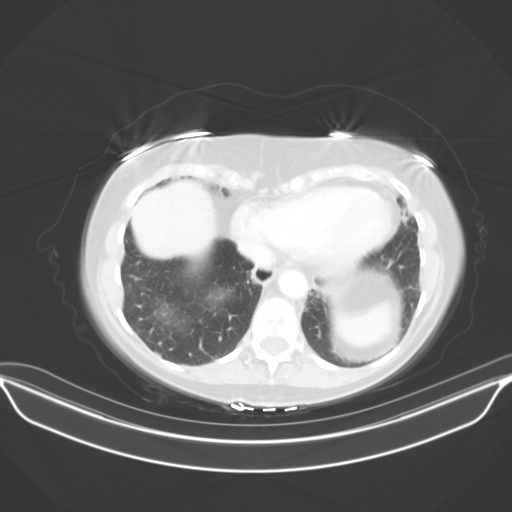

[12 of 32 positions shown; findings below may reference images not displayed]

RADIATION DOSE REDUCTION: This exam was performed according to the
departmental dose-optimization program which includes automated
exposure control, adjustment of the mA and/or kV according to
patient size and/or use of iterative reconstruction technique.

CONTRAST:  100mL 0BA203-H77 IOPAMIDOL (0BA203-H77) INJECTION 61%
FINDINGS: Lower chest: No acute abnormality.

Hepatobiliary: No focal liver abnormality is seen. Status post
cholecystectomy. No biliary dilatation.

Pancreas: Unremarkable. No pancreatic ductal dilatation or
surrounding inflammatory changes.

Spleen: Normal in size without focal abnormality.

Adrenals/Urinary Tract: Normal adrenal glands.

Kidneys normal size, orientation and position with symmetric
enhancement and excretion. Subcentimeter low-attenuation lesion
arises from the cortex of the right kidney upper pole consistent
with a cyst. No other renal masses. No stones. No hydronephrosis.
Normal ureters. Normal bladder.

Stomach/Bowel: Normal stomach. Small bowel normal in caliber with no
wall thickening or evidence of inflammation. Colon mildly distended,
most evident on the right. No colonic wall thickening or
inflammation. Normal appendix visualized.

Vascular/Lymphatic: No significant vascular findings are present. No
enlarged abdominal or pelvic lymph nodes.

Reproductive: Uterus and bilateral adnexa are unremarkable.

Other: No abdominal wall hernia or abnormality. No abdominopelvic
ascites.

Musculoskeletal: Well-positioned right total hip arthroplasty. No
fracture or acute finding. No bone lesion. Degenerative changes of
the lumbar spine and mild levoscoliosis.
IMPRESSION: 1. No acute findings. No findings to account for the patient's pain.
2. Mild colonic distension, nonspecific and less prominent than on
the prior CT, without evidence of bowel obstruction or inflammation.

## 2022-05-18 DIAGNOSIS — H43813 Vitreous degeneration, bilateral: Secondary | ICD-10-CM | POA: Diagnosis not present

## 2022-05-18 DIAGNOSIS — H26491 Other secondary cataract, right eye: Secondary | ICD-10-CM | POA: Diagnosis not present

## 2022-05-18 DIAGNOSIS — H0102B Squamous blepharitis left eye, upper and lower eyelids: Secondary | ICD-10-CM | POA: Diagnosis not present

## 2022-05-18 DIAGNOSIS — H401131 Primary open-angle glaucoma, bilateral, mild stage: Secondary | ICD-10-CM | POA: Diagnosis not present

## 2022-05-18 DIAGNOSIS — Z961 Presence of intraocular lens: Secondary | ICD-10-CM | POA: Diagnosis not present

## 2022-05-18 DIAGNOSIS — H0102A Squamous blepharitis right eye, upper and lower eyelids: Secondary | ICD-10-CM | POA: Diagnosis not present

## 2022-06-23 DIAGNOSIS — H26491 Other secondary cataract, right eye: Secondary | ICD-10-CM | POA: Diagnosis not present

## 2022-06-29 NOTE — Progress Notes (Unsigned)
Cardiology Office Note   Date:  07/01/2022   ID:  Carolyn Berger, DOB [birth year], MRN [id number]  PCP:  Crist Infante, MD  Cardiologist:   Minus Breeding, MD    Chief Complaint  Patient presents with   Hypotension      History of Present Illness: Carolyn Berger is a 77 y.o. female who presents for follow up of MVP.   She had mild MR and mild AI on last echo in Jan 2023.  Since I last saw her she has done well.  She is lost weight through dieting.  She is back to exercise including her martial arts. The patient denies any new symptoms such as chest discomfort, neck or arm discomfort. There has been no new shortness of breath, PND or orthopnea. There have been no reported palpitations, presyncope or syncope.    Past Medical History:  Diagnosis Date   Allergy    seasonal allergies, presently upper respiratory symptoms"runny nose, post nasal drip, rare cough_tx. Zpack   Anemia    hx of blood transfusion 1972 after childbirth hemarrahe no reaction    Arthritis    oa   Cataract    GERD (gastroesophageal reflux disease)    controls with diet and rare OTC meds   Heart murmur    mvp insufficiency   Hematoma 20 yrs ago   after breast biopsy   Hypertension    Mitral valve prolapse    with moderate mitral regurgitation-LOV Dr. Percival Spanish 10-22-13    Past Surgical History:  Procedure Laterality Date   ANTERIOR CRUCIATE LIGAMENT REPAIR Left 20 yrs ago   BREAST BIOPSY Bilateral last 20 yrs ago   of benign lesions right x 2 left x 1   CHOLECYSTECTOMY N/A 08/02/2014   Procedure: LAPAROSCOPIC CHOLECYSTECTOMY WITH INTRAOPERATIVE CHOLANGIOGRAM;  Surgeon: Pedro Earls, MD;  Location: [facility];  Service: General;  Laterality: N/A;   EYE SURGERY Bilateral    2016 ioc with lens implants   left wrist contracture surgery  2018   done under local in office   minicus Right 20 yrs ago   meniscus repair right knee   SPINE SURGERY  08/13/2016   fused C4-5neck mobility issues turning  side to side since   TEE WITHOUT CARDIOVERSION  07/23/2011   Procedure: TRANSESOPHAGEAL ECHOCARDIOGRAM (TEE);  Surgeon: Loralie Champagne, MD;  Location: [facility];  Service: Cardiovascular;  Laterality: N/A;   TOTAL HIP ARTHROPLASTY Right 01/24/2018   Procedure: RIGHT TOTAL HIP ARTHROPLASTY ANTERIOR APPROACH;  Surgeon: Paralee Cancel, MD;  Location: [facility];  Service: Orthopedics;  Laterality: Right;  70 mins   TUBAL LIGATION  1980 or 1981     Current Outpatient Medications  Medication Sig Dispense Refill   atorvastatin (LIPITOR) 80 MG tablet Take 80 mg by mouth daily.     fluticasone (FLONASE) 50 MCG/ACT nasal spray Place 2 sprays into both nostrils daily.     irbesartan (AVAPRO) 75 MG tablet Take 1 tablet (75 mg total) by mouth daily. 90 tablet 3   raloxifene (EVISTA) 60 MG tablet Take 60 mg by mouth every morning.      VAGIFEM 10 MCG TABS vaginal tablet Place 10 mcg vaginally once a week.   4   esomeprazole (NEXIUM) 20 MG capsule Take 20 mg by mouth daily.     NEOMYCIN-POLYMYXIN-HYDROCORTISONE (CORTISPORIN) 1 % SOLN OTIC solution Place 4 drops into the right ear in the morning, at noon, in the evening, and at bedtime.  omeprazole (PRILOSEC OTC) 20 MG tablet Take 20 mg by mouth daily.     No current facility-administered medications for this visit.    Allergies:   Typhoid vaccines, Lobster [shellfish allergy], and Celebrex [celecoxib]    ROS:  Please see the history of present illness.   Otherwise, review of systems are positive for none.   All other systems are reviewed and negative.    PHYSICAL EXAM: VS:  BP 102/68 (BP Location: Left Arm, Patient Position: Sitting, Cuff Size: Normal)   Pulse 68   Ht '5\' 5"'$  (1.651 m)   Wt 115 lb 6.4 oz (52.3 kg)   SpO2 99%   BMI 19.20 kg/m  , BMI Body mass index is 19.2 kg/m. GENERAL:  Well appearing NECK:  No jugular venous distention, waveform within normal limits, carotid upstroke brisk and symmetric, no bruits, no thyromegaly LUNGS:   Clear to auscultation bilaterally CHEST:  Unremarkable HEART:  PMI not displaced or sustained,S1 and S2 within normal limits, no S3, no S4, no clicks, no rubs, 2 out of 6 apical systolic murmur radiating at the aortic outflow tract and early peaking, 2 out of 6 late systolic murmur heard only in the left lateral position, no diastolic murmurs ABD:  Flat, positive bowel sounds normal in frequency in pitch, no bruits, no rebound, no guarding, no midline pulsatile mass, no hepatomegaly, no splenomegaly EXT:  2 plus pulses throughout, no edema, no cyanosis no clubbing   EKG:  EKG is  ordered today. The ekg ordered today demonstrates normal sinus rhythm, rate 68, axis within normal limits, intervals within normal limits, poor anterior R wave progression low voltage throughout.  No change from previous.  No change from previous.  Premature ectopic complexes.   Recent Labs: No results found for requested labs within last 365 days.    Lipid Panel    Component Value Date/Time   CHOL 248 (H) 10/02/2012 1214   TRIG 149 10/02/2012 1214   HDL 65 10/02/2012 1214   CHOLHDL 3.8 10/02/2012 1214   VLDL 30 10/02/2012 1214   LDLCALC 153 (H) 10/02/2012 1214      Wt Readings from Last 3 Encounters:  07/01/22 115 lb 6.4 oz (52.3 kg)  06/09/21 151 lb 3.2 oz (68.6 kg)  06/09/20 136 lb 6.4 oz (61.9 kg)      Other studies Reviewed: Additional studies/ records that were reviewed today include: Labs Review of the above records demonstrates: See elsewhere   ASSESSMENT AND PLAN:  MITRAL REGURGITATION:     This has been mild.  This was mild last year.  No change in therapy.  I will do an echo in January of next year.   AORTIC INSUFFICIENCY:  This was mild last year.  There was mild thickening of the aortic valve.  I will follow this clinically and with repeat echo next year.   HTN:   The blood pressure is low and she has been that she can come off of her medications.  I suggest going to 75 of the Avapro  first and if in the next couple of months her blood pressure still in the 100 - 110 range he could potentially back off completely.   current medicines are reviewed at length with the patient today.  The patient does not have concerns regarding medicines.  The following changes have been made: As above  Labs/ tests ordered today include:    Orders Placed This Encounter  Procedures   EKG 12-Lead   ECHOCARDIOGRAM COMPLETE  Disposition:   FU with me in 12 months.     Signed, Minus Breeding, MD  07/01/2022 10:14 AM    [place]

## 2022-07-01 ENCOUNTER — Encounter: Payer: Self-pay | Admitting: Cardiology

## 2022-07-01 ENCOUNTER — Ambulatory Visit: Payer: Medicare Other | Attending: Cardiology | Admitting: Cardiology

## 2022-07-01 VITALS — BP 102/68 | HR 68 | Ht 65.0 in | Wt 115.4 lb

## 2022-07-01 DIAGNOSIS — I080 Rheumatic disorders of both mitral and aortic valves: Secondary | ICD-10-CM | POA: Diagnosis not present

## 2022-07-01 DIAGNOSIS — I351 Nonrheumatic aortic (valve) insufficiency: Secondary | ICD-10-CM

## 2022-07-01 DIAGNOSIS — I10 Essential (primary) hypertension: Secondary | ICD-10-CM | POA: Diagnosis not present

## 2022-07-01 DIAGNOSIS — Z1231 Encounter for screening mammogram for malignant neoplasm of breast: Secondary | ICD-10-CM | POA: Diagnosis not present

## 2022-07-01 MED ORDER — IRBESARTAN 75 MG PO TABS: 75.0000 mg | ORAL_TABLET | Freq: Every day | ORAL | 3 refills | Status: DC

## 2022-07-01 NOTE — Patient Instructions (Addendum)
Medication Instructions:  Decrease Irbesartan 75 mg daily   *If you need a refill on your cardiac medications before your next appointment, please call your pharmacy*   Lab Work: NONE ordered at this time of appointment   If you have labs (blood work) drawn today and your tests are completely normal, you will receive your results only by: [facility] (if you have MyChart) OR A paper copy in the mail If you have any lab test that is abnormal or we need to change your treatment, we will call you to review the results.   Testing/Procedures: Your physician has requested that you have an echocardiogram in 1 year (06/2023). Echocardiography is a painless test that uses sound waves to create images of your heart. It provides your doctor with information about the size and shape of your heart and how well your heart's chambers and valves are working. This procedure takes approximately one hour. There are no restrictions for this procedure. Please do NOT wear cologne, perfume, aftershave, or lotions (deodorant is allowed). Please arrive 15 minutes prior to your appointment time.    Follow-Up: At [facility], you and your health needs are our priority.  As part of our continuing mission to provide you with exceptional heart care, we have created designated Provider Care Teams.  These Care Teams include your primary Cardiologist (physician) and Advanced Practice Providers (APPs -  Physician Assistants and Nurse Practitioners) who all work together to provide you with the care you need, when you need it.  We recommend signing up for the patient portal called "MyChart".  Sign up information is provided on this After Visit Summary.  MyChart is used to connect with patients for Virtual Visits (Telemedicine).  Patients are able to view lab/test results, encounter notes, upcoming appointments, etc.  Non-urgent messages can be sent to your provider as well.   To learn more about what you can do  with MyChart, go to [email or web address].    Your next appointment:   1 year(s)  Provider:   Minus Breeding, MD     Other Instructions

## 2022-07-07 DIAGNOSIS — R922 Inconclusive mammogram: Secondary | ICD-10-CM | POA: Diagnosis not present

## 2022-07-07 DIAGNOSIS — R928 Other abnormal and inconclusive findings on diagnostic imaging of breast: Secondary | ICD-10-CM | POA: Diagnosis not present

## 2022-07-07 DIAGNOSIS — N6489 Other specified disorders of breast: Secondary | ICD-10-CM | POA: Diagnosis not present

## 2022-07-07 DIAGNOSIS — N644 Mastodynia: Secondary | ICD-10-CM | POA: Diagnosis not present

## 2022-10-05 DIAGNOSIS — R7989 Other specified abnormal findings of blood chemistry: Secondary | ICD-10-CM | POA: Diagnosis not present

## 2022-10-05 DIAGNOSIS — E785 Hyperlipidemia, unspecified: Secondary | ICD-10-CM | POA: Diagnosis not present

## 2022-10-05 DIAGNOSIS — Z1212 Encounter for screening for malignant neoplasm of rectum: Secondary | ICD-10-CM | POA: Diagnosis not present

## 2022-10-05 DIAGNOSIS — K219 Gastro-esophageal reflux disease without esophagitis: Secondary | ICD-10-CM | POA: Diagnosis not present

## 2022-10-05 DIAGNOSIS — E559 Vitamin D deficiency, unspecified: Secondary | ICD-10-CM | POA: Diagnosis not present

## 2022-10-05 DIAGNOSIS — I10 Essential (primary) hypertension: Secondary | ICD-10-CM | POA: Diagnosis not present

## 2022-10-05 DIAGNOSIS — R7301 Impaired fasting glucose: Secondary | ICD-10-CM | POA: Diagnosis not present

## 2022-10-12 DIAGNOSIS — Z0000 Encounter for general adult medical examination without abnormal findings: Secondary | ICD-10-CM | POA: Diagnosis not present

## 2022-10-12 DIAGNOSIS — M1611 Unilateral primary osteoarthritis, right hip: Secondary | ICD-10-CM | POA: Diagnosis not present

## 2022-10-12 DIAGNOSIS — M8580 Other specified disorders of bone density and structure, unspecified site: Secondary | ICD-10-CM | POA: Diagnosis not present

## 2022-10-12 DIAGNOSIS — I10 Essential (primary) hypertension: Secondary | ICD-10-CM | POA: Diagnosis not present

## 2022-10-12 DIAGNOSIS — K5900 Constipation, unspecified: Secondary | ICD-10-CM | POA: Diagnosis not present

## 2022-10-12 DIAGNOSIS — E785 Hyperlipidemia, unspecified: Secondary | ICD-10-CM | POA: Diagnosis not present

## 2022-10-12 DIAGNOSIS — R011 Cardiac murmur, unspecified: Secondary | ICD-10-CM | POA: Diagnosis not present

## 2022-10-12 DIAGNOSIS — L409 Psoriasis, unspecified: Secondary | ICD-10-CM | POA: Diagnosis not present

## 2022-10-12 DIAGNOSIS — R7301 Impaired fasting glucose: Secondary | ICD-10-CM | POA: Diagnosis not present

## 2022-10-12 DIAGNOSIS — R82998 Other abnormal findings in urine: Secondary | ICD-10-CM | POA: Diagnosis not present

## 2022-10-27 DIAGNOSIS — H16142 Punctate keratitis, left eye: Secondary | ICD-10-CM | POA: Diagnosis not present

## 2022-11-03 DIAGNOSIS — H16142 Punctate keratitis, left eye: Secondary | ICD-10-CM | POA: Diagnosis not present

## 2022-11-03 DIAGNOSIS — H0102B Squamous blepharitis left eye, upper and lower eyelids: Secondary | ICD-10-CM | POA: Diagnosis not present

## 2022-11-03 DIAGNOSIS — H401131 Primary open-angle glaucoma, bilateral, mild stage: Secondary | ICD-10-CM | POA: Diagnosis not present

## 2022-11-03 DIAGNOSIS — Z961 Presence of intraocular lens: Secondary | ICD-10-CM | POA: Diagnosis not present

## 2022-11-03 DIAGNOSIS — H43813 Vitreous degeneration, bilateral: Secondary | ICD-10-CM | POA: Diagnosis not present

## 2022-11-03 DIAGNOSIS — H0102A Squamous blepharitis right eye, upper and lower eyelids: Secondary | ICD-10-CM | POA: Diagnosis not present

## 2022-12-08 DIAGNOSIS — H0102B Squamous blepharitis left eye, upper and lower eyelids: Secondary | ICD-10-CM | POA: Diagnosis not present

## 2022-12-08 DIAGNOSIS — H16142 Punctate keratitis, left eye: Secondary | ICD-10-CM | POA: Diagnosis not present

## 2022-12-08 DIAGNOSIS — H43813 Vitreous degeneration, bilateral: Secondary | ICD-10-CM | POA: Diagnosis not present

## 2022-12-08 DIAGNOSIS — H0102A Squamous blepharitis right eye, upper and lower eyelids: Secondary | ICD-10-CM | POA: Diagnosis not present

## 2022-12-08 DIAGNOSIS — H401131 Primary open-angle glaucoma, bilateral, mild stage: Secondary | ICD-10-CM | POA: Diagnosis not present

## 2022-12-08 DIAGNOSIS — Z961 Presence of intraocular lens: Secondary | ICD-10-CM | POA: Diagnosis not present

## 2022-12-14 DIAGNOSIS — L218 Other seborrheic dermatitis: Secondary | ICD-10-CM | POA: Diagnosis not present

## 2023-02-11 DIAGNOSIS — M25561 Pain in right knee: Secondary | ICD-10-CM | POA: Insufficient documentation

## 2023-02-21 DIAGNOSIS — L218 Other seborrheic dermatitis: Secondary | ICD-10-CM | POA: Diagnosis not present

## 2023-03-07 DIAGNOSIS — M25561 Pain in right knee: Secondary | ICD-10-CM | POA: Diagnosis not present

## 2023-03-15 DIAGNOSIS — Z23 Encounter for immunization: Secondary | ICD-10-CM | POA: Diagnosis not present

## 2023-03-15 DIAGNOSIS — M25561 Pain in right knee: Secondary | ICD-10-CM | POA: Diagnosis not present

## 2023-04-18 DIAGNOSIS — H0102B Squamous blepharitis left eye, upper and lower eyelids: Secondary | ICD-10-CM | POA: Diagnosis not present

## 2023-04-18 DIAGNOSIS — H43813 Vitreous degeneration, bilateral: Secondary | ICD-10-CM | POA: Diagnosis not present

## 2023-04-18 DIAGNOSIS — H0102A Squamous blepharitis right eye, upper and lower eyelids: Secondary | ICD-10-CM | POA: Diagnosis not present

## 2023-04-18 DIAGNOSIS — H401131 Primary open-angle glaucoma, bilateral, mild stage: Secondary | ICD-10-CM | POA: Diagnosis not present

## 2023-04-18 DIAGNOSIS — H16142 Punctate keratitis, left eye: Secondary | ICD-10-CM | POA: Diagnosis not present

## 2023-04-18 DIAGNOSIS — Z961 Presence of intraocular lens: Secondary | ICD-10-CM | POA: Diagnosis not present

## 2023-04-25 DIAGNOSIS — Z23 Encounter for immunization: Secondary | ICD-10-CM | POA: Diagnosis not present

## 2023-05-12 DIAGNOSIS — L218 Other seborrheic dermatitis: Secondary | ICD-10-CM | POA: Diagnosis not present

## 2023-06-14 ENCOUNTER — Ambulatory Visit (HOSPITAL_COMMUNITY): Payer: Medicare Other | Attending: Cardiology

## 2023-06-14 DIAGNOSIS — I080 Rheumatic disorders of both mitral and aortic valves: Secondary | ICD-10-CM | POA: Insufficient documentation

## 2023-06-14 DIAGNOSIS — I351 Nonrheumatic aortic (valve) insufficiency: Secondary | ICD-10-CM | POA: Insufficient documentation

## 2023-06-14 LAB — ECHOCARDIOGRAM COMPLETE
Area-P 1/2: 4.01 cm2
P 1/2 time: 470 ms
S' Lateral: 2.5 cm

## 2023-06-30 DIAGNOSIS — I340 Nonrheumatic mitral (valve) insufficiency: Secondary | ICD-10-CM | POA: Insufficient documentation

## 2023-06-30 NOTE — Progress Notes (Signed)
  Cardiology Office Note:   Date:  07/01/2023  ID:  Carolyn Berger, DOB 1946-05-05, MRN [id number] PCP: Rodrigo Ran, MD  [facility] Providers Cardiologist:  Rollene Rotunda, MD {   History of Present Illness:   Carolyn Berger is a 78 y.o. female who presents for follow up of MVP.   She had moderate MR and moderate AI on last echo in Jan 2025.  She continues to do all her martial arts and exercises routinely. The patient denies any new symptoms such as chest discomfort, neck or arm discomfort. There has been no new shortness of breath, PND or orthopnea. There have been no reported palpitations, presyncope or syncope.    ROS: As stated in the HPI and negative for all other systems.  Studies Reviewed:    EKG:   EKG Interpretation Date/Time:  Friday July 01 2023 10:34:37 EST Ventricular Rate:  64 PR Interval:  174 QRS Duration:  76 QT Interval:  394 QTC Calculation: 406 R Axis:   -8  Text Interpretation: Normal sinus rhythm Possible Left atrial enlargement Poor anterior R wave progression When compared with ECG of 24-Jan-2018 14:32, No significant change since last tracing Confirmed by Rollene Rotunda ([id number]) on 07/01/2023 11:16:11 AM     Risk Assessment/Calculations:              Physical Exam:   VS:  BP 130/80   Pulse 67   Ht 5\' 4"  (1.626 m)   Wt 103 lb 9.6 oz (47 kg)   SpO2 98%   BMI 17.78 kg/m    Wt Readings from Last 3 Encounters:  07/01/23 103 lb 9.6 oz (47 kg)  07/01/22 115 lb 6.4 oz (52.3 kg)  06/09/21 151 lb 3.2 oz (68.6 kg)     GEN: Well nourished, well developed in no acute distress NECK: No JVD; No carotid bruits CARDIAC:  PMI not displaced or sustained,S1 and S2 within normal limits, no S3, no S4, no clicks, no rubs, 2 out of 6 apical systolic murmur radiating at the aortic outflow tract and early peaking, 2 out of 6 late systolic murmur heard only in the left lateral position, no diastolic murmurs  RESPIRATORY:  Clear to auscultation without  rales, wheezing or rhonchi  ABDOMEN: Soft, non-tender, non-distended EXTREMITIES:  No edema; No deformity   ASSESSMENT AND PLAN:   MITRAL REGURGITATION:    This was moderate.  I will follow this up again next year.   AORTIC INSUFFICIENCY: This was moderate as well.  I will follow this up next year.  She is having no symptoms related to this.  HTN:   The blood pressure is well-controlled.  No change in therapy.    She did have some low blood pressures last year but they seem reasonable now.  No change in therapy.       Follow up with me after the echo.   Signed, Rollene Rotunda, MD

## 2023-07-01 ENCOUNTER — Encounter: Payer: Self-pay | Admitting: Cardiology

## 2023-07-01 ENCOUNTER — Ambulatory Visit: Payer: Medicare Other | Attending: Cardiology | Admitting: Cardiology

## 2023-07-01 VITALS — BP 130/80 | HR 67 | Ht 64.0 in | Wt 103.6 lb

## 2023-07-01 DIAGNOSIS — I10 Essential (primary) hypertension: Secondary | ICD-10-CM | POA: Diagnosis not present

## 2023-07-01 DIAGNOSIS — I351 Nonrheumatic aortic (valve) insufficiency: Secondary | ICD-10-CM | POA: Insufficient documentation

## 2023-07-01 DIAGNOSIS — I340 Nonrheumatic mitral (valve) insufficiency: Secondary | ICD-10-CM | POA: Insufficient documentation

## 2023-07-01 NOTE — Patient Instructions (Signed)
Medication Instructions:  No changes. *If you need a refill on your cardiac medications before your next appointment, please call your pharmacy*   Testing/Procedures: Your physician has requested that you have an echocardiogram due in one year.  Echocardiography is a painless test that uses sound waves to create images of your heart. It provides your doctor with information about the size and shape of your heart and how well your heart's chambers and valves are working. This procedure takes approximately one hour. There are no restrictions for this procedure. Please do NOT wear cologne, perfume, aftershave, or lotions (deodorant is allowed). Please arrive 15 minutes prior to your appointment time.  Please note: We ask at that you not bring children with you during ultrasound (echo/ vascular) testing. Due to room size and safety concerns, children are not allowed in the ultrasound rooms during exams. Our front office staff cannot provide observation of children in our lobby area while testing is being conducted. An adult accompanying a patient to their appointment will only be allowed in the ultrasound room at the discretion of the ultrasound technician under special circumstances. We apologize for any inconvenience.    Follow-Up: At [facility], you and your health needs are our priority.  As part of our continuing mission to provide you with exceptional heart care, we have created designated Provider Care Teams.  These Care Teams include your primary Cardiologist (physician) and Advanced Practice Providers (APPs -  Physician Assistants and Nurse Practitioners) who all work together to provide you with the care you need, when you need it.  We recommend signing up for the patient portal called "MyChart".  Sign up information is provided on this After Visit Summary.  MyChart is used to connect with patients for Virtual Visits (Telemedicine).  Patients are able to view lab/test results,  encounter notes, upcoming appointments, etc.  Non-urgent messages can be sent to your provider as well.   To learn more about what you can do with MyChart, go to [email or web address].    Your next appointment:   13 month(s)  Provider:   Rollene Rotunda, MD

## 2023-07-05 DIAGNOSIS — Z1231 Encounter for screening mammogram for malignant neoplasm of breast: Secondary | ICD-10-CM | POA: Diagnosis not present

## 2023-07-19 ENCOUNTER — Telehealth: Payer: Self-pay | Admitting: Cardiology

## 2023-07-19 MED ORDER — IRBESARTAN 75 MG PO TABS: 75.0000 mg | ORAL_TABLET | Freq: Every day | ORAL | 3 refills | Status: DC

## 2023-07-19 NOTE — Telephone Encounter (Signed)
Pt's medication was sent to pt's pharmacy as requested. Confirmation received.

## 2023-07-19 NOTE — Telephone Encounter (Signed)
*  STAT* If patient is at the pharmacy, call can be transferred to refill team.   1. Which medications need to be refilled? (please list name of each medication and dose if known)   irbesartan (AVAPRO) 75 MG tablet    2. Which pharmacy/location (including street and city if local pharmacy) is medication to be sent to?  [id number] - [place], [place] - [address] AT [facility] OF [address]      3. Do they need a 30 day or 90 day supply? 90 day   Pt is out of medication

## 2023-09-05 DIAGNOSIS — H0102B Squamous blepharitis left eye, upper and lower eyelids: Secondary | ICD-10-CM | POA: Diagnosis not present

## 2023-09-05 DIAGNOSIS — H16142 Punctate keratitis, left eye: Secondary | ICD-10-CM | POA: Diagnosis not present

## 2023-09-05 DIAGNOSIS — H43813 Vitreous degeneration, bilateral: Secondary | ICD-10-CM | POA: Diagnosis not present

## 2023-09-05 DIAGNOSIS — H0102A Squamous blepharitis right eye, upper and lower eyelids: Secondary | ICD-10-CM | POA: Diagnosis not present

## 2023-09-05 DIAGNOSIS — Z961 Presence of intraocular lens: Secondary | ICD-10-CM | POA: Diagnosis not present

## 2023-09-05 DIAGNOSIS — H401131 Primary open-angle glaucoma, bilateral, mild stage: Secondary | ICD-10-CM | POA: Diagnosis not present

## 2023-11-07 DIAGNOSIS — H903 Sensorineural hearing loss, bilateral: Secondary | ICD-10-CM | POA: Diagnosis not present

## 2023-11-21 DIAGNOSIS — L218 Other seborrheic dermatitis: Secondary | ICD-10-CM | POA: Diagnosis not present

## 2023-12-28 DIAGNOSIS — R7301 Impaired fasting glucose: Secondary | ICD-10-CM | POA: Diagnosis not present

## 2023-12-28 DIAGNOSIS — E559 Vitamin D deficiency, unspecified: Secondary | ICD-10-CM | POA: Diagnosis not present

## 2023-12-28 DIAGNOSIS — Z1212 Encounter for screening for malignant neoplasm of rectum: Secondary | ICD-10-CM | POA: Diagnosis not present

## 2023-12-28 DIAGNOSIS — I10 Essential (primary) hypertension: Secondary | ICD-10-CM | POA: Diagnosis not present

## 2023-12-28 DIAGNOSIS — E785 Hyperlipidemia, unspecified: Secondary | ICD-10-CM | POA: Diagnosis not present

## 2023-12-29 ENCOUNTER — Other Ambulatory Visit: Payer: Self-pay | Admitting: Medical Genetics

## 2023-12-29 DIAGNOSIS — Z006 Encounter for examination for normal comparison and control in clinical research program: Secondary | ICD-10-CM

## 2024-01-02 DIAGNOSIS — H401131 Primary open-angle glaucoma, bilateral, mild stage: Secondary | ICD-10-CM | POA: Diagnosis not present

## 2024-01-02 DIAGNOSIS — H43813 Vitreous degeneration, bilateral: Secondary | ICD-10-CM | POA: Diagnosis not present

## 2024-01-02 DIAGNOSIS — H0102B Squamous blepharitis left eye, upper and lower eyelids: Secondary | ICD-10-CM | POA: Diagnosis not present

## 2024-01-02 DIAGNOSIS — H16142 Punctate keratitis, left eye: Secondary | ICD-10-CM | POA: Diagnosis not present

## 2024-01-02 DIAGNOSIS — H0102A Squamous blepharitis right eye, upper and lower eyelids: Secondary | ICD-10-CM | POA: Diagnosis not present

## 2024-01-02 DIAGNOSIS — Z961 Presence of intraocular lens: Secondary | ICD-10-CM | POA: Diagnosis not present

## 2024-01-05 ENCOUNTER — Other Ambulatory Visit

## 2024-01-05 DIAGNOSIS — Z006 Encounter for examination for normal comparison and control in clinical research program: Secondary | ICD-10-CM

## 2024-01-10 DIAGNOSIS — I10 Essential (primary) hypertension: Secondary | ICD-10-CM | POA: Diagnosis not present

## 2024-01-10 DIAGNOSIS — D689 Coagulation defect, unspecified: Secondary | ICD-10-CM | POA: Diagnosis not present

## 2024-01-10 DIAGNOSIS — Z0000 Encounter for general adult medical examination without abnormal findings: Secondary | ICD-10-CM | POA: Diagnosis not present

## 2024-01-10 DIAGNOSIS — I351 Nonrheumatic aortic (valve) insufficiency: Secondary | ICD-10-CM | POA: Diagnosis not present

## 2024-01-10 DIAGNOSIS — R82998 Other abnormal findings in urine: Secondary | ICD-10-CM | POA: Diagnosis not present

## 2024-01-10 DIAGNOSIS — M8589 Other specified disorders of bone density and structure, multiple sites: Secondary | ICD-10-CM | POA: Diagnosis not present

## 2024-01-10 DIAGNOSIS — L409 Psoriasis, unspecified: Secondary | ICD-10-CM | POA: Diagnosis not present

## 2024-01-10 DIAGNOSIS — I340 Nonrheumatic mitral (valve) insufficiency: Secondary | ICD-10-CM | POA: Diagnosis not present

## 2024-01-10 DIAGNOSIS — R7301 Impaired fasting glucose: Secondary | ICD-10-CM | POA: Diagnosis not present

## 2024-01-10 DIAGNOSIS — G3184 Mild cognitive impairment, so stated: Secondary | ICD-10-CM | POA: Diagnosis not present

## 2024-01-10 DIAGNOSIS — M8580 Other specified disorders of bone density and structure, unspecified site: Secondary | ICD-10-CM | POA: Diagnosis not present

## 2024-01-10 DIAGNOSIS — M1611 Unilateral primary osteoarthritis, right hip: Secondary | ICD-10-CM | POA: Diagnosis not present

## 2024-01-10 DIAGNOSIS — J309 Allergic rhinitis, unspecified: Secondary | ICD-10-CM | POA: Diagnosis not present

## 2024-01-10 DIAGNOSIS — E785 Hyperlipidemia, unspecified: Secondary | ICD-10-CM | POA: Diagnosis not present

## 2024-01-20 LAB — GENECONNECT MOLECULAR SCREEN: Genetic Analysis Overall Interpretation: NEGATIVE

## 2024-02-01 DIAGNOSIS — Z1211 Encounter for screening for malignant neoplasm of colon: Secondary | ICD-10-CM | POA: Diagnosis not present

## 2024-02-13 DIAGNOSIS — Z23 Encounter for immunization: Secondary | ICD-10-CM | POA: Diagnosis not present

## 2024-02-13 DIAGNOSIS — G3184 Mild cognitive impairment, so stated: Secondary | ICD-10-CM | POA: Diagnosis not present

## 2024-02-17 ENCOUNTER — Other Ambulatory Visit: Payer: Self-pay | Admitting: Internal Medicine

## 2024-02-17 DIAGNOSIS — R413 Other amnesia: Secondary | ICD-10-CM

## 2024-02-18 ENCOUNTER — Ambulatory Visit
Admission: RE | Admit: 2024-02-18 | Discharge: 2024-02-18 | Disposition: A | Discharge status: Home / Self Care | Source: Ambulatory Visit | Attending: Internal Medicine | Admitting: Internal Medicine

## 2024-02-18 DIAGNOSIS — R413 Other amnesia: Secondary | ICD-10-CM

## 2024-02-22 ENCOUNTER — Other Ambulatory Visit: Payer: Self-pay | Admitting: Internal Medicine

## 2024-02-22 DIAGNOSIS — R413 Other amnesia: Secondary | ICD-10-CM

## 2024-02-23 ENCOUNTER — Ambulatory Visit (INDEPENDENT_AMBULATORY_CARE_PROVIDER_SITE_OTHER): Admitting: Neurology

## 2024-02-23 ENCOUNTER — Encounter: Payer: Self-pay | Admitting: Neurology

## 2024-02-23 VITALS — BP 146/72 | HR 80 | Ht 61.0 in | Wt 106.0 lb

## 2024-02-23 DIAGNOSIS — R413 Other amnesia: Secondary | ICD-10-CM

## 2024-02-23 NOTE — Progress Notes (Signed)
 Chief Complaint  Patient presents with   New Patient (Initial Visit)    RM 15, Pt w/husband, referred by PCP for MCI, elevated pTau.      ASSESSMENT AND PLAN  Carolyn Berger is a 78 y.o. 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   MEDICAL HISTORY:  Carolyn Berger is a 78 year old female accompanied by her husband, seen in request by her primary care from [facility] Dr. Shayne, Oneil, for evaluation of memory loss, she is accompanied by her husband at today's visit February 23, 2024    History is obtained from the patient and review of electronic medical records. I personally reviewed pertinent available imaging films in PACS.   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, since 2024, she noticed mild difficulty, take her much longer to complete a task, if she is distracted, it is harder for her to get back to task, she could not remember where she left of  She remain physically active, practicing martial arts, also taking [place] online class, eat healthy diet, with intentional weight loss over the past few years, independent of living, is not good at the direction while driving, has been like that all her life, MoCA examination 24/30 today, missed 3 out of 5 recall,   She has no family history of dementia  Laboratory evaluation July 2025: Normal CMP creatinine 0.9  CBC, hemoglobin of 13.8, LDL of 74, A1c 5.7 normal TSH, vitamin D  65.8, apolipoprotein B 74,  Sept 11 2025,  PTau217 was mildly elevated 0.23 (0-0.18),   Personally reviewed MRI of the brain without contrast from February 18, 2024 that was normal  PHYSICAL EXAM:   Vitals:   02/23/24 1516  BP: (!) 146/72  Pulse: 80  Weight: 106 lb (48.1 kg)  Height: 5' 1 (1.549 m)   Body mass index is 20.03 kg/m.  PHYSICAL EXAMNIATION:  Gen: NAD, conversant, well nourised, well groomed                     Cardiovascular: Regular rate rhythm, no peripheral edema, warm, nontender. Eyes: Conjunctivae clear without exudates or hemorrhage Neck: Supple, no carotid bruits. Pulmonary: Clear to auscultation bilaterally   NEUROLOGICAL EXAM:  MENTAL STATUS: Speech/cognition: Awake, alert, oriented to history taking and casual conversation    02/23/2024    3:00 PM  Montreal Cognitive Assessment   Visuospatial/ Executive (0/5) 5  Naming (0/3) 3  Attention: Read list of digits (0/2) 2  Attention: Read list of letters (0/1) 1  Attention: Serial 7 subtraction starting at 100 (0/3) 1  Language: Repeat phrase (0/2) 2  Language : Fluency (0/1) 0  Abstraction (0/2) 2  Delayed Recall (0/5) 2  Orientation (0/6) 6  Total 24    CRANIAL NERVES: CN II: Visual fields are full to confrontation. Pupils are round equal and briskly reactive to light. CN III, IV, VI: extraocular movement are normal. No ptosis. CN V: Facial sensation  is intact to light touch CN VII: Face is symmetric with normal eye closure  CN VIII: Hearing is normal to causal conversation. CN IX, X: Phonation is normal. CN XI: Head turning and shoulder shrug are intact  MOTOR: There is no pronator drift of out-stretched arms. Muscle bulk and tone are normal. Muscle strength is normal.  REFLEXES: Reflexes are 2+ and symmetric at the biceps, triceps, knees, and ankles. Plantar responses are flexor.  SENSORY: Intact to light touch,  pinprick and vibratory sensation are intact in fingers and toes.  COORDINATION: There is no trunk or limb dysmetria noted.  GAIT/STANCE: Posture is normal. Gait is steady   REVIEW OF SYSTEMS:  Full 14 system review of systems performed and notable only for as above All other review of systems were negative.   ALLERGIES: Allergies  Allergen Reactions   Typhoid Vaccines Rash   Lobster [Shellfish Allergy] Nausea And Vomiting    Lobster only   Celebrex  [Celecoxib ] Hives    HOME MEDICATIONS: Current Outpatient Medications  Medication Sig Dispense Refill   atorvastatin  (LIPITOR ) 80 MG tablet Take 80 mg by mouth daily.     fluticasone  (FLONASE ) 50 MCG/ACT nasal spray Place 2 sprays into both nostrils daily.     irbesartan  (AVAPRO ) 75 MG tablet Take 1 tablet (75 mg total) by mouth daily. 90 tablet 3   LUMIGAN 0.01 % SOLN Place 1 drop into both eyes at bedtime.     raloxifene  (EVISTA ) 60 MG tablet Take 60 mg by mouth every morning.      VAGIFEM 10 MCG TABS vaginal tablet Place 10 mcg vaginally once a week.   4   No current facility-administered medications for this visit.    PAST MEDICAL HISTORY: Past Medical History:  Diagnosis Date   Allergy    seasonal allergies, presently upper respiratory symptomsrunny nose, post nasal drip, rare cough_tx. Zpack   Anemia    hx of blood transfusion 1972 after childbirth hemarrahe no reaction    Arthritis    oa   Cataract    GERD (gastroesophageal reflux disease)    controls with diet and rare OTC meds   Heart murmur    mvp insufficiency   Hematoma 20 yrs ago   after breast biopsy   Hypertension    Mitral valve prolapse    with moderate mitral regurgitation-LOV Dr. Lavona 10-22-13    PAST SURGICAL HISTORY: Past Surgical History:  Procedure Laterality Date   ANTERIOR CRUCIATE LIGAMENT REPAIR Left 20 yrs ago   BREAST BIOPSY Bilateral last 20 yrs ago   of benign lesions right x 2 left x 1   CHOLECYSTECTOMY N/A 08/02/2014    Procedure: LAPAROSCOPIC CHOLECYSTECTOMY WITH INTRAOPERATIVE CHOLANGIOGRAM;  Surgeon: Donnice KATHEE Lunger, MD;  Location: [facility];  Service: General;  Laterality: N/A;   EYE SURGERY Bilateral    2016 ioc with lens implants   left wrist contracture surgery  2018   done under local in office   minicus Right 20 yrs ago   meniscus repair right knee   SPINE SURGERY  08/13/2016   fused C4-5neck mobility issues turning side to side since   TEE WITHOUT CARDIOVERSION  07/23/2011   Procedure: TRANSESOPHAGEAL ECHOCARDIOGRAM (TEE);  Surgeon: Ezra Shuck, MD;  Location: [facility] ENDOSCOPY;  Service: Cardiovascular;  Laterality: N/A;   TOTAL HIP ARTHROPLASTY Right 01/24/2018   Procedure: RIGHT TOTAL HIP ARTHROPLASTY ANTERIOR APPROACH;  Surgeon: Ernie Donnice, MD;  Location: [facility];  Service: Orthopedics;  Laterality: Right;  70 mins  TUBAL LIGATION  1980 or 1981    FAMILY HISTORY: Family History  Problem Relation Age of Onset   Cancer Mother        breast   Cancer Father        lymphoma   Cancer Sister        uterine   Cancer Sister        Breast CA    SOCIAL HISTORY: Social History   Socioeconomic History   Marital status: Married    Spouse name: Not on file   Number of children: Not on file   Years of education: Not on file   Highest education level: Not on file  Occupational History   Not on file  Tobacco Use   Smoking status: Never   Smokeless tobacco: Never  Vaping Use   Vaping status: Never Used  Substance and Sexual Activity   Alcohol  use: Yes    Alcohol /week: 7.0 standard drinks of alcohol     Types: 7 Glasses of wine per week    Comment: 1-2 glasses of wine per day   Drug use: Never   Sexual activity: Yes  Other Topics Concern   Not on file  Social History Narrative   Exercise karate daily for > 1 hour   Social Drivers of Corporate investment banker Strain: Not on file  Food Insecurity: Not on file  Transportation Needs: Not on file  Physical Activity: Not on file   Stress: Not on file  Social Connections: Not on file  Intimate Partner Violence: Not on file      Modena Callander, M.D. Ph.D.  [facility] [address] [place], [place] [address] Ph: [phone] Fax: [phone]  CC:  Shayne Anes, MD [address] [place],  [place] [address]  Shayne Anes, MD

## 2024-02-24 ENCOUNTER — Ambulatory Visit
Admission: RE | Admit: 2024-02-24 | Discharge: 2024-02-24 | Disposition: A | Discharge status: Home / Self Care | Source: Ambulatory Visit | Attending: Internal Medicine | Admitting: Internal Medicine

## 2024-02-24 DIAGNOSIS — R413 Other amnesia: Secondary | ICD-10-CM

## 2024-03-03 ENCOUNTER — Ambulatory Visit: Payer: Self-pay | Admitting: Neurology

## 2024-03-03 LAB — APOE ALZHEIMER'S RISK

## 2024-03-03 LAB — ATN PROFILE
A -- Beta-amyloid 42/40 Ratio: 0.108 (ref >0.102)
Beta-amyloid 40: 228.63 pg/mL
Beta-amyloid 42: 24.7 pg/mL
N -- NfL, Plasma: 2.93 pg/mL (ref 0.00–6.04)
T -- p-tau181: 1.05 pg/mL — ABNORMAL HIGH (ref 0.00–0.97)

## 2024-03-03 LAB — VITAMIN B12: Vitamin B-12: 576 pg/mL (ref 232–1245)

## 2024-03-07 ENCOUNTER — Encounter (HOSPITAL_COMMUNITY)
Admission: RE | Admit: 2024-03-07 | Discharge: 2024-03-07 | Disposition: A | Discharge status: Home / Self Care | Source: Ambulatory Visit | Attending: Neurology | Admitting: Neurology

## 2024-03-07 DIAGNOSIS — R413 Other amnesia: Secondary | ICD-10-CM | POA: Diagnosis not present

## 2024-03-07 DIAGNOSIS — E859 Amyloidosis, unspecified: Secondary | ICD-10-CM | POA: Diagnosis not present

## 2024-03-07 MED ORDER — FLORBETAPIR F 18 500-1900 MBQ/ML IV SOLN: 10.4000 | Freq: Once | INTRAVENOUS | Status: DC

## 2024-03-12 NOTE — Telephone Encounter (Signed)
 Pt would like a response to wanting to know if Anti Amyloid infusion is an option for her here.

## 2024-03-13 NOTE — Telephone Encounter (Signed)
-----   Message from Eduard SAUNDERS [facility] sent at 03/12/2024  5:21 PM EDT ----- Recommend for dr onita to review this in 2-3 weeks when she returns, then a decision about anti-amyloid medication can be considered. This is a complex decision that is best made by dr onita. -VRP ----- Message ----- From: Oneita Hoist, CMA Sent: 03/12/2024   9:07 AM EDT To: Eduard SAUNDERS Hanlon, MD  Patient called.  Patient aware. Pt was offered to schedule appt but was told that Dr. onita will not be in the office for the next 3 weeks. Pt stated that Dr. Shayne mentioned to her that there is very  aggressive drugs that they are treating alzheimers dz with and is concerned of why we cannot get her into the practice sooner than 1-3 months. She doesn't think its ok to wait 1-3 months when pcp  advised aggressive treatment asap. When asked what drug she was referring to, the pt couldn't provide a name. She stated that she would contact pcp and call us  back asap. Will route to work in  ----- Message ----- From: onita Duos, MD Sent: 03/07/2024   9:31 PM EDT To: Gna-Pod 2 Results  Give patient follow up with me in 1-3 month, If they could not wait, ok to see nP ----- Message ----- From: Interface, Rad Results In Sent: 03/07/2024   3:06 PM EDT To: Duos onita, MD

## 2024-03-13 NOTE — Telephone Encounter (Signed)
 Call to patient, agreeable for appt to discuss results and next steps.nappt scheduled 04/09/24

## 2024-04-09 ENCOUNTER — Telehealth: Payer: Self-pay

## 2024-04-09 ENCOUNTER — Encounter: Payer: Self-pay | Admitting: Neurology

## 2024-04-09 ENCOUNTER — Ambulatory Visit (INDEPENDENT_AMBULATORY_CARE_PROVIDER_SITE_OTHER): Admitting: Neurology

## 2024-04-09 VITALS — BP 139/83 | HR 71 | Ht 65.0 in | Wt 105.0 lb

## 2024-04-09 DIAGNOSIS — G3184 Mild cognitive impairment, so stated: Secondary | ICD-10-CM | POA: Diagnosis not present

## 2024-04-09 MED ORDER — MEMANTINE HCL 10 MG PO TABS: 10.0000 mg | ORAL_TABLET | Freq: Two times a day (BID) | ORAL | 11 refills | Status: AC

## 2024-04-09 NOTE — Patient Instructions (Signed)
 [email or web address]

## 2024-04-09 NOTE — Progress Notes (Signed)
 Chief Complaint  Patient presents with   [phone]    Pt is here with her Husband. Pt states she has been stable since her last appointment.       ASSESSMENT AND PLAN  Carolyn Berger is a 78 y.o. female   Cognitive impairment  MoCA examination 24/30 in September 2025  MRI of the brain September 2025 showed no acute intracranial abnormality  Laboratory evaluation showed no treatable etiology  Brain beta amyloid scan was positive,  APO E profile E3/E3  Elevated p tau 181 and pTau 217  Most consistent with mild Alzheimer's disease,  Discussed with patient and her husband Dr. Laughter treatment options, we decided proceed with [facility] prior authorization,  Continue galantamine ER 8 mg daily, add on Namenda 10 mg twice a day,    Encourage moderate exercise  Return in 6 months or earlier if needed   DIAGNOSTIC DATA (LABS, IMAGING, TESTING) - I reviewed patient records, labs, notes, testing and imaging myself where available.   MEDICAL HISTORY:  Carolyn Berger is a 78 year old female accompanied by her husband, seen in request by her primary care from [facility] Dr. Shayne, Oneil, for evaluation of memory loss, she is accompanied by her husband at today's visit February 23, 2024    History is obtained from the patient and review of electronic medical records. I personally reviewed pertinent available imaging films in PACS.   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 his company some, since 2024, she noticed mild difficulty, take her much longer to complete a task, if she is distracted, it is harder for her to get back to task, she could not remember where she left of  She remain physically active, practicing martial arts, also taking [place] online class, eat healthy diet, with intentional weight loss over the past few years,  independent of living, is not good at the direction while driving, has been like that all her life, MoCA examination 24/30 today, missed 3 out of 5 recall,   She has no family history of dementia  Laboratory evaluation July 2025: Normal CMP creatinine 0.9 CBC, hemoglobin of 13.8, LDL of 74, A1c 5.7 normal TSH, vitamin D  65.8, apolipoprotein B 74,  Sept 11 2025,  PTau217 was mildly elevated 0.23 (0-0.18),   Personally reviewed MRI of the brain without contrast from February 18, 2024 that was normal  UPDATE Apr 09 2024: She is accompanied by her husband Dr. Laughter at today's visit, we went over workup result, which showed APO E3/E3, mild elevation of P tau 181, but negative beta amyloid 42/40 ratio,  Brain amyloid scan was positive for significant cortical amyloid deposition with her history of slow decline cognitive function, most likely diagnosis is early Alzheimer's disease,  She was started on galantamine ER 8mg  daily by primary care since early October, tolerating it well, was not sure about the benefit yet  PHYSICAL EXAM:   Vitals:   04/09/24 1508  BP: 139/83  Pulse: 71  SpO2: 99%  Weight: 105 lb (47.6 kg)  Height: 5' 5 (1.651 m)   Body mass index is 17.47 kg/m.  PHYSICAL EXAMNIATION:  Gen: NAD, conversant, well nourised, well groomed                     Cardiovascular: Regular rate rhythm, no peripheral edema, warm, nontender. Eyes:  Conjunctivae clear without exudates or hemorrhage Neck: Supple, no carotid bruits. Pulmonary: Clear to auscultation bilaterally   NEUROLOGICAL EXAM:  MENTAL STATUS: Speech/cognition: Awake, alert, oriented to history taking and casual conversation    02/23/2024    3:00 PM  Montreal Cognitive Assessment   Visuospatial/ Executive (0/5) 5  Naming (0/3) 3  Attention: Read list of digits (0/2) 2  Attention: Read list of letters (0/1) 1  Attention: Serial 7 subtraction starting at 100 (0/3) 1  Language: Repeat phrase (0/2) 2  Language :  Fluency (0/1) 0  Abstraction (0/2) 2  Delayed Recall (0/5) 2  Orientation (0/6) 6  Total 24    CRANIAL NERVES: Ocular movements were full, facial symmetric, hearing was intact to casual conversation, head turn and shoulder shrug normal symmetric  MOTOR: Moves 4 extremity without difficulty  GAIT/STANCE: Posture is normal. Gait is steady   REVIEW OF SYSTEMS:  Full 14 system review of systems performed and notable only for as above All other review of systems were negative.   ALLERGIES: Allergies  Allergen Reactions   Typhoid Vaccines Rash   Lobster [Shellfish Allergy] Nausea And Vomiting    Lobster only   Celebrex  [Celecoxib ] Hives    HOME MEDICATIONS: Current Outpatient Medications  Medication Sig Dispense Refill   atorvastatin  (LIPITOR ) 80 MG tablet Take 80 mg by mouth daily. (Patient taking differently: Take 80 mg by mouth daily. 1/2)     fluticasone  (FLONASE ) 50 MCG/ACT nasal spray Place 2 sprays into both nostrils daily.     galantamine (RAZADYNE ER) 8 MG 24 hr capsule Take 8 mg by mouth daily with breakfast.     irbesartan  (AVAPRO ) 75 MG tablet Take 1 tablet (75 mg total) by mouth daily. 90 tablet 3   LUMIGAN 0.01 % SOLN Place 1 drop into both eyes at bedtime.     raloxifene  (EVISTA ) 60 MG tablet Take 60 mg by mouth every morning.      VAGIFEM 10 MCG TABS vaginal tablet Place 10 mcg vaginally once a week.   4   No current facility-administered medications for this visit.    PAST MEDICAL HISTORY: Past Medical History:  Diagnosis Date   Allergy    seasonal allergies, presently upper respiratory symptomsrunny nose, post nasal drip, rare cough_tx. Zpack   Anemia    hx of blood transfusion 1972 after childbirth hemarrahe no reaction    Arthritis    oa   Cataract    GERD (gastroesophageal reflux disease)    controls with diet and rare OTC meds   Heart murmur    mvp insufficiency   Hematoma 20 yrs ago   after breast biopsy   Hypertension    Mitral valve  prolapse    with moderate mitral regurgitation-LOV Dr. Lavona 10-22-13    PAST SURGICAL HISTORY: Past Surgical History:  Procedure Laterality Date   ANTERIOR CRUCIATE LIGAMENT REPAIR Left 20 yrs ago   BREAST BIOPSY Bilateral last 20 yrs ago   of benign lesions right x 2 left x 1   CHOLECYSTECTOMY N/A 08/02/2014   Procedure: LAPAROSCOPIC CHOLECYSTECTOMY WITH INTRAOPERATIVE CHOLANGIOGRAM;  Surgeon: Donnice KATHEE Lunger, MD;  Location: [facility];  Service: General;  Laterality: N/A;   EYE SURGERY Bilateral    2016 ioc with lens implants   left wrist contracture surgery  2018   done under local in office   minicus Right 20 yrs ago   meniscus repair right knee   SPINE SURGERY  08/13/2016   fused C4-5neck mobility issues  turning side to side since   TEE WITHOUT CARDIOVERSION  07/23/2011   Procedure: TRANSESOPHAGEAL ECHOCARDIOGRAM (TEE);  Surgeon: Ezra Shuck, MD;  Location: [facility] ENDOSCOPY;  Service: Cardiovascular;  Laterality: N/A;   TOTAL HIP ARTHROPLASTY Right 01/24/2018   Procedure: RIGHT TOTAL HIP ARTHROPLASTY ANTERIOR APPROACH;  Surgeon: Ernie Cough, MD;  Location: [facility];  Service: Orthopedics;  Laterality: Right;  70 mins   TUBAL LIGATION  1980 or 1981    FAMILY HISTORY: Family History  Problem Relation Age of Onset   Cancer Mother        breast   Cancer Father        lymphoma   Cancer Sister        uterine   Cancer Sister        Breast CA    SOCIAL HISTORY: Social History   Socioeconomic History   Marital status: Married    Spouse name: Not on file   Number of children: Not on file   Years of education: Not on file   Highest education level: Not on file  Occupational History   Not on file  Tobacco Use   Smoking status: Never   Smokeless tobacco: Never  Vaping Use   Vaping status: Never Used  Substance and Sexual Activity   Alcohol  use: Yes    Alcohol /week: 7.0 standard drinks of alcohol     Types: 7 Glasses of wine per week    Comment: 1-2 glasses of wine per  day   Drug use: Never   Sexual activity: Yes  Other Topics Concern   Not on file  Social History Narrative   Exercise karate daily for > 1 hour   Social Drivers of Corporate Investment Banker Strain: Not on file  Food Insecurity: Not on file  Transportation Needs: Not on file  Physical Activity: Not on file  Stress: Not on file  Social Connections: Not on file  Intimate Partner Violence: Not on file      Modena Callander, M.D. Ph.D.  [facility] [address] [place], [place] [address] Ph: [phone] Fax: [phone]  CC:  Shayne Anes, MD [address] [place],  [place] [address]  Shayne Anes, MD    I personally spent a total of 40 minutes in the care of the patient today including preparing to see the patient, getting/reviewing separately obtained history, performing a medically appropriate exam/evaluation, counseling and educating, placing orders, documenting clinical information in the EHR, independently interpreting results, communicating results, and coordinating care.

## 2024-04-11 DIAGNOSIS — Z23 Encounter for immunization: Secondary | ICD-10-CM | POA: Diagnosis not present

## 2024-04-19 ENCOUNTER — Ambulatory Visit: Admitting: Neurology

## 2024-04-27 DIAGNOSIS — H0102B Squamous blepharitis left eye, upper and lower eyelids: Secondary | ICD-10-CM | POA: Diagnosis not present

## 2024-04-27 DIAGNOSIS — Z961 Presence of intraocular lens: Secondary | ICD-10-CM | POA: Diagnosis not present

## 2024-04-27 DIAGNOSIS — H16142 Punctate keratitis, left eye: Secondary | ICD-10-CM | POA: Diagnosis not present

## 2024-04-27 DIAGNOSIS — H0102A Squamous blepharitis right eye, upper and lower eyelids: Secondary | ICD-10-CM | POA: Diagnosis not present

## 2024-04-27 DIAGNOSIS — H401131 Primary open-angle glaucoma, bilateral, mild stage: Secondary | ICD-10-CM | POA: Diagnosis not present

## 2024-05-23 ENCOUNTER — Telehealth: Payer: Self-pay | Admitting: *Deleted

## 2024-05-23 DIAGNOSIS — F02A0 Dementia in other diseases classified elsewhere, mild, without behavioral disturbance, psychotic disturbance, mood disturbance, and anxiety: Secondary | ICD-10-CM | POA: Insufficient documentation

## 2024-05-23 NOTE — Telephone Encounter (Signed)
° °  1st K[facility] infusion today May 23 2024,   Had baseline MRI brain Sept 19 2025 Brain Amyloid PET scan Mar 07 2024.  Ordered MRI, to be done before next infusion on Jan 19th, 2026 Orders Placed This Encounter  Procedures   MR BRAIN WO CONTRAST

## 2024-05-23 NOTE — Addendum Note (Signed)
 Addended by: Kylea Berrong on: 05/23/2024 02:17 PM   Modules accepted: Orders

## 2024-05-23 NOTE — Telephone Encounter (Signed)
 Please review. Message from [facility]

## 2024-05-23 NOTE — Telephone Encounter (Signed)
 Carolyn Berger [birth year] is having her 1st [place] today....she will need an MRI done and read before 06/25/24

## 2024-05-24 ENCOUNTER — Encounter: Payer: Self-pay | Admitting: Neurology

## 2024-05-24 NOTE — Telephone Encounter (Signed)
 I called was not able to reach patient, follow-up with her husband Dr. Merrilyn, patient suffered mild upper respiratory symptoms over the past couple days, tolerating her first Kinsula infusion well on May 23, 2024,  Around 9 May 24, 2024 complains of mild dizziness, not so sure about her gait, but function well, she is out about driving now without much difficulty  She is to continue observe the symptoms, call for worsening symptoms, should have another MRI of the brain before next infusion on January 19

## 2024-05-29 ENCOUNTER — Other Ambulatory Visit: Payer: Self-pay | Admitting: Cardiology

## 2024-05-29 ENCOUNTER — Encounter: Payer: Self-pay | Admitting: Cardiology

## 2024-06-06 ENCOUNTER — Ambulatory Visit
Admission: RE | Admit: 2024-06-06 | Discharge: 2024-06-06 | Disposition: A | Discharge status: Home / Self Care | Source: Ambulatory Visit | Attending: Neurology | Admitting: Neurology

## 2024-06-06 DIAGNOSIS — G309 Alzheimer's disease, unspecified: Secondary | ICD-10-CM

## 2024-06-11 ENCOUNTER — Ambulatory Visit: Payer: Self-pay | Admitting: Neurology

## 2024-06-25 ENCOUNTER — Telehealth: Payer: Self-pay | Admitting: Neurology

## 2024-06-25 DIAGNOSIS — G309 Alzheimer's disease, unspecified: Secondary | ICD-10-CM

## 2024-06-25 NOTE — Telephone Encounter (Signed)
 Pt called stating that she has had her infusion and not  she need s another MRI scheduled for Next infusion. Pt stated that she would like to  got  to [facility] location for MRI

## 2024-06-25 NOTE — Telephone Encounter (Signed)
 I called and failed to reach patient and her husband, left message, please call again  She has her 1st Kinsula infusion on Dec 17th 2025, had MRI brain Jun 06 2024, that was OK  She should be Ok to proceed next kinsula infusion (? On schedule for next infusion on Jan 19th 2026)  I have ordered repeat MRI brain before 3rd infusion on Feb 20th 2026.

## 2024-06-25 NOTE — Telephone Encounter (Signed)
 Dr.Yan I called patient she had her Carolyn Berger infusion on 06/21/24. Pt said she would like to have MRI order placed at [facility] location. Patient states she needs another MRI prior to her next infusion.   Please advise

## 2024-06-25 NOTE — Telephone Encounter (Signed)
 Dr.Yan per patient her infusion are:  1st- infusion on Dec. 17, 2025 2nd -infusion Jun 21, 2024 3rd -infusion scheduled on Jul 19, 2024   Patient said she will need MRI scheduled before her infusion date on Jul 19, 2024.

## 2024-06-26 NOTE — Telephone Encounter (Signed)
 Patient scheduled on 07/02/24 for Mri patient aware.

## 2024-06-26 NOTE — Telephone Encounter (Signed)
 Order sent to [facility] to schedule. [phone]

## 2024-06-29 ENCOUNTER — Ambulatory Visit (HOSPITAL_COMMUNITY)
Admission: RE | Admit: 2024-06-29 | Discharge: 2024-06-29 | Disposition: A | Payer: Medicare Other | Source: Ambulatory Visit | Attending: Cardiology | Admitting: Cardiology

## 2024-06-29 DIAGNOSIS — I340 Nonrheumatic mitral (valve) insufficiency: Secondary | ICD-10-CM | POA: Diagnosis present

## 2024-06-29 DIAGNOSIS — I10 Essential (primary) hypertension: Secondary | ICD-10-CM | POA: Diagnosis present

## 2024-06-29 DIAGNOSIS — I351 Nonrheumatic aortic (valve) insufficiency: Secondary | ICD-10-CM | POA: Insufficient documentation

## 2024-06-29 LAB — ECHOCARDIOGRAM COMPLETE
AV Mean grad: 7 mmHg
AV Peak grad: 13.1 mmHg
Ao pk vel: 1.81 m/s
Area-P 1/2: 3.42 cm2
Calc EF: 60.2 %
MV M vel: 5.83 m/s
MV Peak grad: 136 mmHg
P 1/2 time: 431 ms
S' Lateral: 2.2 cm
Single Plane A2C EF: 62 %
Single Plane A4C EF: 56.7 %

## 2024-07-01 ENCOUNTER — Ambulatory Visit: Payer: Self-pay | Admitting: Cardiology

## 2024-07-02 ENCOUNTER — Other Ambulatory Visit

## 2024-07-02 NOTE — Progress Notes (Unsigned)
" °  Cardiology Office Note:   Date:  07/03/2024  ID:  Carolyn Berger, DOB 06-Sep-1945, MRN [id number] PCP: Shayne Anes, MD  [facility] Providers Cardiologist:  Lynwood Schilling, MD {  History of Present Illness:   Carolyn Berger is a 79 y.o. female who presents for follow up of MVP.   She had mild to moderate MR and moderate AI on last echo in Jan 2026.    She has a slow cognitive decline and is being managed for possible amyloid with slow onset Alzheimer's dementia.  She is still going to [employer] 5 days per week.  The patient denies any new symptoms such as chest discomfort, neck or arm discomfort. There has been no new shortness of breath, PND or orthopnea. There have been no reported palpitations, presyncope or syncope.   ROS: As stated in the HPI and negative for all other systems.  Studies Reviewed:    EKG:   EKG Interpretation Date/Time:  Tuesday July 03 2024 13:30:16 EST Ventricular Rate:  83 PR Interval:  168 QRS Duration:  76 QT Interval:  380 QTC Calculation: 446 R Axis:   -15  Text Interpretation: Sinus rhythm with Premature supraventricular complexes Left atrial enlargement Poor anterior R wave progression When compared with ECG of 01-Jul-2023 10:34, Premature supraventricular complexes are now Present No significant change since last tracing Confirmed by Schilling Rattan ([id number]) on 07/03/2024 1:31:52 PM     Risk Assessment/Calculations:              Physical Exam:   VS:  BP 100/60 (BP Location: Left Arm, Patient Position: Sitting, Cuff Size: Normal)   Pulse 82   Ht 5' 5 (1.651 m)   Wt 105 lb 1.6 oz (47.7 kg)   SpO2 97%   BMI 17.49 kg/m    Wt Readings from Last 3 Encounters:  07/03/24 105 lb 1.6 oz (47.7 kg)  04/09/24 105 lb (47.6 kg)  02/23/24 106 lb (48.1 kg)     GEN: Well nourished, well developed in no acute distress NECK: No JVD; No carotid bruits CARDIAC: RRR, 3/6 apical and axillary late systolic murmur, no diastolic murmurs, rubs,  gallops RESPIRATORY:  Clear to auscultation without rales, wheezing or rhonchi  ABDOMEN: Soft, non-tender, non-distended EXTREMITIES:  No edema; No deformity   ASSESSMENT AND PLAN:   MITRAL REGURGITATION:    Mild to moderate.  We can continue to watch this.  She has no symptoms and has had no change in the TEE.  I don't think we are missing more severe disease without TEE so I will follow with TTE in one year.   AORTIC INSUFFICIENCY: This remains moderate on echo.  This will be followed as above.  She has no symptoms and normal LV size and function.    HTN:   The blood pressure is low today but usually well controlled.  No change in therapy.      Follow up with me in one year after repeat echo.   Signed, Lynwood Schilling, MD   "

## 2024-07-03 ENCOUNTER — Ambulatory Visit: Attending: Cardiology | Admitting: Cardiology

## 2024-07-03 ENCOUNTER — Encounter: Payer: Self-pay | Admitting: Cardiology

## 2024-07-03 VITALS — BP 100/60 | HR 82 | Ht 65.0 in | Wt 105.1 lb

## 2024-07-03 DIAGNOSIS — I351 Nonrheumatic aortic (valve) insufficiency: Secondary | ICD-10-CM | POA: Insufficient documentation

## 2024-07-03 DIAGNOSIS — I10 Essential (primary) hypertension: Secondary | ICD-10-CM | POA: Insufficient documentation

## 2024-07-03 DIAGNOSIS — I340 Nonrheumatic mitral (valve) insufficiency: Secondary | ICD-10-CM | POA: Diagnosis not present

## 2024-07-03 NOTE — Patient Instructions (Signed)
 Medication Instructions:  Your physician recommends that you continue on your current medications as directed. Please refer to the Current Medication list given to you today.  *If you need a refill on your cardiac medications before your next appointment, please call your pharmacy*  Lab Work: NONE If you have labs (blood work) drawn today and your tests are completely normal, you will receive your results only by: MyChart Message (if you have MyChart) OR A paper copy in the mail If you have any lab test that is abnormal or we need to change your treatment, we will call you to review the results.  Testing/Procedures: Echocardiogram in 1 year Your physician has requested that you have an echocardiogram. Echocardiography is a painless test that uses sound waves to create images of your heart. It provides your doctor with information about the size and shape of your heart and how well your hearts chambers and valves are working. This procedure takes approximately one hour. There are no restrictions for this procedure. Please do NOT wear cologne, perfume, aftershave, or lotions (deodorant is allowed). Please arrive 15 minutes prior to your appointment time.  Please note: We ask at that you not bring children with you during ultrasound (echo/ vascular) testing. Due to room size and safety concerns, children are not allowed in the ultrasound rooms during exams. Our front office staff cannot provide observation of children in our lobby area while testing is being conducted. An adult accompanying a patient to their appointment will only be allowed in the ultrasound room at the discretion of the ultrasound technician under special circumstances. We apologize for any inconvenience.   Follow-Up: At [facility], you and your health needs are our priority.  As part of our continuing mission to provide you with exceptional heart care, our providers are all part of one team.  This team includes your  primary Cardiologist (physician) and Advanced Practice Providers or APPs (Physician Assistants and Nurse Practitioners) who all work together to provide you with the care you need, when you need it.  Your next appointment:   1 year(s)  Provider:   Lynwood Schilling, MD    We recommend signing up for the patient portal called MyChart.  Sign up information is provided on this After Visit Summary.  MyChart is used to connect with patients for Virtual Visits (Telemedicine).  Patients are able to view lab/test results, encounter notes, upcoming appointments, etc.  Non-urgent messages can be sent to your provider as well.   To learn more about what you can do with MyChart, go to [email or web address].   Other Instructions

## 2024-07-05 ENCOUNTER — Ambulatory Visit: Payer: Self-pay | Admitting: Neurology

## 2024-07-05 ENCOUNTER — Ambulatory Visit
Admission: RE | Admit: 2024-07-05 | Discharge: 2024-07-05 | Disposition: A | Discharge status: Home / Self Care | Source: Ambulatory Visit | Attending: Neurology | Admitting: Neurology

## 2024-07-05 DIAGNOSIS — G309 Alzheimer's disease, unspecified: Secondary | ICD-10-CM

## 2024-07-17 ENCOUNTER — Ambulatory Visit: Admitting: Neurology

## 2024-07-18 ENCOUNTER — Ambulatory Visit: Admitting: Diagnostic Neuroimaging

## 2024-10-08 ENCOUNTER — Ambulatory Visit: Admitting: Neurology

## 2025-07-03 ENCOUNTER — Ambulatory Visit (HOSPITAL_COMMUNITY)
